# Patient Record
Sex: Female | Born: 1935 | Race: Black or African American | Hispanic: No | Smoking: Never smoker
Health system: Southern US, Community
[De-identification: ages and names within clinical notes are randomized; demographics above are authoritative.]

## PROBLEM LIST (undated history)

## (undated) DIAGNOSIS — IMO0002 Reserved for concepts with insufficient information to code with codable children: Secondary | ICD-10-CM

## (undated) DIAGNOSIS — I272 Pulmonary hypertension, unspecified: Secondary | ICD-10-CM

## (undated) DIAGNOSIS — D649 Anemia, unspecified: Secondary | ICD-10-CM

## (undated) DIAGNOSIS — Q211 Atrial septal defect, unspecified: Secondary | ICD-10-CM

## (undated) DIAGNOSIS — I10 Essential (primary) hypertension: Secondary | ICD-10-CM

## (undated) DIAGNOSIS — M549 Dorsalgia, unspecified: Secondary | ICD-10-CM

## (undated) DIAGNOSIS — R609 Edema, unspecified: Secondary | ICD-10-CM

## (undated) DIAGNOSIS — M109 Gout, unspecified: Secondary | ICD-10-CM

## (undated) DIAGNOSIS — N189 Chronic kidney disease, unspecified: Secondary | ICD-10-CM

## (undated) DIAGNOSIS — R9430 Abnormal result of cardiovascular function study, unspecified: Secondary | ICD-10-CM

## (undated) DIAGNOSIS — M539 Dorsopathy, unspecified: Secondary | ICD-10-CM

## (undated) DIAGNOSIS — K5792 Diverticulitis of intestine, part unspecified, without perforation or abscess without bleeding: Secondary | ICD-10-CM

## (undated) DIAGNOSIS — E039 Hypothyroidism, unspecified: Secondary | ICD-10-CM

## (undated) DIAGNOSIS — G8929 Other chronic pain: Secondary | ICD-10-CM

## (undated) DIAGNOSIS — I639 Cerebral infarction, unspecified: Secondary | ICD-10-CM

## (undated) HISTORY — DX: Hypothyroidism, unspecified: E03.9

## (undated) HISTORY — DX: Abnormal result of cardiovascular function study, unspecified: R94.30

## (undated) HISTORY — PX: ABDOMINAL HYSTERECTOMY: SHX81

## (undated) HISTORY — DX: Essential (primary) hypertension: I10

## (undated) HISTORY — PX: CATARACT EXTRACTION: SUR2

## (undated) HISTORY — DX: Pulmonary hypertension, unspecified: I27.20

## (undated) HISTORY — PX: BREAST SURGERY: SHX581

## (undated) HISTORY — DX: Atrial septal defect, unspecified: Q21.10

## (undated) HISTORY — DX: Reserved for concepts with insufficient information to code with codable children: IMO0002

## (undated) HISTORY — DX: Atrial septal defect: Q21.1

## (undated) HISTORY — DX: Edema, unspecified: R60.9

---

## 2002-08-04 ENCOUNTER — Encounter: Payer: Self-pay | Admitting: Internal Medicine

## 2002-08-04 ENCOUNTER — Ambulatory Visit (HOSPITAL_COMMUNITY): Admission: RE | Admit: 2002-08-04 | Discharge: 2002-08-04 | Payer: Self-pay | Admitting: Internal Medicine

## 2006-04-12 ENCOUNTER — Ambulatory Visit: Payer: Self-pay | Admitting: Cardiology

## 2006-04-19 ENCOUNTER — Encounter: Payer: Self-pay | Admitting: Cardiology

## 2006-04-19 ENCOUNTER — Ambulatory Visit: Payer: Self-pay | Admitting: Cardiology

## 2006-05-18 ENCOUNTER — Ambulatory Visit: Payer: Self-pay | Admitting: Cardiology

## 2006-05-23 ENCOUNTER — Ambulatory Visit: Payer: Self-pay | Admitting: Cardiology

## 2006-06-06 ENCOUNTER — Ambulatory Visit: Payer: Self-pay | Admitting: Cardiology

## 2006-06-25 ENCOUNTER — Ambulatory Visit: Payer: Self-pay | Admitting: Cardiology

## 2006-07-30 ENCOUNTER — Ambulatory Visit: Payer: Self-pay | Admitting: Cardiology

## 2006-08-20 ENCOUNTER — Ambulatory Visit: Payer: Self-pay | Admitting: Cardiology

## 2006-10-02 ENCOUNTER — Ambulatory Visit: Payer: Self-pay | Admitting: Cardiology

## 2006-12-06 ENCOUNTER — Ambulatory Visit: Payer: Self-pay | Admitting: Cardiology

## 2006-12-31 ENCOUNTER — Ambulatory Visit: Payer: Self-pay | Admitting: Cardiology

## 2007-04-01 ENCOUNTER — Ambulatory Visit: Payer: Self-pay | Admitting: Cardiology

## 2007-04-15 ENCOUNTER — Ambulatory Visit: Payer: Self-pay | Admitting: Cardiology

## 2007-04-15 ENCOUNTER — Encounter: Payer: Self-pay | Admitting: Cardiology

## 2007-05-16 ENCOUNTER — Ambulatory Visit: Payer: Self-pay | Admitting: Cardiology

## 2007-07-26 ENCOUNTER — Encounter: Payer: Self-pay | Admitting: Cardiology

## 2007-12-06 ENCOUNTER — Ambulatory Visit: Payer: Self-pay | Admitting: Cardiology

## 2008-08-03 ENCOUNTER — Ambulatory Visit: Payer: Self-pay | Admitting: Cardiology

## 2008-08-07 ENCOUNTER — Encounter: Payer: Self-pay | Admitting: Cardiology

## 2009-02-08 ENCOUNTER — Encounter: Payer: Self-pay | Admitting: Cardiology

## 2009-03-10 ENCOUNTER — Ambulatory Visit: Payer: Self-pay | Admitting: Cardiology

## 2009-05-21 ENCOUNTER — Encounter: Payer: Self-pay | Admitting: Cardiology

## 2009-05-24 ENCOUNTER — Encounter: Payer: Self-pay | Admitting: Cardiology

## 2009-09-29 ENCOUNTER — Ambulatory Visit: Payer: Self-pay | Admitting: Cardiology

## 2010-02-14 ENCOUNTER — Encounter: Payer: Self-pay | Admitting: Cardiology

## 2010-03-10 ENCOUNTER — Ambulatory Visit: Payer: Self-pay | Admitting: Cardiology

## 2010-08-17 ENCOUNTER — Ambulatory Visit: Payer: Self-pay | Admitting: Cardiology

## 2010-10-04 NOTE — Assessment & Plan Note (Signed)
Summary: 6 mo fu reminder-srs   Visit Type:  Follow-up Primary Provider:  Dr. Lucianne Lei   History of Present Illness: The patient is seen for followup of history of a cardiomyopathy in the past.  In 2007 her ejection fraction was 25-30%.  This normalized.  Ejection fraction was 55-60% in August, 2008.  No repeat echo has been done since then.  The patient is doing well.  She follows carefully with Dr.McKay.  He is followed her labs.  She's had some mild edema and she relates to me that he tried to push her diuretics.  He felt it was best to not increase the diuretics.  She has peripheral edema that is probably due to venous insufficiency.  She is 77 and looks 77.  She is active.  She is not having any significant shortness of breath.  She mentions that she feels cool.  Her thyroid is being followed carefully.  Preventive Screening-Counseling & Management  Alcohol-Tobacco     Smoking Status: quit     Year Started: 1946 smoked     Year Quit: 1946  Current Medications (verified): 1)  Alprazolam 0.5 Mg Tabs (Alprazolam) .... Take 1 Tablet By Mouth Two Times A Day 2)  Caltrate 600+d 600-400 Mg-Unit Tabs (Calcium Carbonate-Vitamin D) .... Take 1 Tablet By Mouth Once A Day 3)  Coreg 25 Mg Tabs (Carvedilol) .... Take 1 Tablet By Mouth Twice A Day 4)  Furosemide 40 Mg Tabs (Furosemide) .... Take 1 Tablet By Mouth Twice A Day 5)  Hydralazine Hcl 25 Mg Tabs (Hydralazine Hcl) .... Take 1 Tablet By Mouth Twice A Day 6)  Levothyroxine Sodium 75 Mcg Tabs (Levothyroxine Sodium) .... Take 1 Tablet By Mouth Once A Day 7)  Lisinopril 20 Mg Tabs (Lisinopril) .... Take 1 Tablet By Mouth Once A Day 8)  Multivitamins  Tabs (Multiple Vitamin) .... Take 1 Tablet By Mouth Once A Day  Allergies (verified): No Known Drug Allergies  Comments:  Nurse/Medical Assistant: The patient's medication bottles and allergies were reviewed with the patient and were updated in the Medication and Allergy  Lists.  Past History:  Past Medical History: nonischemic cardiomyopathy ... EF 25-30% in 2007 ... normalized LVF (EF 55-60%) by 2-D echo, 8/08 ... Adenosine Cardiolite, 8/07: fixed antero/antero-apical defect; no definite ischemia, consistent with NICM Pulmonary hypertension Small ASD HTN Chronic LE edema...venous insufficiency Hypothyroidism  Social History: Smoking Status:  quit  Review of Systems       Patient denies fever, chills, headache, sweats, rash, change in vision, change in hearing, chest pain, cough, nausea vomiting, urinary symptoms.  All other systems are reviewed and are negative  Vital Signs:  Patient profile:   77 year old female Height:      65 inches Weight:      166 pounds Pulse rate:   71 / minute BP sitting:   135 / 71  (left arm) Cuff size:   regular  Vitals Entered By: Carlye Grippe (March 10, 2010 2:14 PM)  Physical Exam  General:  patient looks quite stable. Eyes:  no xanthelasma. Neck:  no jugular venous distention. Lungs:  lungs are clear.  Respiratory effort is nonlabored. Heart:  cardiac exam reveals S1-S2.  No clicks or significant murmurs. Abdomen:  abdomen is soft. Extremities:  the patient does have 1+ peripheral edema. Psych:  patient is oriented to person time and place.  Affect is normal.   Impression & Recommendations:  Problem # 1:  SHORTNESS OF BREATH (ICD-786.05)  Her updated medication list for this problem includes:    Coreg 25 Mg Tabs (Carvedilol) .Marland Kitchen... Take 1 tablet by mouth twice a day    Furosemide 40 Mg Tabs (Furosemide) .Marland Kitchen... Take 1 tablet by mouth twice a day    Lisinopril 20 Mg Tabs (Lisinopril) .Marland Kitchen... Take 1 tablet by mouth once a day The patient is not having any significant shortness of breath.  No change in her meds.  Problem # 2:  HYPERTENSION (ICD-401.9)  Her updated medication list for this problem includes:    Coreg 25 Mg Tabs (Carvedilol) .Marland Kitchen... Take 1 tablet by mouth twice a day    Furosemide 40 Mg  Tabs (Furosemide) .Marland Kitchen... Take 1 tablet by mouth twice a day    Hydralazine Hcl 25 Mg Tabs (Hydralazine hcl) .Marland Kitchen... Take 1 tablet by mouth twice a day    Lisinopril 20 Mg Tabs (Lisinopril) .Marland Kitchen... Take 1 tablet by mouth once a day  Orders: EKG w/ Interpretation (93000) Blood pressure is slightly elevated.  I've chosen not to change her medicines today.  Problem # 3:  * CARDIOMYOPATHY IMPROVED  Orders: EKG w/ Interpretation (93000) Fortunately her cardiomyopathy improved.  Last echo was done in 2008.  I do not think that P. echo will be helpful at this time.  EKG is done today reviewed by me.  She has normal sinus rhythm.  There scattered PVCs.  No further workup indicated.  Problem # 4:  HYPOTHYROIDISM (ICD-244.9)  Her updated medication list for this problem includes:    Levothyroxine Sodium 75 Mcg Tabs (Levothyroxine sodium) .Marland Kitchen... Take 1 tablet by mouth once a day Patient's thyroid status is being treated.  Problem # 5:  * CHRONIC LEG EDEMA There is chronic mild edema.  This is from venous insufficiency.  No change in therapy.  Patient Instructions: 1)  Your physician wants you to follow-up in: 6 months. You will receive a reminder letter in the mail one-two months in advance. If you don't receive a letter, please call our office to schedule the follow-up appointment. 2)  Your physician recommends that you continue on your current medications as directed. Please refer to the Current Medication list given to you today.

## 2010-10-04 NOTE — Assessment & Plan Note (Signed)
Summary: 6 MONTH FU RECV LETTER VS   Visit Type:  Follow-up Primary Provider:  Dr. Lucianne Lei  CC:  shortness of breath.  History of Present Illness: [name] is seen for cardiology followup.  She has decreased left ventricular function by history but this appeared to have normalized in August 2008.  She's doing well.  He is fully active.  She is not having chest pain.  She does have some exertional shortness of breath but this has not changed significantly.  Preventive Screening-Counseling & Management  Alcohol-Tobacco     Smoking Status: quit > 6 months  Comments: [name] states she tried smoking for about 6 months when she was 22 but never smoked regularly.  Current Medications (verified): 1)  Alprazolam 0.5 Mg Tabs (Alprazolam) .... Take 1 Tablet By Mouth As Needed 2)  Calcium 500 Mg Tabs (Calcium Carbonate) .... Take 1 Tablet By Mouth Once A Day 3)  Coreg 25 Mg Tabs (Carvedilol) .... Take 1 Tablet By Mouth Twice A Day 4)  Furosemide 40 Mg Tabs (Furosemide) .... Take 1 Tablet By Mouth Twice A Day 5)  Hydralazine Hcl 25 Mg Tabs (Hydralazine Hcl) .... Take 1 Tablet By Mouth Twice A Day 6)  Levothyroxine Sodium 75 Mcg Tabs (Levothyroxine Sodium) .... Take 1 Tablet By Mouth Once A Day 7)  Lisinopril 20 Mg Tabs (Lisinopril) .... Take 1 Tablet By Mouth Once A Day 8)  Multivitamins  Tabs (Multiple Vitamin) .... Take 1 Tablet By Mouth Once A Day  Comments:  Nurse/Medical Assistant: The patient's medications were reviewed with the patient and were updated in the Medication List. [name] brought medication bottles to office visit.  Cyril Loosen, RN, BSN (September 29, 2009 1:02 PM)  Past History:  Past Medical History: Last updated: 09/29/2009 nonischemic cardiomyopathy ... EF 25-30% in 2007 ... normalized LVF (EF 55-60%) by 2-D echo, 8/08 ... Adenosine Cardiolite, 8/07: fixed antero/antero-apical defect; no definite ischemia, consistent with NICM Pulmonary hypertension Small ASD HTN Chronic  LE edema Hypothyroidism  Social History: Smoking Status:  quit > 6 months  Review of Systems       Patient denies fever, chills, headache, sweats, rash, change in vision, change in hearing, chest pain, cough, nausea vomiting, urinary symptoms.  All other systems are reviewed and are negative.  Vital Signs:  Patient profile:   77 year old female Height:      65 inches Weight:      165 pounds BMI:     27.56 Pulse rate:   67 / minute BP sitting:   159 / 79  (left arm) Cuff size:   regular  Vitals Entered By: Cyril Loosen, RN, BSN (September 29, 2009 12:59 PM)  Nutrition Counseling: Patient's BMI is greater than 25 and therefore counseled on weight management options. CC: shortness of breath Comments [name] states she had one episode of chest pain but she had a cold and primary MD told her it may be from coughing.   Physical Exam  General:  patient is stable in general. Eyes:  no xanthelasma. Neck:  no jugular venous distention. Lungs:  lungs are clear.  Respiratory effort is nonlabored. Heart:  cardiac exam reveals S1-S2.  No clicks or significant murmurs. Abdomen:  abdomen is soft. Extremities:  no peripheral edema. Psych:  patient is oriented to person time and place.  Affect is normal.   Impression & Recommendations:  Problem # 1:  HYPERTENSION (ICD-401.9)  Her updated medication list for this problem includes:    Coreg 25  Mg Tabs (Carvedilol) .Marland Kitchen... Take 1 tablet by mouth twice a day    Furosemide 40 Mg Tabs (Furosemide) .Marland Kitchen... Take 1 tablet by mouth twice a day    Hydralazine Hcl 25 Mg Tabs (Hydralazine hcl) .Marland Kitchen... Take 1 tablet by mouth twice a day    Lisinopril 20 Mg Tabs (Lisinopril) .Marland Kitchen... Take 1 tablet by mouth once a day Hypertension is under control.  No further workup.  Problem # 2:  * CARDIOMYOPATHY IMPROVED historically the patient's LV function improved.  I'm not inclined to recommend an echo at this time.  Problem # 3:  SHORTNESS OF BREATH  (ICD-786.05)  Her updated medication list for this problem includes:    Coreg 25 Mg Tabs (Carvedilol) .Marland Kitchen... Take 1 tablet by mouth twice a day    Furosemide 40 Mg Tabs (Furosemide) .Marland Kitchen... Take 1 tablet by mouth twice a day    Lisinopril 20 Mg Tabs (Lisinopril) .Marland Kitchen... Take 1 tablet by mouth once a day Patient is having some mild shortness of breath.  She seems stable and I feel no further workup is needed.  Other Orders: EKG w/ Interpretation (93000)  Patient Instructions: 1)  Your physician recommends that you continue on your current medications as directed. Please refer to the Current Medication list given to you today. 2)  Your physician wants you to follow-up in:  6months. You will receive a reminder letter in the mail about two months in advance. If you don't receive a letter, please call our office to schedule the follow-up appointment.

## 2010-10-06 NOTE — Assessment & Plan Note (Signed)
Summary: 6 MOF U PER JAN REMINDER   Visit Type:  Follow-up Primary Provider:  Dr. Lucianne Lei  CC:  cardiomyopathy.  History of Present Illness: The patient looks great.  She had left ventricular dysfunction in the past improved.  Her last echo was in 2008 with an EF of 50%.  She is on appropriate medications.  Her systolic blood pressure is elevated today.  He was not elevated when she saw her primary physician very recently.  She will see him again for early followup.  Preventive Screening-Counseling & Management  Alcohol-Tobacco     Smoking Status: never  Current Medications (verified): 1)  Alprazolam 0.5 Mg Tabs (Alprazolam) .... Take 1 Tablet By Mouth Two Times A Day 2)  Caltrate 600+d 600-400 Mg-Unit Tabs (Calcium Carbonate-Vitamin D) .... Take 1 Tablet By Mouth Once A Day 3)  Coreg 25 Mg Tabs (Carvedilol) .... Take 1 Tablet By Mouth Twice A Day 4)  Furosemide 40 Mg Tabs (Furosemide) .... Take 1 Tablet By Mouth Twice A Day 5)  Hydralazine Hcl 25 Mg Tabs (Hydralazine Hcl) .... Take 1 Tablet By Mouth Twice A Day 6)  Levothyroxine Sodium 75 Mcg Tabs (Levothyroxine Sodium) .... Take 1 Tablet By Mouth Once A Day 7)  Lisinopril 20 Mg Tabs (Lisinopril) .... Take 1 Tablet By Mouth Once A Day 8)  Multivitamins  Tabs (Multiple Vitamin) .... Take 1 Tablet By Mouth Once A Day  Allergies (verified): No Known Drug Allergies  Comments:  Nurse/Medical Assistant: The patient is currently on medications but does not know the name or dosage at this time. Instructed to contact our office with details. Will update medication list at that time.  Past History:  Past Medical History: nonischemic cardiomyopathy ... EF 25-30% in 2007 ... normalized LVF (EF 55-60%) by 2-D echo, 8/08 ... Adenosine Cardiolite, 8/07: fixed antero/antero-apical defect; no definite ischemia, consistent with NICM Pulmonary hypertension Small ASD HTN Chronic LE edema...venous insufficiency.. Hypothyroidism  Social  History: Smoking Status:  never  Review of Systems       Patient denies fever, chills, headache, sweats, rash, change in vision, change in hearing, chest pain, cough, nausea vomiting, urinary symptoms.  All other systems are reviewed and are negative.  Vital Signs:  Patient profile:   77 year old female Height:      65 inches Weight:      165 pounds BMI:     27.56 Pulse rate:   65 / minute BP sitting:   167 / 77  (left arm) Cuff size:   regular  Vitals Entered By: Carlye Grippe (August 17, 2010 1:15 PM)  Physical Exam  General:  she looks good today. Eyes:  no xanthelasma. Neck:  no jugular venous distention. Lungs:  lungs are clear.  Respiratory effort is nonlabored. Heart:  cardiac exam reveals S1 and S2.  No clicks or significant murmurs. Abdomen:  abdomen is soft. Extremities:  no peripheral edema. Psych:  patient is oriented to person time and place.  Affect is normal.   Impression & Recommendations:  Problem # 1:  * CHRONIC LEG EDEMA Is no significant edema.  She is doing well.  Problem # 2:  SHORTNESS OF BREATH (ICD-786.05)  Her updated medication list for this problem includes:    Coreg 25 Mg Tabs (Carvedilol) .Marland Kitchen... Take 1 tablet by mouth twice a day    Furosemide 40 Mg Tabs (Furosemide) .Marland Kitchen... Take 1 tablet by mouth twice a day    Lisinopril 20 Mg Tabs (Lisinopril) .Marland Kitchen... Take  1 tablet by mouth once a day She has no shortness of breath.  No change in therapy.  Problem # 3:  * CARDIOMYOPATHY IMPROVED In the past she had LV dysfunction.  This normalized.  I decided it is not needed to repeat an echo at this point.  Problem # 4:  HYPERTENSION (ICD-401.9)  Her updated medication list for this problem includes:    Coreg 25 Mg Tabs (Carvedilol) .Marland Kitchen... Take 1 tablet by mouth twice a day    Furosemide 40 Mg Tabs (Furosemide) .Marland Kitchen... Take 1 tablet by mouth twice a day    Hydralazine Hcl 25 Mg Tabs (Hydralazine hcl) .Marland Kitchen... Take 1 tablet by mouth twice a day     Lisinopril 20 Mg Tabs (Lisinopril) .Marland Kitchen... Take 1 tablet by mouth once a day Systolic blood pressure is elevated today.  She will follow up with her primary physician.we'll see back in 6 months.  Patient Instructions: 1)  Your physician recommends that you continue on your current medications as directed. Please refer to the Current Medication list given to you today. 2)  Follow up in  6 months

## 2011-01-17 NOTE — Assessment & Plan Note (Signed)
[facility]                          [facility] NOTE   NAME:Embree, LACHINA SALSBERRY                     MRN:          [id number]  DATE:08/03/2008                            DOB:          06-19-1925    Ms. Occhipinti looks great.  She had a weekend shopping spree and had her  feet down while riding in a bus.  She may have gained a little bit of  fluid weight, although she is stable.  She is not having chest pain or  shortness of breath.  She has a cardiomyopathy and this is stable.  She  has significant valvular disease.  Overall though, she is doing very  well.  She is on the appropriate medications.   PAST MEDICAL HISTORY:   ALLERGIES:  No known drug allergies.   MEDICATIONS:  See the flow sheet.   REVIEW OF SYSTEMS:  She is not having any GI or GU symptoms.  She has no  fevers or chills or musculoskeletal pain.  Review of systems otherwise  is negative.   PHYSICAL EXAMINATION:  VITAL SIGNS:  Blood pressure 126/69 with a pulse  of 65.  Weight is 168 pounds.  GENERAL:  The patient is oriented to person, time, and place.  Affect is  normal.  HEENT:  No xanthelasma.  She has normal extraocular motion.  NECK:  There are no carotid bruits.  There is no jugular venous  distention.  LUNGS:  Clear.  Respiratory effort is not labored.  CARDIAC:  An S1 with an S2.  There is a 2/6 murmur.  ABDOMEN:  Soft.  EXTREMITIES:  She has trace peripheral edema.   EKG shows sinus rhythm.   PROBLEMS:  1. Nonischemic cardiomyopathy in the past.  However, her ejection      fraction improved to 55-60%.  2. Mild mitral regurgitation, and then in followup, she had only trace      mitral regurgitation.  3. Moderate tricuspid regurgitation which then improved to mild.  4. History of pulmonary hypertension which also appeared to improve.  5. History of a small atrial septal defect.  6. Mild edema, stable.  7. Hypertension, treated.   Her cardiac status is  stable.  She needs no change in her meds and no  further workup.  I will see her in 6 months.     Luis Abed, MD, [facility]  Electronically Signed    JDK/MedQ  DD: 08/03/2008  DT: 08/04/2008  Job #: [id number]   cc:   Dorise Hiss, M.D.

## 2011-01-17 NOTE — Assessment & Plan Note (Signed)
[facility]                          [facility] NOTE   NAME:Quizon, LEZLY RUMPF                     MRN:          [id number]  DATE:04/01/2007                            DOB:          1924/10/11    HISTORY OF PRESENT ILLNESS:  Ms. Shain is doing well.  She was seen  last in the office by Dr. Andee Lineman on December 31, 2006.  He adjusted her  medications further, and she is now back for follow up.  She did have a  B-met after that visit.  Her creatinine was 1 and her BUN was 17.  She  has a cardiomyopathy.  She is now on Coreg 25 b.i.d., Lisinopril 20 and  hydralazine.  Her Lasix dose is 40 on some days and 20 on other days  depending on her volume status.  Her weight has decreased somewhat since  her last visit.  She still has some peripheral edema, but I believe that  this will be her baseline.   PAST MEDICAL HISTORY:   ALLERGIES:  No known drug allergies.   MEDICATIONS:  1. Hydralazine 25 b.i.d.  2. Thyroid 75.  3. Calcium.  4. Lasix 40 on  most days and 20 on other days.  5. Coreg 25 mg b.i.d.  6. Lisinopril 20.   OTHER MEDICAL PROBLEMS:  See the list below.   REVIEW OF SYSTEMS:  Overall, she feels well.  She is not having  significant symptoms at this time.  When I mentioned to her that her  weight was somewhat decreased she was actually concerned.  Her diet  appears to be good.  Otherwise, her review of systems is negative.   PHYSICAL EXAMINATION:  VITAL SIGNS:  Blood pressure today is 123/65,  pulse 59, weight 166.8 pounds.  GENERAL:  The patient is oriented to person, place and time.  Affect is  normal.  She is comfortable in the room at this time.  HEENT:  No xanthelasma.  She has normal extraocular muscle.  There are  no carotid bruits.  There is no jugular venous distention.  LUNGS:  Clear.  RESPIRATORY:  Effort is not labored.  CARDIAC:  S1, S2.  There are no clicks or significant murmurs.  ABDOMEN:  Soft.  Normal bowel  sounds.  She has 1+ edema in her left leg.  She does have chronic venous insufficiency in this leg.  There is trace  edema in her right leg.   PROBLEMS:  1. History of nonischemic cardiomyopathy.  EF has been as low as 25 in      the past.  Her medications have now been adjusted, and it is time      for her to have a follow up 2D echo.  2. Moderate mitral regurgitation.  3. Mild to moderate tricuspid regurgitation.  4. Pulmonary hypertension.  5. Small atrial septal defect.  6. Mild continued edema.  I will not be changing her medications.  7. Hypertension.  This is controlled at this time.  8. Weight.  This is now stable.  9. She did have a nonischemic Cardiolite in August 2007.  The patient will now have a follow up 2D echo, and then I will see her  back in follow up.     Luis Abed, MD, [facility]  Electronically Signed    JDK/MedQ  DD: 04/01/2007  DT: 04/02/2007  Job #: [id number]   cc:   Dorise Hiss, M.D.

## 2011-01-17 NOTE — Assessment & Plan Note (Signed)
[facility]                          [facility] NOTE   NAME:Selkirk, [name]                     MRN:          [id number]  DATE:03/10/2009                            DOB:          01-Jun-1925    PRIMARY CARDIOLOGIST:  Luis Abed, MD, [facility]   REASON FOR VISIT:  A 18-month followup.   [name] denies any interim development of signs or symptoms  suggestive of decompensated heart failure.  In fact, she states that she  is feeling great.  Specifically, she denies any worsening of her mild,  chronic exertional dyspnea.  She denies any PND, orthopnea, or  exacerbation of her mild lower extremity edema.   [name] is on supplemental thyroid, followed by Dr. Anne Hahn.  She  refers to feeling cold.  Her last TSH in December 2009 was within  normal limits.   REVIEW OF SYSTEMS:  Denies chest pain or tachypalpitations.  Remaining  systems reviewed, and are negative.   CURRENT MEDICATIONS:  1. Lisinopril 20 daily.  2. Hydralazine 25 b.i.d.  3. Levothyroxine 0.075 daily.  4. Coreg 25 b.i.d.  5. Furosemide 40 b.i.d.   PHYSICAL EXAMINATION:  VITAL SIGNS:  Blood pressure 161/75, pulse 62 and  regular, and weight 164 (down 4).  GENERAL:  An 77 year old female sitting upright in no distress.  HEENT:  Normocephalic and atraumatic.  NECK:  Palpable carotid pulses without bruits; no JVD at 90 degrees.  LUNGS:  Clear to auscultation in all fields.  HEART:  Regular rate and rhythm.  A grade 2/6 holosystolic murmur heard  loudest at the base.  No diastolic blow.  ABDOMEN:  Soft and nontender.  EXTREMITIES:  Trace edema.  NEUROLOGIC:  No focal deficit.   IMPRESSION:  1. Nonischemic cardiomyopathy.      a.     History of EF 25-30% in 2007.      b.     Normalized LVF (EF 55-60%), by 2-D echo, August 2008.  2. History of pulmonary hypertension.  3. History of small atrial septal defect.  4. Hypertension, uncontrolled.  5. Mild, chronic lower  extremity edema.  6. Hypothyroidism.   PLAN:  1. The patient is advised to follow up with Dr. Lucianne Lei in the      near future, as scheduled.  We will defer to Dr. Anne Hahn with respect      to her current complaint of feeling cold, and whether or not this      is related to her treated hypothyroidism.  We also defer to him      with respect to ongoing monitoring and management of her      hypertension.  2. No cardiac workup recommended at this point in time.  We will      arrange a return clinic follow up with Dr. Myrtis Ser in 6 months.      Rozell Searing, PA-C  Electronically Signed      Luis Abed, MD, [facility]  Electronically Signed   GS/MedQ  DD: 03/10/2009  DT: 03/11/2009  Job #: [id number]   cc:   Lucianne Lei, MD

## 2011-01-17 NOTE — Assessment & Plan Note (Signed)
[facility]                          [facility]   NAME:[name]                     MRN:          [id number]  DATE:05/16/2007                            DOB:          1924-10-15    [name] is here for cardiology followup.   She is doing very well.  I had seen her last on April 01, 2007.  She had  a nonischemic cardiomyopathy and we had adjusted her medicines very  carefully over time.  She had a nonischemic Cardiolite in the past.  Two-  dimensional echo was ordered to reassess her LV function and it was done  on April 15, 2007.  I am pleased to see that she has had return of  excellent systolic function.  Her ejection fraction was estimated at 55-  60%.  She has a grade 1 diastolic dysfunction.  There is trace mitral  regurgitation.  She has an atrial septal aneurysm of no clinical  significance.  Today, she returns for followup, and she is feeling well.  I have shared this good information with her and of course, she is quite  pleased.   She is not having any chest pain or significant shortness of breath.  She has had no syncope or presyncope.   PAST MEDICAL HISTORY:   ALLERGIES:  The patient has no known drug allergies.  She was told in the past that she did not need aspirin and she does not  at this point.   MEDICATIONS:  1. Lisinopril 20.  2. Lasix 40.  3. Hydralazine 25 b.i.d.  4. Levothyroxine 75 mcg.  5. Coreg 25 b.i.d.  6. Calcium.   OTHER MEDICAL PROBLEMS:  See the list below.   REVIEW OF SYSTEMS:  Today, she feels well.  She has no significant  complaints.   PHYSICAL EXAMINATION:  VITAL SIGNS:  Blood pressure is 128/50.  Pulse is  65.  NEUROLOGIC:  The patient is oriented to person, time, and place.  Affect  is normal.  HEENT:  Reveals no xanthelasma.  She has normal extraocular motion.  NECK:  There are no carotid bruits.  There is no jugular venous  distention.  LUNGS:  Clear.  Respiratory  effort is not labored.  CARDIAC:  Reveals an S1 with an S2.  There are no clicks or significant  murmurs.  ABDOMEN:  Soft.  She has normal bowel sounds.  EXTREMITIES:  There is trace peripheral edema that is chronic.   EKG today reveals some scattered PVCs with no significant changes.   PROBLEMS:  1. History of a nonischemic cardiomyopathy.  Her ejection fraction has      been as low as 25% in the past.  Currently her ejection fraction      has normalized to the 55-60% range and she is doing well.  I will      not be changing her medicines.  2. Moderate mitral regurgitation by history which has now improved to      only trace mitral regurgitation.  3. Moderate tricuspid regurgitation which is now improved to mild  tricuspid regurgitation.  4. History of some pulmonary hypertension.  The pulmonary artery      pressures appear to have normalized at the time of her last      echocardiogram.  5. History of a small atrial septal defect.  6. Mild persistent trivial edema which is chronic and no further      therapy is needed.  7. Hypertension, treated.  8. Nonischemic Cardiolite in August 2007.   We are all pleased about the improvement in her left ventricular  function.  There will be no change in her medicines.  I will see her  back in 6 months for followup.     Luis Abed, MD, [facility]  Electronically Signed    JDK/MedQ  DD: 05/16/2007  DT: 05/16/2007  Job #: [id number]   cc:   Dorise Hiss, M.D.

## 2011-01-17 NOTE — Assessment & Plan Note (Signed)
[facility]                          [facility]   NAME:[name], [name]                     MRN:          [id number]  DATE:12/06/2007                            DOB:          Jul 13, 1925    [name] is delightful 77 year old lady who is doing very well.  She  had a nonischemic cardiomyopathy and was treated with medications, and  over time, she had return of excellent LV function.  She goes about full  activities.  She is quite alert and very pleasant.   She is not having any chest pain or shortness of breath.   PAST MEDICAL HISTORY:   ALLERGIES:  No known drug allergies, although there is question of an  ASPIRIN allergy.   MEDICATIONS:  Lisinopril 20, Lasix 40 hydralazine 25 b.i.d., thyroxine  75 mcg, Coreg 25 b.i.d., calcium, and alprazolam as needed.   OTHER MEDICAL PROBLEMS:  See the list on my note of May 16, 2007.   REVIEW OF SYSTEMS:  She is doing well and her review of systems is  negative.   PHYSICAL EXAM:  Blood pressure 130/73.  Pulse is 59.  The patient is oriented to person, time and place.  Her affect is  normal.  HEENT:  Reveals no xanthelasma.  She has normal extraocular motion.  There are no carotid bruits.  There is no jugular venous distention.  Lungs are clear.  Respiratory effort is not labored.  Cardiac exam reveals S1-S2.  There are no clicks or significant murmurs.  The abdomen is soft.  There are no masses or bruits.  She has trivial peripheral edema and this is stable for her.   No labs were done today.   PROBLEMS:  Listed on my note of May 16, 2007.  She is doing  extremely well.  There is no need to change her medicines.  I will see  her in follow-up in 6 months.     Luis Abed, MD, [facility]  Electronically Signed    JDK/MedQ  DD: 12/06/2007  DT: 12/06/2007  Job #: [id number]   cc:   Dorise Hiss, M.D.

## 2011-01-20 NOTE — Assessment & Plan Note (Signed)
[facility]                            [facility]   NAME:Tirone, GIABELLA                       MRN:          [id number]  DATE:07/30/2006                            DOB:          1925/06/21    Ms. Prien is doing well.  I saw her last on June 25, 2006.  She is not  having any chest pain.  If anything, her shortness of breath is slightly  improved.  We had put her on her Lasix every day.  Also, Coreg was increased  to 12.5.  She is tolerating this well.  She thinks she may be slightly  better.   PAST MEDICAL HISTORY:  Allergies:  No known drug allergies.   Medications:  1. Hydralazine 25 b.i.d.  2. Thyroxine 75 mcg.  3. Lasix 20.  4. Coreg 12.5 b.i.d.   Other medical problems:  See the complete list on my note of June 25, 2006.   REVIEW OF SYSTEMS:  She has some very slight chest discomfort at times.  It  is not a major issue.  Otherwise, her review of systems is negative.   PHYSICAL EXAMINATION:  VITAL SIGNS:  Are recorded in the chart.  GENERAL:  The patient is oriented to person, time and place.  Her affect is  normal.  She is here with her sister.  LUNGS:  Reveal no rales.  She has no respiratory distress.  NECK:  There are no carotid bruits.  There is no jugular venous distention.  HEENT:  Reveals no xanthelasma.  She has normal extraocular motion.  CARDIAC:  Reveals an S1 with an S2.  There are no significant murmurs.  ABDOMEN:  Soft.  The patient has trivial peripheral edema.   PROBLEMS:  Are listed on my note of June 25, 2006.  Her fatigue and  shortness of breath is slightly improved.  It is from her LV dysfunction.  Her Coreg dose was recently increased and she is stable.  I will not be  changing her dosing at this time.   No change in her medications today.  I will see her back in an additional 3  weeks.     Luis Abed, MD, [facility]  Electronically Signed    JDK/MedQ  DD: 07/30/2006  DT:  07/30/2006  Job #: [id number]   cc:   Dorise Hiss, M.D.

## 2011-01-20 NOTE — Assessment & Plan Note (Signed)
[facility]                          [facility]   NAME:[name], [name] NORBY                     MRN:          [id number]  DATE:10/02/2006                            DOB:          July 28, 1925    PRIMARY CARDIOLOGIST:  Dr. Willa Rough.   REASON FOR VISIT:  Scheduled 6-week followup.  Please refer to my clinic  note of December 17 for full details.   The patient reports some mild, palpable, improvement in her overall  exercise tolerance capacity.  However, she still has significant  exertional dyspnea absent of any associated PND, orthopnea, or lower  extremity edema.  When last seen here in the clinic, we adjusted  medications with the addition of lisinopril 5 daily.  A followup BMET in  1 week revealed stable renal function with a creatinine of 1.0, BUN of  20, and potassium of 3.8.   The patient reports no exertional chest discomfort, but does have  occasional atypical pains, which are described as twinges.  An  adenosine stress Cardiolite in August 2007 was consistent with  nonischemic cardiomyopathy.   Of note, the patient does have significant dyspnea with walking up a  flight of stairs.   CURRENT MEDICATIONS:  1. Coreg 12.5 b.i.d.  2. Lisinopril 5 daily.  3. Lasix 20 daily.  4. Levothyroxine 0.075 daily.  5. Hydralazine 25 b.i.d.   REVIEW OF SYSTEMS:  As noted per HPI.  Remaining systems negative.   PHYSICAL EXAM:  Blood pressure 148/78, pulse 64 and regular, weight  166.4 (unchanged).  GENERAL:  A 77 year old female sitting upright in no distress.  NECK:  Palpable bilateral carotid pulses with jugular venous distension  at approximately 7 cm at 90 degrees.  LUNGS:  Clear to auscultation in all fields.  HEART:  Regular rate and rhythm (S1, S2), positive S4.  A soft grade 2/6  systolic ejection murmur at the base.  EXTREMITIES:  1+ bilateral pedal edema.  NEURO:  No focal deficits.   IMPRESSION:  1. Nonischemic  cardiomyopathy.      a.     Ejection fraction 25-30% by 2D echo August 2007.      b.     Nonischemic adenosine stress Cardiolite August 2007.  2. Exertional dyspnea.      a.     New York Heart Association class II-III heart failure.  3. Valvular heart disease.      a.     Moderate mitral regurgitation and tricuspid regurgitation.  4. Moderate pulmonary hypertension.  5. History of small atrial septal defect/perforation.      a.     Associated with left-right shunt flow.  6. Mild chronic lower extremity edema.   PLAN:  1. Continue medication regimen titration with increase in lisinopril      dose to 5 b.i.d.  2. Check a followup BMET in 1 week.  3. Consider up-titration of Coreg when next seen in the clinic.  Goal      is to maximize medical therapy and then a repeat 2D echocardiogram      for reassessment of left ventricular function.  If the patient      demonstrates persistent LV dysfunction, then consideration may be      given to discussing more aggressive treatment options (i.e.,      defibrillator implantation).  4. Schedule return clinic followup with myself and Dr. Myrtis Ser in 2      months.      Rozell Searing, PA-C  Electronically Signed      Luis Abed, MD, [facility]  Electronically Signed   GS/MedQ  DD: 10/02/2006  DT: 10/02/2006  Job #: [id number]   cc:   Dorise Hiss, M.D.

## 2011-01-20 NOTE — Assessment & Plan Note (Signed)
[facility]                            [facility]   NAME:[name], [name]                     MRN:          [id number]  DATE:05/23/2006                            DOB:          [birth year]    PRIMARY CARDIOLOGIST:  Luis Abed, MD, [facility]   REASON FOR OFFICE VISIT:  Scheduled office followup.  Please refer to Dr.  Henrietta Hoover office note of September 14th for full details.   At that time, the patient presented in followup of recent noninvasive  testing consisting of a 2D echocardiogram and a pharmacologic stress test.  This was following an initial evaluation by Dr. Myrtis Ser on August 9th when  patient presented as a new consultation for evaluation of dyspnea and chest  pain.  She had no prior cardiac history but had multiple cardiac risk  factors.   The 2D echocardiogram was suggestive of nonischemic cardiomyopathy with an  EF of 25-30% with global hypokinesis and evidence of moderate pulmonary  hypertension.   The stress test showed no definite evidence of ischemia and was also  suggestive of a nonischemic cardiomyopathy.   The patient was thus started on Coreg 3.125 b.i.d. and was also instructed  to start taking low dose Lasix on a daily basis.  Since this adjustment of  her medication regimen only five days ago, patient already notes palpable  improvement with respect to dyspnea at rest (i.e., sitting); however, she  still has some significant exertional dyspnea with walking on level ground  but continues to report no PND or orthopnea.  She has mild chronic lower  extremity edema which has remained stable.   CURRENT MEDICATIONS:  1. Coreg 3.125 b.i.d.  2. Lasix 20 daily.  3. Levothyroxine 0.075 daily.  4. Hydralazine 25 b.i.d.   PHYSICAL EXAMINATION:  VITAL SIGNS:  Blood pressure 110/70, pulse 80,  regular.  Weight 169 (unchanged).  NECK:  Palpable carotid pulses without bruits.  No JVD at 90 degrees.  LUNGS:  Clear to  auscultation in all fields.  HEART:  Regular rate and rhythm (S1 and S2).  Soft S4 with no definite  murmurs.  EXTREMITIES:  Bilateral nonpitting extremity edema.  NEURO:  No focal deficits.   IMPRESSION:  1. Severe nonischemic cardiomyopathy.      a.     Ejection fraction 25-30% with global hypokinesis by recent       echocardiogram.  2. Valvular heart disease.      a.     Moderate mitral regurgitation/tricuspid regurgitation.  3. Moderate pulmonary hypertension.  4. Hypothyroidism.  5. Chronic lower extremity edema.   PLAN:  1. Up-titrate Coreg to 6.25 b.i.d.  2. Check follow-up BMET, given that patient is currently on daily      maintenance Lasix.  3. A two week clinic followup with the RN for monitoring of blood      pressure/pulse.  4. Return clinic followup with Dr. Willa Rough in one month.  Gene Serpe, PA-C                                Learta Codding, MD,FACC   GS/MedQ  DD:  05/23/2006  DT:  05/24/2006  Job #:  [id number]

## 2011-01-20 NOTE — Assessment & Plan Note (Signed)
[facility]                          [facility]   NAME:Younes, RYLA CAUTHON                     MRN:          [id number]  DATE:08/20/2006                            DOB:          [birth year]    PRIMARY CARDIOLOGIST:  Luis Abed, MD, [facility]   REASON FOR OFFICE VISIT:  Scheduled 3-week followup.   Ms. Wiegand is a very pleasant 77 year old female, with history of severe  non-ischemic cardiomyopathy (EF 25% to 30%), who now returns for  continued close monitoring and adjustment of her heart failure  medication regimen. At time of her last clinic visit, however, she was  discharged on the same medication regimen and was thus kept on the up-  titrated Coreg dose of 12.5 b.i.d.   The patient reports to me today, however, that she continues to have  significant exertional dyspnea with no real palpable improvement since  we began up-titrating her Coreg dose these past several months. However,  she reports no PND or orthopnea, nor any exacerbation of her chronic  lower extremity edema. She denies any exertional chest pain and has  occasional palpitations. She can climb a flight of stairs, albeit  slowly, and with significant associated dyspnea.   CURRENT MEDICATIONS:  Unchanged from previous visit of November 26th.   PHYSICAL EXAMINATION:  Blood pressure 150/86; pulse is 62, regular.  Weight 166 (unchanged).  GENERAL: A 77 year old female, sitting upright, in no distress.  NECK: Palpable carotid pulse without bruit; no JVD at 90-degrees.  LUNGS:  Clear to auscultation in all fields.  HEART: Regular rate and rhythm (S1, S2). A harsh grade 2/6 holosystolic  murmur along the base.  EXTREMITIES: Palpable dorsalis pedis pulses with 1 to 2+ pedal edema.  NEURO: No focal deficit.   IMPRESSION:  1. Severe cardiomyopathy.      a.     Presumed non-ischemic.      b.     Ejection fraction 25% to 30% by echocardiogram August of       2007.  c.     Non-ischemic adenosine stress Cardiolite August 2007.  2. Exertional dyspnea.  3. Valvular heart disease.      a.     Moderate mitral regurgitation/tricuspid regurgitation.  4. Moderate pulmonary hypertension.  5. History of small atrial septal defect/perforation.      a.     Associated with left-right shunt flow.  6. Chronic lower extremity edema, stable.   PLAN:  At this juncture, we will challenge Ms. Weiland with the addition  of an ACE inhibitor for continued management of her cardiomyopathy. She  has never been on this class of medications and has no contraindications  to such.  Will start her on lisinopril 5 daily and check a followup BMET  in 1 week. We will then plan on having her return to the clinic for  continued followup in approximately 6 weeks with Dr. Willa Rough and  myself.      Rozell Searing, PA-C  Electronically Signed      Luis Abed, MD, [facility]  Electronically Signed   GS/MedQ  DD: 08/20/2006  DT: 08/20/2006  Job #: [id number]   cc:   Dorise Hiss, M.D.

## 2011-01-20 NOTE — Assessment & Plan Note (Signed)
[facility]                          [facility]   NAME:[name], [name]                     MRN:          [id number]  DATE:12/31/2006                            DOB:          14-Apr-1925    REFERRING PHYSICIAN:  Dorise Hiss, M.D.   HISTORY OF PRESENT ILLNESS:  The patient is a 77 year old female with a  nonischemic cardiomyopathy and significant mitral regurgitation.  The  patient was seen by Dr. Myrtis Ser on December 06, 2006.  She is currently an NYHA  class 2B.  Her Coreg was increased during that office visit and has now  noticed some slight increase in edema and fatigue.  She denies any chest  pain, orthopnea, PND.  She has no palpitations or syncope.   MEDICATIONS:  1. Hydralazine 25 mg p.o. b.i.d.  2. Levothyroxine 75 mcg p.o. daily.  3. Calcium supplement.  4. Lasix 20 mg p.o. daily.  5. Lisinopril 10 mg p.o. daily.  6. Coreg 25 mg p.o. b.i.d.   PHYSICAL EXAMINATION:  VITAL SIGNS:  Blood pressure 160/70, heart rate  63 beats per minute, weight 172 pounds which is up 4 pounds since last  office visit.  NECK:  JVD is elevated to 9 cm.  No carotid bruits.  LUNGS:  Clear breath sounds bilaterally.  HEART:  Regular rate and rhythm with occasional extra systoles.  ABDOMEN:  Soft.  EXTREMITIES:  Peripheral pitting edema 2+.   PROBLEMS:  1. Nonischemic cardiomyopathy with severe LV dysfunction, ejection      fraction 25%.  2. Moderate mitral regurgitation.  3. Mild to moderate tricuspid regurgitation.  4. Pulmonary hypertension.  5. Mild atrial septal defects.  6. NYHA class 2B.  7. Increased lower extremity edema.  8. Hypertension, poorly controlled.  9. Weight gain.   PLAN:  1. The patient appears to have increased symptoms of volume retention,      likely secondary to increase in Coreg.  2. I have increased the patient's Lisinopril and have increased her      Lasix to 40 mg p.o. daily.      It is clear she needs  additional diuresis.  3. The patient will also have a B-met in one week and will have a      follow up with Dr. Myrtis Ser in the next couple of weeks.     Learta Codding, MD,FACC     GED/MedQ  DD: 12/31/2006  DT: 12/31/2006  Job #: [id number]                            [facility]                          [facility]   NAME:[name], [name]                     MRN:          [id number]  DATE:12/31/2006  DOB:          25-May-1925    REFERRING PHYSICIAN:  Dorise Hiss, M.D.   HISTORY OF PRESENT ILLNESS:  The patient is a 77 year old female with a  nonischemic cardiomyopathy and significant mitral regurgitation.  The  patient was seen by Dr. Myrtis Ser on December 06, 2006.  She is currently an NYHA  class 2B.  Her Coreg was increased during that office visit and has now  noticed some slight increase in edema and fatigue.  She denies any chest  pain, orthopnea, PND.  She has no palpitations or syncope.   MEDICATIONS:  1. Hydralazine 25 mg p.o. b.i.d.  2. Levothyroxine 75 mcg p.o. daily.  3. Calcium supplement.  4. Lasix 20 mg p.o. daily.  5. Lisinopril 10 mg p.o. daily.  6. Coreg 25 mg p.o. b.i.d.   PHYSICAL EXAMINATION:  VITAL SIGNS:  Blood pressure 160/70, heart rate  63 beats per minute, weight 172 pounds which is up 4 pounds since last  office visit.  NECK:  JVD is elevated to 9 cm.  No carotid bruits.  LUNGS:  Clear breath sounds bilaterally.  HEART:  Regular rate and rhythm with occasional extra systoles.  ABDOMEN:  Soft.  EXTREMITIES:  Peripheral pitting edema 2+.   PROBLEMS:  1. Nonischemic cardiomyopathy with severe LV dysfunction, ejection      fraction 25%.  2. Moderate mitral regurgitation.  3. Mild to moderate tricuspid regurgitation.  4. Pulmonary hypertension.  5. Mild atrial septal defects.  6. NYHA class 2B.  7. Increased lower extremity edema.  8. Hypertension, poorly controlled.  9. Weight gain.   PLAN:  1. The  patient appears to have increased symptoms of volume retention,      likely secondary to increase in Coreg.  2. I have increased the patient's Lisinopril and have increased her      Lasix to 40 mg p.o. daily.      It is clear she needs additional diuresis.  3. The patient will also have a B-met in one week and will have a      follow up with Dr. Myrtis Ser in the next couple of weeks.     Learta Codding, MD,FACC  Electronically Signed

## 2011-01-20 NOTE — Assessment & Plan Note (Signed)
[facility]                            [facility]   NAME:Fildes, ZNYA ALBINO                     MRN:          [id number]  DATE:06/25/2006                            DOB:          02-27-1925    Mrs. Hackley is here for followup.  Mrs. Kingbird is doing relatively well.  She has a severe cardiomyopathy.  She has a significant ongoing shortness of  breath.  She may be slightly better as we are adjusting her medicines.  She  has not had pre-syncope or syncope.  She has no chest pain.  Her meds are  being titrated.  When she was seen last on May 23, 2006 she was  stable.  She continues to tolerate the medicines well.   PAST MEDICAL HISTORY:   ALLERGIES:  NO KNOWN DRUG ALLERGIES.   MEDICATIONS:  1. Hydralazine 25 b.i.d.  2. Levothyroxine 75 mcg daily.  3. Calcium.  4. Lasix 20 every other day.  I have encouraged her to increase it to      every day.  5. Coreg (carvedilol) 6.25 b.i.d.   OTHER MEDICAL PROBLEMS:  See the list below.   REVIEW OF SYSTEMS:  She is doing perfectly well.  Her only symptom is her  shortness of breath.  Otherwise her review of systems is negative.   PHYSICAL EXAMINATION:  VITAL SIGNS:  Blood pressure 138/72 with pulse of 74.  Her weight is 166 pounds.  GENERAL:  The patient is well-developed and well-developed.  The patient is  oriented to person, time and place and her affect is normal.  HEENT:  Reveals no xanthelasma.  She has normal extraocular motion.  NECK:  There are no carotid bruits.  There is no jugular venous distention.  CARDIAC EXAM:  Reveals an S1 with an S2.  I do not hear any significant  murmurs today.  LUNGS:  Clear.  Respiratory effort is not labored.  ABDOMEN:  Soft.  There are no masses or bruits.  The patient has trace  peripheral edema at this time.  There  are no musculoskeletal deformities.   No labs are done.   PROBLEMS INCLUDE:  1. Fatigue and shortness of breath.  This  continues to be from her left      ventricular dysfunction.  It is not worse and slightly better I hope.  2. Severe left ventricular dysfunction with no significant ischemia by      nuclear scan.  3. Moderate mitral regurgitation.  4. Mild to moderate tricuspid regurgitation.  5. Moderate pulmonary hypertension.  6. Small atrioseptal defect with left to right shunt.  7. Normal TSH.  8. Normal renal function.   We will have her take her Lasix daily.  We will increase her Coreg from 6.25  to 12.5 and I will see her back in 3-4 weeks.            ______________________________  Luis Abed, MD, [facility]     JDK/MedQ  DD:  06/25/2006  DT:  06/26/2006  Job #:  [id number]   cc:   Dorise Hiss, M.D.

## 2011-01-20 NOTE — Assessment & Plan Note (Signed)
[facility]                            [facility]   NAME:[name], [name]                     MRN:          [id number]  DATE:05/18/2006                            DOB:          [birth year]    See my complete note of April 12, 2006.   At that time I was concerned about the patient's shortness of breath and  fatigue, and exercise intolerance.  We decided to do several studies, which  have been very helpful.  Her chest x-ray shows a moderate cardiomyopathy,  but no CHF.  I do know that from April of 2007 her BUN is 14 and creatinine  1.0.  Her TSH was normal at 2.02.  We did a 2D echo.  The patient has  significant left ventricular dysfunction.  Ejection fraction is in the 25%  to 30% range with global hypokinesis.  She also has moderate pulmonary  hypertension.  There is also question of a small atrial septal defect.  Nuclear scan was also done.  I believe that there is a typographical error  in the report concerning the ejection fraction.  The rest of the data fits  with severe hypokinesis that she has by echo, and there is no definite  ischemia.   Ms. Bruinsma is seen today, and we have discussed these results with her and  her family member.  I explained to her that she does have a weakening of her  heart muscle, and that we would adjust her medicines accordingly.   ALLERGIES:  No known drug allergies.   MEDICATIONS:  1. Hydralazine 25 mg b.i.d.  2. Thyroid 75 mcg.  3. Lasix 20 mg most days, and I have encouraged her now to take her Lasix      every day.   OTHER MEDICAL PROBLEMS:  See the list on my note of April 12, 2006.   REVIEW OF SYSTEMS:  She continues to have the same type of symptoms in the  HPI, but no other significant problems, and her review of systems otherwise  is negative.   PHYSICAL EXAMINATION:  The blood pressure today is 140/80, with a pulse of  84.  LUNGS:  Clear.  Respiratory effort is not labored.  HEENT:  Reveals no xanthelasma.  She has normal extraocular motion.  CARDIAC:  Exam reveals a soft systolic murmur.  ABDOMEN:  Soft with no masses or bruits.  She does have a trace peripheral edema.   The data from the studies is listed above.   PROBLEM LIST:  1. Fatigue and shortness of breath.  2. Severe left ventricular dysfunction by echo and nuclear scan, with no      significant ischemia.  3. Moderate mitral regurgitation.  4. Mild to moderate tricuspid regurgitation.  5. Moderate pulmonary hypertension.  6. Small atrial septal defect with left-to-right shunt flow.  7. Normal TSH.  8. Normal renal function.   We will start medications to be titrated for her left ventricular  dysfunction.  I have chosen first to start with low-dose Coreg, and I will  see her back in 1 week.  Luis Abed, MD, [facility]   JDK/MedQ  DD:  05/18/2006  DT:  05/20/2006  Job #:  [id number]   cc:   Dorise Hiss, M.D.

## 2011-01-20 NOTE — Assessment & Plan Note (Signed)
[facility]                          [facility]   NAME:Dyke, BRIANNIE GUTIERREZ                     MRN:          [id number]  DATE:12/06/2006                            DOB:          [birth year]    Ms. Olvera is seen for followup.  She was seen last in the office on  October 02, 2006.  She has a nonischemic cardiomyopathy.  Her meds are  being titrated and it is time for another change.  She is back, thinks  that she is feeling somewhat better over time.  She is going about  reasonable activities.  She has not had chest pain.  She has not had  syncope or pre-syncope.   PAST MEDICAL HISTORY:   ALLERGIES:  No known drug allergies.   MEDICATIONS:  1. Hydralazine 25 b.i.d.  2. Levothyroxine 75 mcg.  3. Calcium.  4. Lasix 20.  5. Coreg 12.5 b.i.d. (dose to be increased).  6. Lisinopril 10 mg daily.   OTHER MEDICAL PROBLEMS:  See the list below.   REVIEW OF SYSTEMS:  She is doing well and her review of systems is  negative today.   PHYSICAL EXAM:  Blood pressure is 172/73 with a pulse of 65.  Weight is  168.  The patient is oriented to person, time, and place.  Affect is normal.  She has no xanthelasma.  There is normal extraocular motion.  There are no carotid bruits.  There is no jugular venous distension.  CARDIAC:  Reveals an S1 with an S2.  There are no clicks or significant  murmurs.  ABDOMEN:  Soft.  There are no masses or bruits.  She has no peripheral  edema.   Today, an EKG is done and she has sinus rhythm.   PROBLEMS:  1. Severe left ventricular dysfunction.  We will increase her Coreg to      25 at night and 12.5 in the morning for a week, and then 25 b.i.d.,      and then I will see her back.  2. Moderate mitral regurgitation.  3. Mild to moderate tricuspid regurgitation.  4. Moderate pulmonary hypertension.  5. Small atrial septal defect with a left to right shunt.  6. New York Heart Association class II heart  failure, trace peripheral      edema historically.   Coreg dose is to be increased.  I will see her back for followup.  We  will then look into a follow up echo and then look into further  approaches to her cardiomyopathy.     Luis Abed, MD, [facility]  Electronically Signed    JDK/MedQ  DD: 12/06/2006  DT: 12/06/2006  Job #: [id number]   cc:   Dorise Hiss, M.D.

## 2011-01-20 NOTE — Assessment & Plan Note (Signed)
[facility]                            [name]   [name], [name]                     MRN:          [id number]  DATE:04/12/2006                            DOB:          [birth year]    CARDIOLOGY CONSULTATION   [name] is a very nice 77 year old lady who has chest and arm pain, and  shortness of breath.  She feels weak in general and has had some light-  headedness.  She has not had any syncope or presyncope.  Her husband of 60  years passed away over the past period of time and this does affect how she  feels about things in general.  Her shortness of breath can be at rest or  with exercise, or at night.  Her chest and arm discomfort can also be at  rest or with exercise.  It appears that her exercise tolerance is limited.   PAST MEDICAL HISTORY:  ALLERGIES:  NO KNOWN DRUG ALLERGIES.  MEDICATIONS:  1. Hydralazine 25 b.i.d.  2. Levothyroxine 75 mcg daily.  OTHER MEDICAL PROBLEMS:  See the complete list below.   SOCIAL HISTORY:  She is widowed and retired.  She does not smoke.   FAMILY HISTORY:  The family history is not contributory for this 77 year old  although there is a family history of coronary artery disease.   REVIEW OF SYSTEMS:  The patient does have some arthritic pain.  She is  fatigued.  Otherwise, her review of systems is negative other than the HPI.   PHYSICAL EXAMINATION:  VITAL SIGNS:  Blood pressure is 128/60 with a pulse  of 87.  GENERAL:  The patient is oriented to person, time and place.  PSYCHIATRIC:  Affect is normal.  LUNGS:  Clear.  Respiratory effort is not labored.  HEENT:  No xanthelasma.  She has normal extraocular motion.  NECK:  There are no carotid bruits.  There is no jugular venous distention.  CARDIAC EXAM:  An S1 with an S2.  There are no clicks or significant  murmurs.  ABDOMEN:  Soft.  There are no masses or bruits.  EXTREMITIES:  The patient has trivial peripheral edema.   EKG revealed sinus rhythm.  There is poor anterior R wave progression and  there are diffuse nonspecific ST-T wave changes.   PROBLEMS:  1. Status post hysterectomy in the past.  2. History of diverticulosis.  3. Abnormal EKG.  4. Exertional and rest shortness of breath.  5. Arm discomfort and chest discomfort.   The patient has multiple risk factors for coronary artery disease.  Her EKG  is abnormal.  We will proceed with a chest x-ray for her shortness of  breath.  Also, a 2-D echo will be done to assess LV function and her  valvular function.  Also because of the arm, shoulder and chest discomfort,  she needs a Cardiolite with adenosine.  I very carefully considered the  number of tests discussed here.  I feel that they are all appropriate and  needed to assess her fully at this time.  Luis Abed, MD, [facility]   JDK/MedQ  DD:  04/12/2006  DT:  04/12/2006  Job #:  [id number]   cc:   Dorise Hiss, MD

## 2011-02-03 ENCOUNTER — Encounter: Payer: Self-pay | Admitting: Cardiology

## 2011-02-03 DIAGNOSIS — I428 Other cardiomyopathies: Secondary | ICD-10-CM | POA: Insufficient documentation

## 2011-02-03 DIAGNOSIS — R609 Edema, unspecified: Secondary | ICD-10-CM | POA: Insufficient documentation

## 2011-02-03 DIAGNOSIS — R9430 Abnormal result of cardiovascular function study, unspecified: Secondary | ICD-10-CM | POA: Insufficient documentation

## 2011-02-03 DIAGNOSIS — I10 Essential (primary) hypertension: Secondary | ICD-10-CM | POA: Insufficient documentation

## 2011-02-03 DIAGNOSIS — E039 Hypothyroidism, unspecified: Secondary | ICD-10-CM | POA: Insufficient documentation

## 2011-02-03 DIAGNOSIS — Q211 Atrial septal defect, unspecified: Secondary | ICD-10-CM | POA: Insufficient documentation

## 2011-02-03 DIAGNOSIS — I272 Pulmonary hypertension, unspecified: Secondary | ICD-10-CM | POA: Insufficient documentation

## 2011-02-06 ENCOUNTER — Encounter: Payer: Self-pay | Admitting: *Deleted

## 2011-02-07 ENCOUNTER — Ambulatory Visit (INDEPENDENT_AMBULATORY_CARE_PROVIDER_SITE_OTHER): Payer: PRIVATE HEALTH INSURANCE | Admitting: Cardiology

## 2011-02-07 ENCOUNTER — Encounter: Payer: Self-pay | Admitting: Cardiology

## 2011-02-07 ENCOUNTER — Encounter: Payer: Self-pay | Admitting: *Deleted

## 2011-02-07 DIAGNOSIS — I10 Essential (primary) hypertension: Secondary | ICD-10-CM

## 2011-02-07 DIAGNOSIS — I428 Other cardiomyopathies: Secondary | ICD-10-CM

## 2011-02-07 NOTE — Assessment & Plan Note (Signed)
Historically ejection fraction improved to 50%.  There is no reason to repeat an echo at this time.

## 2011-02-07 NOTE — Assessment & Plan Note (Signed)
Blood pressure is controlled.  No change in therapy.  No further workup at this time.  I'll see her back in 6 months.

## 2011-02-07 NOTE — Progress Notes (Signed)
HPI Patient is seen for cardiology followup.  I saw him last December, 2011.  She looks great.  In the past she had decreased left ventricular function.  However her ejection fraction improved to 50%.  She's feeling well.  She has no signs of heart failure.  Her chronic edema is under control. Allergies  Allergen Reactions  . Aspirin     Current Outpatient Prescriptions  Medication Sig Dispense Refill  . ALPRAZolam (XANAX) 0.5 MG tablet Take 0.5 mg by mouth 2 (two) times daily.        . Calcium Carbonate-Vitamin D (CALTRATE 600+D) 600-400 MG-UNIT per tablet Take 1 tablet by mouth daily.        . carvedilol (COREG) 25 MG tablet Take 25 mg by mouth 2 (two) times daily with a meal.        . Folic Acid-Vit B6-Vit B12 0.4-10-0.2 MG TABS Place 1 tablet under the tongue daily.        . furosemide (LASIX) 40 MG tablet Take 40 mg by mouth 2 (two) times daily.        . hydrALAZINE (APRESOLINE) 25 MG tablet Take 25 mg by mouth 2 (two) times daily.        Marland Kitchen levothyroxine (SYNTHROID, LEVOTHROID) 75 MCG tablet Take 75 mcg by mouth daily.        Marland Kitchen lisinopril (PRINIVIL,ZESTRIL) 20 MG tablet Take 20 mg by mouth daily.        . Multiple Vitamin (MULTIVITAMIN) tablet Take 1 tablet by mouth daily.        . Omega-3 Fatty Acids 300 MG CAPS Take 2 capsules by mouth 2 (two) times daily.          History   Social History  . Marital Status: Married    Spouse Name: N/A    Number of Children: N/A  . Years of Education: N/A   Occupational History  . RETIRED    Social History Main Topics  . Smoking status: Never Smoker   . Smokeless tobacco: Never Used  . Alcohol Use: No  . Drug Use: No  . Sexually Active: Not on file   Other Topics Concern  . Not on file   Social History Narrative  . No narrative on file    Family History  Problem Relation Age of Onset  . Coronary artery disease      POSITIVE HX IN FAMILY    Past Medical History  Diagnosis Date  . Cardiomyopathy     Nonischemic, EF 25-30%  2007 / EF improved 50% 2008, ( catheterization not done) nuclear 2007, fixed anteroapical defect, no ischemia  . Ejection fraction     EF 50%, echo, 2008  . Hypertension   . Pulmonary hypertension   . ASD (atrial septal defect)     Small  . Edema     Chronic lower extremity edema... venous insufficiency  . Hypothyroidism     No past surgical history on file.  ROS  Patient denies fever, chills, headache, sweats, rash, change in vision, change in hearing, chest pain, cough, nausea vomiting, urinary symptoms.  All other systems are reviewed and are negative.  PHYSICAL EXAM Patient is quite stable today.  She is oriented to person time and place.  Affect is normal.  She is very active.  Head is atraumatic.  There is no jugular venous distention.  Lungs are clear.  Respiratory effort is unlabored.  Cardiac exam reveals S1-S2.  There are no clicks or significant murmurs.  The  abdomen is soft.  The patient has no significant edema today. Filed Vitals:   02/07/11 1020  BP: 134/73  Pulse: 62  Height: 5\' 5"  (1.651 m)  Weight: 164 lb (74.39 kg)    EKG Is not done today  ASSESSMENT & PLAN

## 2011-02-07 NOTE — Patient Instructions (Signed)
Your physician wants you to follow-up in: 6 months. You will receive a reminder letter in the mail one-two months in advance. If you don't receive a letter, please call our office to schedule the follow-up appointment. Your physician recommends that you continue on your current medications as directed. Please refer to the Current Medication list given to you today. 

## 2011-02-09 ENCOUNTER — Encounter: Payer: Self-pay | Admitting: Cardiology

## 2011-08-14 ENCOUNTER — Ambulatory Visit: Payer: PRIVATE HEALTH INSURANCE | Admitting: Cardiology

## 2011-09-13 ENCOUNTER — Ambulatory Visit: Payer: PRIVATE HEALTH INSURANCE | Admitting: Cardiology

## 2011-10-04 ENCOUNTER — Ambulatory Visit (INDEPENDENT_AMBULATORY_CARE_PROVIDER_SITE_OTHER): Payer: Medicare Other | Admitting: Cardiology

## 2011-10-04 ENCOUNTER — Encounter: Payer: Self-pay | Admitting: Cardiology

## 2011-10-04 DIAGNOSIS — R609 Edema, unspecified: Secondary | ICD-10-CM

## 2011-10-04 DIAGNOSIS — I428 Other cardiomyopathies: Secondary | ICD-10-CM

## 2011-10-04 DIAGNOSIS — I10 Essential (primary) hypertension: Secondary | ICD-10-CM

## 2011-10-04 NOTE — Patient Instructions (Signed)
Continue all current medications. Your physician wants you to follow up in: 9 months.  You will receive a reminder letter in the mail one-two months in advance.  If you don't receive a letter, please call our office to schedule the follow up appointment   

## 2011-10-04 NOTE — Assessment & Plan Note (Signed)
Blood pressure is mildly elevated today. She says it was normal at her last visit with her primary physician. She will need to followup with her primary care doctor. He

## 2011-10-04 NOTE — Assessment & Plan Note (Signed)
Patient is not having any significant edema. We have felt it was due to venous insufficiency in the past. No further workup.

## 2011-10-04 NOTE — Progress Notes (Signed)
HPI  The patient is doing very well. I saw her last in June, 2012. In the past she had a cardiomyopathy with significant decrease in LV function. Her EF improved in 2008. Nuclear in 2007 revealed a fixed defect but no ischemia. I have not done further testing since then as she has done so well. She is taking her medications. She saw her general doctor recently and she says her blood pressure was good. Today there is mild elevation. She has no significant symptoms.  Allergies  Allergen Reactions  . Aspirin     Current Outpatient Prescriptions  Medication Sig Dispense Refill  . ALPRAZolam (XANAX) 0.5 MG tablet Take 0.5 mg by mouth 2 (two) times daily.        . Calcium Carbonate-Vitamin D (CALTRATE 600+D) 600-400 MG-UNIT per tablet Take 1 tablet by mouth daily.        . carvedilol (COREG) 25 MG tablet Take 25 mg by mouth 2 (two) times daily with a meal.        . furosemide (LASIX) 40 MG tablet Take 40 mg by mouth 2 (two) times daily.        . hydrALAZINE (APRESOLINE) 25 MG tablet Take 25 mg by mouth 2 (two) times daily.        Marland Kitchen levothyroxine (SYNTHROID, LEVOTHROID) 75 MCG tablet Take 75 mcg by mouth daily.        Marland Kitchen lisinopril (PRINIVIL,ZESTRIL) 20 MG tablet Take 20 mg by mouth daily.        . Multiple Vitamin (MULTIVITAMIN) tablet Take 1 tablet by mouth daily.        . Omega-3 Fatty Acids 300 MG CAPS Take 2 capsules by mouth 2 (two) times daily.        . vitamin B-12 (CYANOCOBALAMIN) 1000 MCG tablet Take 1,000 mcg by mouth daily.        History   Social History  . Marital Status: Married    Spouse Name: N/A    Number of Children: N/A  . Years of Education: N/A   Occupational History  . RETIRED    Social History Main Topics  . Smoking status: Never Smoker   . Smokeless tobacco: Never Used  . Alcohol Use: No  . Drug Use: No  . Sexually Active: Not on file   Other Topics Concern  . Not on file   Social History Narrative  . No narrative on file    Family History  Problem  Relation Age of Onset  . Coronary artery disease      POSITIVE HX IN FAMILY    Past Medical History  Diagnosis Date  . Cardiomyopathy     Nonischemic, EF 25-30% 2007 / EF improved 50% 2008, ( catheterization not done) nuclear 2007, fixed anteroapical defect, no ischemia  . Ejection fraction     EF 50%, echo, 2008  . Hypertension   . Pulmonary hypertension   . ASD (atrial septal defect)     Small  . Edema     Chronic lower extremity edema... venous insufficiency  . Hypothyroidism     No past surgical history on file.  ROS    Patient denies fever, chills, headache, sweats, rash, change in vision, change in hearing, chest pain, cough, nausea vomiting, urinary symptoms. All other systems are reviewed and are negative.  PHYSICAL EXAM Patient is oriented to person time and place. Affect is normal. Head is atraumatic. There is no xanthelasma. There is no jugular venous distention. Lungs are clear. Respiratory effort  is nonlabored. Cardiac exam reveals S1 and S2. There no clicks or significant murmurs. The abdomen is soft. There is no peripheral edema. Filed Vitals:   10/04/11 0911  BP: 170/91  Pulse: 64  Height: 5\' 5"  (1.651 m)  Weight: 167 lb (75.751 kg)    EKG EKG is done today and reviewed by me. There is normal sinus rhythm. There is nonspecific ST-T wave changes.  ASSESSMENT & PLAN

## 2011-10-04 NOTE — Assessment & Plan Note (Signed)
Histologically the patient's LV function improved. I have not done followup echoes because she is doing so well clinically. The key to her treatment will be her blood pressure control. I've asked her followup with her primary physician for this.

## 2013-07-01 ENCOUNTER — Encounter: Payer: Self-pay | Admitting: Cardiology

## 2013-07-11 ENCOUNTER — Ambulatory Visit (INDEPENDENT_AMBULATORY_CARE_PROVIDER_SITE_OTHER): Payer: Medicare Other | Admitting: Cardiology

## 2013-07-11 ENCOUNTER — Encounter: Payer: Self-pay | Admitting: Cardiology

## 2013-07-11 VITALS — BP 138/69 | HR 58 | Ht 65.0 in | Wt 157.0 lb

## 2013-07-11 DIAGNOSIS — I10 Essential (primary) hypertension: Secondary | ICD-10-CM

## 2013-07-11 NOTE — Progress Notes (Signed)
HPI  This delightful 79 year old female is seen for followup. I saw her last January, 2013. In the past she had decreased left ventricular function. She was treated with appropriate medications. Fortunately her ejection fraction improved. She had a nuclear scan in 2007 which showed no ischemia. She has done extremely well. I have chosen not to do followup echoes or exercise tests unless she has problems. She's doing extremely well.  Allergies  Allergen Reactions  . Aspirin     Current Outpatient Prescriptions  Medication Sig Dispense Refill  . carvedilol (COREG) 25 MG tablet Take 25 mg by mouth daily.       . furosemide (LASIX) 40 MG tablet Take 40 mg by mouth 2 (two) times daily.        . hydrALAZINE (APRESOLINE) 50 MG tablet Take 50 mg by mouth 2 (two) times daily.      Marland Kitchen levothyroxine (SYNTHROID, LEVOTHROID) 75 MCG tablet Take 75 mcg by mouth daily.        Marland Kitchen lisinopril (PRINIVIL,ZESTRIL) 20 MG tablet Take 20 mg by mouth daily.        . meclizine (ANTIVERT) 25 MG tablet Take 25 mg by mouth 3 (three) times daily as needed.       . Multiple Vitamin (MULTIVITAMIN) tablet Take 1 tablet by mouth daily.        . Omega-3 Fatty Acids 300 MG CAPS Take 2 capsules by mouth 2 (two) times daily.        . vitamin B-12 (CYANOCOBALAMIN) 1000 MCG tablet Take 1,000 mcg by mouth daily.       No current facility-administered medications for this visit.    History   Social History  . Marital Status: Widowed    Spouse Name: N/A    Number of Children: N/A  . Years of Education: N/A   Occupational History  . RETIRED    Social History Main Topics  . Smoking status: Never Smoker   . Smokeless tobacco: Never Used  . Alcohol Use: No  . Drug Use: No  . Sexual Activity: Not on file   Other Topics Concern  . Not on file   Social History Narrative  . No narrative on file    Family History  Problem Relation Age of Onset  . Coronary artery disease      POSITIVE HX IN FAMILY    Past  Medical History  Diagnosis Date  . Cardiomyopathy     Nonischemic, EF 25-30% 2007 / EF improved 50% 2008, ( catheterization not done) nuclear 2007, fixed anteroapical defect, no ischemia  . Ejection fraction     EF 50%, echo, 2008  . Hypertension   . Pulmonary hypertension   . ASD (atrial septal defect)     Small  . Edema     Chronic lower extremity edema... venous insufficiency  . Hypothyroidism     History reviewed. No pertinent past surgical history.  Patient Active Problem List   Diagnosis Date Noted  . Cardiomyopathy   . Ejection fraction   . Hypertension   . Hypothyroidism   . ASD (atrial septal defect)   . Edema   . Pulmonary hypertension     ROS  Patient denies fever, chills, headache, sweats, rash, change in vision, change in hearing, chest pain, cough, nausea vomiting, urinary symptoms. All other systems are reviewed and are negative.  PHYSICAL EXAM  She looks great for her 79 years of age. She is oriented to person time and place. Affect is  normal. There is no jugulovenous distention. Lungs are clear. Respiratory effort is nonlabored. Cardiac exam reveals S1 without S2. There is a 2/6 systolic murmur. The abdomen is soft. There is no peripheral edema. There are no musculoskeletal deformities. There are no skin rashes.  Filed Vitals:   07/11/13 0856  BP: 138/69  Pulse: 58  Height: 5\' 5"  (1.651 m)  Weight: 157 lb (71.215 kg)   EKG is done today and reviewed by me. There is normal sinus rhythm. There is no change from the prior EKG.  ASSESSMENT & PLAN

## 2013-07-11 NOTE — Assessment & Plan Note (Signed)
Historically she had reduced ejection fraction in 2007. This improved in 2008. Catheterization was not done. She is on good medicines. I have mentioned that we may want to change her carvedilol to twice a day

## 2013-07-11 NOTE — Assessment & Plan Note (Signed)
Blood pressure is controlled on her current regimen. I have noticed that her carvedilol dose is 25 mg once a day. Most of the time we use this medicine on a twice a day basis. I have chosen not to make any changes today. I asked the patient to talk with Dr.Fanta about this when she sees him next. Her 25 mg dose could be cut in half and changed to 12.5 mg twice a day. She is stable with the current dosing. I have not made a change today because I do not know all of the decision making involved in the original dosing.

## 2013-07-11 NOTE — Patient Instructions (Signed)

## 2013-12-15 ENCOUNTER — Emergency Department (HOSPITAL_COMMUNITY): Payer: Medicare Other

## 2013-12-15 ENCOUNTER — Emergency Department (HOSPITAL_COMMUNITY)
Admission: EM | Admit: 2013-12-15 | Discharge: 2013-12-15 | Disposition: A | Discharge status: Home / Self Care | Payer: Medicare Other | Attending: Emergency Medicine | Admitting: Emergency Medicine

## 2013-12-15 ENCOUNTER — Encounter (HOSPITAL_COMMUNITY): Payer: Self-pay | Admitting: Emergency Medicine

## 2013-12-15 DIAGNOSIS — R531 Weakness: Secondary | ICD-10-CM

## 2013-12-15 DIAGNOSIS — H5789 Other specified disorders of eye and adnexa: Secondary | ICD-10-CM | POA: Insufficient documentation

## 2013-12-15 DIAGNOSIS — R059 Cough, unspecified: Secondary | ICD-10-CM | POA: Insufficient documentation

## 2013-12-15 DIAGNOSIS — R609 Edema, unspecified: Secondary | ICD-10-CM | POA: Insufficient documentation

## 2013-12-15 DIAGNOSIS — Z79899 Other long term (current) drug therapy: Secondary | ICD-10-CM | POA: Insufficient documentation

## 2013-12-15 DIAGNOSIS — M25579 Pain in unspecified ankle and joints of unspecified foot: Secondary | ICD-10-CM | POA: Insufficient documentation

## 2013-12-15 DIAGNOSIS — R05 Cough: Secondary | ICD-10-CM | POA: Insufficient documentation

## 2013-12-15 DIAGNOSIS — I10 Essential (primary) hypertension: Secondary | ICD-10-CM | POA: Insufficient documentation

## 2013-12-15 DIAGNOSIS — E039 Hypothyroidism, unspecified: Secondary | ICD-10-CM | POA: Insufficient documentation

## 2013-12-15 DIAGNOSIS — R5381 Other malaise: Secondary | ICD-10-CM | POA: Insufficient documentation

## 2013-12-15 DIAGNOSIS — R5383 Other fatigue: Principal | ICD-10-CM

## 2013-12-15 DIAGNOSIS — M7989 Other specified soft tissue disorders: Secondary | ICD-10-CM | POA: Insufficient documentation

## 2013-12-15 LAB — URINALYSIS, ROUTINE W REFLEX MICROSCOPIC
Bilirubin Urine: NEGATIVE
GLUCOSE, UA: NEGATIVE mg/dL
KETONES UR: NEGATIVE mg/dL
LEUKOCYTES UA: NEGATIVE
Nitrite: NEGATIVE
PROTEIN: NEGATIVE mg/dL
Specific Gravity, Urine: >1.03 — ABNORMAL HIGH (ref 1.005–1.030)
UROBILINOGEN UA: 0.2 mg/dL (ref 0.0–1.0)
pH: 5.5 (ref 5.0–8.0)

## 2013-12-15 LAB — CBC WITH DIFFERENTIAL/PLATELET
Basophils Absolute: 0 10*3/uL (ref 0.0–0.1)
Basophils Relative: 0 % (ref 0–1)
EOS ABS: 0.2 10*3/uL (ref 0.0–0.7)
Eosinophils Relative: 2 % (ref 0–5)
HCT: 34.9 % — ABNORMAL LOW (ref 36.0–46.0)
Hemoglobin: 11.7 g/dL — ABNORMAL LOW (ref 12.0–15.0)
Lymphocytes Relative: 19 % (ref 12–46)
Lymphs Abs: 1.4 10*3/uL (ref 0.7–4.0)
MCH: 30 pg (ref 26.0–34.0)
MCHC: 33.5 g/dL (ref 30.0–36.0)
MCV: 89.5 fL (ref 78.0–100.0)
MONOS PCT: 5 % (ref 3–12)
Monocytes Absolute: 0.4 10*3/uL (ref 0.1–1.0)
NEUTROS PCT: 74 % (ref 43–77)
Neutro Abs: 5.4 10*3/uL (ref 1.7–7.7)
Platelets: 249 10*3/uL (ref 150–400)
RBC: 3.9 MIL/uL (ref 3.87–5.11)
RDW: 13.4 % (ref 11.5–15.5)
WBC: 7.3 10*3/uL (ref 4.0–10.5)

## 2013-12-15 LAB — BASIC METABOLIC PANEL
BUN: 18 mg/dL (ref 6–23)
CHLORIDE: 101 meq/L (ref 96–112)
CO2: 29 mEq/L (ref 19–32)
Calcium: 9.3 mg/dL (ref 8.4–10.5)
Creatinine, Ser: 1.09 mg/dL (ref 0.50–1.10)
GFR, EST AFRICAN AMERICAN: 52 mL/min — AB (ref >90)
GFR, EST NON AFRICAN AMERICAN: 45 mL/min — AB (ref >90)
Glucose, Bld: 138 mg/dL — ABNORMAL HIGH (ref 70–99)
POTASSIUM: 3.6 meq/L — AB (ref 3.7–5.3)
SODIUM: 142 meq/L (ref 137–147)

## 2013-12-15 LAB — URINE MICROSCOPIC-ADD ON

## 2013-12-15 LAB — PRO B NATRIURETIC PEPTIDE: Pro B Natriuretic peptide (BNP): 131.8 pg/mL (ref 0–450)

## 2013-12-15 LAB — TROPONIN I: Troponin I: <0.3 ng/mL (ref <0.30)

## 2013-12-15 MED ORDER — POTASSIUM CHLORIDE CRYS ER 20 MEQ PO TBCR
40.0000 meq | EXTENDED_RELEASE_TABLET | Freq: Once | ORAL | Status: AC
  Administered 2013-12-15: 40 meq via ORAL
  Filled 2013-12-15: qty 2

## 2013-12-15 MED ORDER — TOBRAMYCIN 0.3 % OP SOLN: 2.0000 [drp] | OPHTHALMIC | Status: DC

## 2013-12-15 MED ORDER — TOBRAMYCIN 0.3 % OP SOLN
OPHTHALMIC | Status: AC
  Filled 2013-12-15: qty 5

## 2013-12-15 MED ORDER — TOBRAMYCIN 0.3 % OP SOLN
2.0000 [drp] | OPHTHALMIC | Status: DC
  Administered 2013-12-15: 2 [drp] via OPHTHALMIC

## 2013-12-15 NOTE — Discharge Instructions (Signed)
Fatigue [name] should elevate her legs above her heart on 2 pillows to help with swelling. Have her see her foot doctor regarding left foot pain. Take Tylenol as directed for foot pain. Use the eyedrops as directed. See Dr. Felecia Shelling in the office if your eyes not improved in 2 or 3 days Fatigue is a feeling of tiredness, lack of energy, lack of motivation, or feeling tired all the time. Having enough rest, good nutrition, and reducing stress will normally reduce fatigue. Consult your caregiver if it persists. The nature of your fatigue will help your caregiver to find out its cause. The treatment is based on the cause.  CAUSES  There are many causes for fatigue. Most of the time, fatigue can be traced to one or more of your habits or routines. Most causes fit into one or more of three general areas. They are: Lifestyle problems  Sleep disturbances.  Overwork.  Physical exertion.  Unhealthy habits.  Poor eating habits or eating disorders.  Alcohol and/or drug use .  Lack of proper nutrition (malnutrition). Psychological problems  Stress and/or anxiety problems.  Depression.  Grief.  Boredom. Medical Problems or Conditions  Anemia.  Pregnancy.  Thyroid gland problems.  Recovery from major surgery.  Continuous pain.  Emphysema or asthma that is not well controlled  Allergic conditions.  Diabetes.  Infections (such as mononucleosis).  Obesity.  Sleep disorders, such as sleep apnea.  Heart failure or other heart-related problems.  Cancer.  Kidney disease.  Liver disease.  Effects of certain medicines such as antihistamines, cough and cold remedies, prescription pain medicines, heart and blood pressure medicines, drugs used for treatment of cancer, and some antidepressants. SYMPTOMS  The symptoms of fatigue include:   Lack of energy.  Lack of drive (motivation).  Drowsiness.  Feeling of indifference to the surroundings. DIAGNOSIS  The details of how  you feel help guide your caregiver in finding out what is causing the fatigue. You will be asked about your present and past health condition. It is important to review all medicines that you take, including prescription and non-prescription items. A thorough exam will be done. You will be questioned about your feelings, habits, and normal lifestyle. Your caregiver may suggest blood tests, urine tests, or other tests to look for common medical causes of fatigue.  TREATMENT  Fatigue is treated by correcting the underlying cause. For example, if you have continuous pain or depression, treating these causes will improve how you feel. Similarly, adjusting the dose of certain medicines will help in reducing fatigue.  HOME CARE INSTRUCTIONS   Try to get the required amount of good sleep every night.  Eat a healthy and nutritious diet, and drink enough water throughout the day.  Practice ways of relaxing (including yoga or meditation).  Exercise regularly.  Make plans to change situations that cause stress. Act on those plans so that stresses decrease over time. Keep your work and personal routine reasonable.  Avoid street drugs and minimize use of alcohol.  Start taking a daily multivitamin after consulting your caregiver. SEEK MEDICAL CARE IF:   You have persistent tiredness, which cannot be accounted for.  You have fever.  You have unintentional weight loss.  You have headaches.  You have disturbed sleep throughout the night.  You are feeling sad.  You have constipation.  You have dry skin.  You have gained weight.  You are taking any new or different medicines that you suspect are causing fatigue.  You are unable  to sleep at night.  You develop any unusual swelling of your legs or other parts of your body. SEEK IMMEDIATE MEDICAL CARE IF:   You are feeling confused.  Your vision is blurred.  You feel faint or pass out.  You develop severe headache.  You develop severe  abdominal, pelvic, or back pain.  You develop chest pain, shortness of breath, or an irregular or fast heartbeat.  You are unable to pass a normal amount of urine.  You develop abnormal bleeding such as bleeding from the rectum or you vomit blood.  You have thoughts about harming yourself or committing suicide.  You are worried that you might harm someone else. MAKE SURE YOU:   Understand these instructions.  Will watch your condition.  Will get help right away if you are not doing well or get worse. Document Released: 06/18/2007 Document Revised: 11/13/2011 Document Reviewed: 06/18/2007 [facility]ExitCare Patient Information 2014 [place], [place]. Conjunctivitis Conjunctivitis is commonly called "pink eye." Conjunctivitis can be caused by bacterial or viral infection, allergies, or injuries. There is usually redness of the lining of the eye, itching, discomfort, and sometimes discharge. There may be deposits of matter along the eyelids. A viral infection usually causes a watery discharge, while a bacterial infection causes a yellowish, thick discharge. Pink eye is very contagious and spreads by direct contact. You may be given antibiotic eyedrops as part of your treatment. Before using your eye medicine, remove all drainage from the eye by washing gently with warm water and cotton balls. Continue to use the medication until you have awakened 2 mornings in a row without discharge from the eye. Do not rub your eye. This increases the irritation and helps spread infection. Use separate towels from other household members. Wash your hands with soap and water before and after touching your eyes. Use cold compresses to reduce pain and sunglasses to relieve irritation from light. Do not wear contact lenses or wear eye makeup until the infection is gone. SEEK MEDICAL CARE IF:   Your symptoms are not better after 3 days of treatment.  You have increased pain or trouble seeing.  The outer eyelids become  very red or swollen. Document Released: 09/28/2004 Document Revised: 11/13/2011 Document Reviewed: 08/21/2005 [facility] Patient Information 2014 [place], [place].

## 2013-12-15 NOTE — ED Notes (Signed)
Progressively getting weaker since easter.  C/o dizziness.  Left eye red and swollen.  Lower extremity swelling.  Seen dr. Felecia Shelling and he sent her to er.

## 2013-12-15 NOTE — ED Notes (Signed)
Pt seen today by PCP and and was told to come to ER because "something could be wrong with her heart". Pt has been progressively getting weaker since Easter.  Pt normally independent but currently needs assistance with all ADL's per granddaughter. In addition, pt with increased lower extremity edema over last several days per granddaughter. Pt normally oriented X3 but per granddaughter, is now having some dementia. Pt's sclera L. Eye red. No drainage noted.

## 2013-12-15 NOTE — ED Provider Notes (Signed)
CSN: [id number]     Arrival date & time 12/15/13  1613 History   First MD Initiated Contact with Patient 12/15/13 1815     Chief Complaint  Patient presents with  . Weakness     (Consider location/radiation/quality/duration/timing/severity/associated sxs/prior Treatment) Patient is a 80 y.o. female presenting with weakness.  Weakness   She is obtained from patient and from patient's great-granddaughter. Patient complains of generalized weakness for approximately 2 weeks. She's had swelling in both legs for several weeks. Denies any chest pain. Denies fever  Great-granddaughter r reports that she's been coughing for several weeks. Patient also complains of pain in her left distal foot for several weeks, for which she has seen a podiatrist. She is also had redness of her left left eye for a few days. Denies visual changes denies eye pain. Seen by Dr. Felecia Shelling earlier today and advised to come to the emergency department for further evaluation. Patient denies fever states she's presently hungry denies abdominal pain last bowel movement yesterday, normal no urinary symptoms no headache no other associated symptoms. Nothing makes symptoms better or worse. She states she feels well at present. Past Medical History  Diagnosis Date  . Cardiomyopathy     Nonischemic, EF 25-30% 2007 / EF improved 50% 2008, ( catheterization not done) nuclear 2007, fixed anteroapical defect, no ischemia  . Ejection fraction     EF 50%, echo, 2008  . Hypertension   . Pulmonary hypertension   . ASD (atrial septal defect)     Small  . Edema     Chronic lower extremity edema... venous insufficiency  . Hypothyroidism    History reviewed. No pertinent past surgical history. Family History  Problem Relation Age of Onset  . Coronary artery disease      POSITIVE HX IN FAMILY   History  Substance Use Topics  . Smoking status: Never Smoker   . Smokeless tobacco: Never Used  . Alcohol Use: No   OB History   Grav  Para Term Preterm Abortions TAB SAB Ect Mult Living                 Review of Systems  HENT: Negative.   Eyes: Positive for discharge and redness.       Redness and discharge from left eye  Respiratory: Positive for cough.   Cardiovascular: Positive for leg swelling.  Gastrointestinal: Negative.   Musculoskeletal: Negative.        Left foot pain  Skin: Negative.   Neurological: Positive for weakness.  Psychiatric/Behavioral: Negative.   All other systems reviewed and are negative.     Allergies  Aspirin  Home Medications   Current Outpatient Rx  Name  Route  Sig  Dispense  Refill  . carvedilol (COREG) 25 MG tablet   Oral   Take 25 mg by mouth daily.          . furosemide (LASIX) 40 MG tablet   Oral   Take 40 mg by mouth 2 (two) times daily.           . hydrALAZINE (APRESOLINE) 50 MG tablet   Oral   Take 50 mg by mouth 2 (two) times daily.         Marland Kitchen levothyroxine (SYNTHROID, LEVOTHROID) 75 MCG tablet   Oral   Take 75 mcg by mouth daily.           Marland Kitchen lisinopril (PRINIVIL,ZESTRIL) 20 MG tablet   Oral   Take 20 mg by mouth daily.           Marland Kitchen  meclizine (ANTIVERT) 25 MG tablet   Oral   Take 25 mg by mouth 3 (three) times daily as needed.          . Multiple Vitamin (MULTIVITAMIN) tablet   Oral   Take 1 tablet by mouth daily.           . Omega-3 Fatty Acids 300 MG CAPS   Oral   Take 2 capsules by mouth 2 (two) times daily.           . vitamin B-12 (CYANOCOBALAMIN) 1000 MCG tablet   Oral   Take 1,000 mcg by mouth daily.          BP 125/56  Pulse 79  Temp(Src) 98.6 F (37 C) (Oral)  Resp 18  Ht 5\' 4"  (1.626 m)  Wt 156 lb (70.761 kg)  BMI 26.76 kg/m2  SpO2 96% Physical Exam  Nursing note and vitals reviewed. Constitutional: She appears well-developed and well-nourished.  HENT:  Head: Normocephalic and atraumatic.  Eyes: Conjunctivae are normal. Pupils are equal, round, and reactive to light. Left eye exhibits discharge.  Left eye  with some conjunctival erythema with yellow drainage on the lashes  Neck: Neck supple. No JVD present. No tracheal deviation present. No thyromegaly present.  Cardiovascular: Normal rate and regular rhythm.   No murmur heard. Pulmonary/Chest: Effort normal and breath sounds normal.  Abdominal: Soft. Bowel sounds are normal. She exhibits no distension. There is no tenderness.  Musculoskeletal: Normal range of motion. She exhibits edema. She exhibits no tenderness.  1+ pretibial pitting edema bilaterally. DP pulses 2+ bilaterally. Left foot minimally tender at the first MTP joint not red hot or swollen  Neurological: She is alert. Coordination normal.  Gait normal  Skin: Skin is warm and dry. No rash noted.  Psychiatric: She has a normal mood and affect.    ED Course  Procedures (including critical care time) Labs Review Labs Reviewed  CBC WITH DIFFERENTIAL - Abnormal; Notable for the following:    Hemoglobin 11.7 (*)    HCT 34.9 (*)    All other components within normal limits  BASIC METABOLIC PANEL - Abnormal; Notable for the following:    Potassium 3.6 (*)    Glucose, Bld 138 (*)    GFR calc non Af Amer 45 (*)    GFR calc Af Amer 52 (*)    All other components within normal limits  TROPONIN I   Imaging Review No results found.   EKG Interpretation   Date/Time:  Monday December 15 2013 16:23:33 EDT Ventricular Rate:  74 PR Interval:  164 QRS Duration: 98 QT Interval:  372 QTC Calculation: 412 R Axis:   55 Text Interpretation:  Normal sinus rhythm Possible Left atrial enlargement  Nonspecific T wave abnormality Abnormal ECG No previous ECGs available     Chest x-ray viewed by me Results for orders placed during the hospital encounter of 12/15/13  CBC WITH DIFFERENTIAL      Result Value Ref Range   WBC 7.3  4.0 - 10.5 K/uL   RBC 3.90  3.87 - 5.11 MIL/uL   Hemoglobin 11.7 (*) 12.0 - 15.0 g/dL   HCT 16.1 (*) 09.6 - 04.5 %   MCV 89.5  78.0 - 100.0 fL   MCH 30.0  26.0 -  34.0 pg   MCHC 33.5  30.0 - 36.0 g/dL   RDW 40.9  81.1 - 91.4 %   Platelets 249  150 - 400 K/uL   Neutrophils Relative % 74  43 - 77 %   Neutro Abs 5.4  1.7 - 7.7 K/uL   Lymphocytes Relative 19  12 - 46 %   Lymphs Abs 1.4  0.7 - 4.0 K/uL   Monocytes Relative 5  3 - 12 %   Monocytes Absolute 0.4  0.1 - 1.0 K/uL   Eosinophils Relative 2  0 - 5 %   Eosinophils Absolute 0.2  0.0 - 0.7 K/uL   Basophils Relative 0  0 - 1 %   Basophils Absolute 0.0  0.0 - 0.1 K/uL  BASIC METABOLIC PANEL      Result Value Ref Range   Sodium 142  137 - 147 mEq/L   Potassium 3.6 (*) 3.7 - 5.3 mEq/L   Chloride 101  96 - 112 mEq/L   CO2 29  19 - 32 mEq/L   Glucose, Bld 138 (*) 70 - 99 mg/dL   BUN 18  6 - 23 mg/dL   Creatinine, Ser 4.091.09  0.50 - 1.10 mg/dL   Calcium 9.3  8.4 - 81.110.5 mg/dL   GFR calc non Af Amer 45 (*) >90 mL/min   GFR calc Af Amer 52 (*) >90 mL/min  TROPONIN I      Result Value Ref Range   Troponin I <0.30  <0.30 ng/mL  PRO B NATRIURETIC PEPTIDE      Result Value Ref Range   Pro B Natriuretic peptide (BNP) 131.8  0 - 450 pg/mL  URINALYSIS, ROUTINE W REFLEX MICROSCOPIC      Result Value Ref Range   Color, Urine YELLOW  YELLOW   APPearance CLEAR  CLEAR   Specific Gravity, Urine >1.030 (*) 1.005 - 1.030   pH 5.5  5.0 - 8.0   Glucose, UA NEGATIVE  NEGATIVE mg/dL   Hgb urine dipstick TRACE (*) NEGATIVE   Bilirubin Urine NEGATIVE  NEGATIVE   Ketones, ur NEGATIVE  NEGATIVE mg/dL   Protein, ur NEGATIVE  NEGATIVE mg/dL   Urobilinogen, UA 0.2  0.0 - 1.0 mg/dL   Nitrite NEGATIVE  NEGATIVE   Leukocytes, UA NEGATIVE  NEGATIVE  URINE MICROSCOPIC-ADD ON      Result Value Ref Range   Squamous Epithelial / LPF FEW (*) RARE   WBC, UA 0-2  <3 WBC/hpf   RBC / HPF 0-2  <3 RBC/hpf   Bacteria, UA FEW (*) RARE   Dg Chest 2 View  12/15/2013   CLINICAL DATA:  Progressive weakness and lower extremity swelling. History of cardiomyopathy.  EXAM: CHEST  2 VIEW  COMPARISON:  12/13/2011  FINDINGS: There is  chronic cardiomegaly. Pulmonary vascularity is at the upper limits of normal. No acute infiltrates or effusions. Slight scarring in the left midzone. No acute osseous abnormality.  IMPRESSION: No significant change. Chronic cardiomegaly with slight chronic pulmonary vascular prominence.   Electronically Signed   By: Geanie CooleyJim  Maxwell M.D.   On: 12/15/2013 20:09    840 p.m. patient resting comfortably. No distress.  MDM  Plan tobramycin ophthalmic solution 2 drops every 4 hours. Bottle to go. Patient is to followup with Dr. Felecia ShellingFanta this week. I don't feel that she would benefit from further diuresis as she has generalized weakness. Notice of acute coronary syndrome or congestive heart failure. She should follow up with podiatrist for left foot pain. No evidence of gout. No evidence of infection or vascular insufficiency Diagnosis #1 weakness #2 conjunctivitis left eye #3 peripheral edema #4 hypokalemia Final diagnoses:  None        Doug SouSam Marlee Trentman, MD  12/15/13 2050 

## 2014-09-01 ENCOUNTER — Emergency Department (HOSPITAL_COMMUNITY): Payer: Medicare HMO

## 2014-09-01 ENCOUNTER — Emergency Department (HOSPITAL_COMMUNITY)
Admission: EM | Admit: 2014-09-01 | Discharge: 2014-09-01 | Disposition: A | Discharge status: Home / Self Care | Payer: Medicare HMO | Attending: Emergency Medicine | Admitting: Emergency Medicine

## 2014-09-01 ENCOUNTER — Encounter (HOSPITAL_COMMUNITY): Payer: Self-pay | Admitting: *Deleted

## 2014-09-01 DIAGNOSIS — Z79899 Other long term (current) drug therapy: Secondary | ICD-10-CM | POA: Diagnosis not present

## 2014-09-01 DIAGNOSIS — R55 Syncope and collapse: Secondary | ICD-10-CM

## 2014-09-01 DIAGNOSIS — R42 Dizziness and giddiness: Secondary | ICD-10-CM | POA: Insufficient documentation

## 2014-09-01 DIAGNOSIS — E039 Hypothyroidism, unspecified: Secondary | ICD-10-CM | POA: Diagnosis not present

## 2014-09-01 DIAGNOSIS — Z8673 Personal history of transient ischemic attack (TIA), and cerebral infarction without residual deficits: Secondary | ICD-10-CM | POA: Insufficient documentation

## 2014-09-01 DIAGNOSIS — I10 Essential (primary) hypertension: Secondary | ICD-10-CM | POA: Diagnosis not present

## 2014-09-01 DIAGNOSIS — R609 Edema, unspecified: Secondary | ICD-10-CM | POA: Diagnosis not present

## 2014-09-01 DIAGNOSIS — Z791 Long term (current) use of non-steroidal anti-inflammatories (NSAID): Secondary | ICD-10-CM | POA: Insufficient documentation

## 2014-09-01 DIAGNOSIS — Q211 Atrial septal defect: Secondary | ICD-10-CM | POA: Diagnosis not present

## 2014-09-01 DIAGNOSIS — R531 Weakness: Secondary | ICD-10-CM | POA: Diagnosis not present

## 2014-09-01 DIAGNOSIS — Z8739 Personal history of other diseases of the musculoskeletal system and connective tissue: Secondary | ICD-10-CM | POA: Diagnosis not present

## 2014-09-01 DIAGNOSIS — Z792 Long term (current) use of antibiotics: Secondary | ICD-10-CM | POA: Diagnosis not present

## 2014-09-01 DIAGNOSIS — Z8719 Personal history of other diseases of the digestive system: Secondary | ICD-10-CM | POA: Diagnosis not present

## 2014-09-01 HISTORY — DX: Diverticulitis of intestine, part unspecified, without perforation or abscess without bleeding: K57.92

## 2014-09-01 HISTORY — DX: Gout, unspecified: M10.9

## 2014-09-01 HISTORY — DX: Cerebral infarction, unspecified: I63.9

## 2014-09-01 LAB — CBC WITH DIFFERENTIAL/PLATELET
Basophils Absolute: 0 10*3/uL (ref 0.0–0.1)
Basophils Relative: 0 % (ref 0–1)
Eosinophils Absolute: 0 10*3/uL (ref 0.0–0.7)
Eosinophils Relative: 1 % (ref 0–5)
HEMATOCRIT: 32.4 % — AB (ref 36.0–46.0)
HEMOGLOBIN: 10.7 g/dL — AB (ref 12.0–15.0)
LYMPHS PCT: 22 % (ref 12–46)
Lymphs Abs: 1.2 10*3/uL (ref 0.7–4.0)
MCH: 30.1 pg (ref 26.0–34.0)
MCHC: 33 g/dL (ref 30.0–36.0)
MCV: 91 fL (ref 78.0–100.0)
MONO ABS: 0.3 10*3/uL (ref 0.1–1.0)
MONOS PCT: 5 % (ref 3–12)
Neutro Abs: 3.7 10*3/uL (ref 1.7–7.7)
Neutrophils Relative %: 72 % (ref 43–77)
Platelets: 148 10*3/uL — ABNORMAL LOW (ref 150–400)
RBC: 3.56 MIL/uL — ABNORMAL LOW (ref 3.87–5.11)
RDW: 13.5 % (ref 11.5–15.5)
WBC: 5.2 10*3/uL (ref 4.0–10.5)

## 2014-09-01 LAB — COMPREHENSIVE METABOLIC PANEL
ALT: 10 U/L (ref 0–35)
ANION GAP: 7 (ref 5–15)
AST: 15 U/L (ref 0–37)
Albumin: 3.9 g/dL (ref 3.5–5.2)
Alkaline Phosphatase: 60 U/L (ref 39–117)
BILIRUBIN TOTAL: 0.6 mg/dL (ref 0.3–1.2)
BUN: 41 mg/dL — AB (ref 6–23)
CHLORIDE: 108 meq/L (ref 96–112)
CO2: 30 mmol/L (ref 19–32)
CREATININE: 1.49 mg/dL — AB (ref 0.50–1.10)
Calcium: 9 mg/dL (ref 8.4–10.5)
GFR calc non Af Amer: 30 mL/min — ABNORMAL LOW (ref >90)
GFR, EST AFRICAN AMERICAN: 35 mL/min — AB (ref >90)
GLUCOSE: 121 mg/dL — AB (ref 70–99)
Potassium: 3.7 mmol/L (ref 3.5–5.1)
Sodium: 145 mmol/L (ref 135–145)
Total Protein: 6.8 g/dL (ref 6.0–8.3)

## 2014-09-01 LAB — TROPONIN I: Troponin I: <0.03 ng/mL (ref <0.031)

## 2014-09-01 NOTE — ED Notes (Signed)
Had syncopal episode, when sitting talking with family, EMS came to check pt and family took pt to Dr Letitia Neri office No office, sl nausea,  Dizzy.

## 2014-09-01 NOTE — ED Provider Notes (Addendum)
CSN: [id number]     Arrival date & time 09/01/14  1556 History  This chart was scribed for [name] Lennert, MD by [place] Peace, ED Scribe. The patient was seen in [facility]. The patient's care was started at 4:27 PM.    Chief Complaint  Patient presents with  . Loss of Consciousness      Patient is a 80 y.o. female presenting with syncope. The history is provided by the patient. No language interpreter was used.  Loss of Consciousness Associated symptoms: dizziness   Associated symptoms: no chest pain, no headaches and no seizures     HPI Comments: [name] is a 80 y.o. female who presents to the Emergency Department complaining of syncopal episode earlier today that occurred while pt was in the middle of a conversation with her family member. Pt states that she felt dizziness coming on during incident. Family members add that when pt woke up from LOC episode that she exhibited slurred speech. EMS was contacted to come check out pt and then family took pt to see her PCP, Dr. Felecia Shelling. Pt reports that she knows what was going on through out incident and remembers everything but just had trouble speaking afterwards. Pt is non-smoker.     Past Medical History  Diagnosis Date  . Cardiomyopathy     Nonischemic, EF 25-30% 2007 / EF improved 50% 2008, ( catheterization not done) nuclear 2007, fixed anteroapical defect, no ischemia  . Ejection fraction     EF 50%, echo, 2008  . Hypertension   . Pulmonary hypertension   . ASD (atrial septal defect)     Small  . Edema     Chronic lower extremity edema... venous insufficiency  . Hypothyroidism   . Stroke   . Diverticulitis   . Gout    Past Surgical History  Procedure Laterality Date  . Abdominal hysterectomy     Family History  Problem Relation Age of Onset  . Coronary artery disease      POSITIVE HX IN FAMILY   History  Substance Use Topics  . Smoking status: Never Smoker   . Smokeless tobacco: Never Used  . Alcohol  Use: No   OB History    No data available     Review of Systems  Constitutional: Negative for appetite change and fatigue.  HENT: Negative for congestion, ear discharge and sinus pressure.   Eyes: Negative for discharge.  Respiratory: Negative for cough.   Cardiovascular: Positive for syncope. Negative for chest pain.  Gastrointestinal: Negative for abdominal pain and diarrhea.  Genitourinary: Negative for frequency and hematuria.  Musculoskeletal: Negative for back pain.  Skin: Negative for rash.  Neurological: Positive for dizziness, syncope and speech difficulty. Negative for seizures and headaches.  Psychiatric/Behavioral: Negative for hallucinations.      Allergies  Aspirin  Home Medications   Prior to Admission medications   Medication Sig Start Date End Date Taking? Authorizing Provider  acetaminophen (TYLENOL) 500 MG tablet Take 500 mg by mouth every 6 (six) hours as needed for mild pain or moderate pain (for leg pain).    Historical Provider, MD  acetaminophen-codeine (TYLENOL #3) 300-30 MG per tablet Take 1 tablet by mouth 2 (two) times daily as needed for moderate pain.    Historical Provider, MD  ALPRAZolam Prudy Feeler) 0.5 MG tablet Take 0.5 mg by mouth 2 (two) times daily.    Historical Provider, MD  carvedilol (COREG) 25 MG tablet Take 25 mg by mouth daily.  Historical Provider, MD  diphenhydrAMINE (BENADRYL) 25 mg capsule Take 25 mg by mouth every 6 (six) hours as needed for itching or allergies.    Historical Provider, MD  furosemide (LASIX) 40 MG tablet Take 40 mg by mouth 2 (two) times daily.      Historical Provider, MD  hydrALAZINE (APRESOLINE) 50 MG tablet Take 50 mg by mouth 2 (two) times daily.    Historical Provider, MD  levothyroxine (SYNTHROID, LEVOTHROID) 75 MCG tablet Take 75 mcg by mouth daily.      Historical Provider, MD  lisinopril (PRINIVIL,ZESTRIL) 20 MG tablet Take 20 mg by mouth daily.      Historical Provider, MD  meclizine (ANTIVERT) 25 MG  tablet Take 25 mg by mouth 3 (three) times daily as needed.  06/13/13   Historical Provider, MD  meloxicam (MOBIC) 7.5 MG tablet Take 7.5 mg by mouth every morning.    Historical Provider, MD  OVER THE COUNTER MEDICATION Take 1 capsule by mouth daily. OMEGA XL (Contains Green Lipped Mussel Oil Extract and 30 free fatty acids)    Historical Provider, MD  tobramycin (TOBREX) 0.3 % ophthalmic solution Place 2 drops into the left eye every 4 (four) hours. 12/15/13   Doug Sou, MD  vitamin B-12 (CYANOCOBALAMIN) 1000 MCG tablet Take 1,000 mcg by mouth daily.    Historical Provider, MD   BP 134/72 mmHg  Pulse 65  Temp(Src) 97.8 F (36.6 C) (Oral)  Resp 20  Ht 5\' 5"  (1.651 m)  Wt 160 lb (72.576 kg)  BMI 26.63 kg/m2  SpO2 100% Physical Exam  Constitutional: She is oriented to person, place, and time. She appears well-developed.  HENT:  Head: Normocephalic.  Eyes: Conjunctivae and EOM are normal. No scleral icterus.  Neck: Neck supple. No thyromegaly present.  Cardiovascular: Normal rate and regular rhythm.  Exam reveals no gallop and no friction rub.   No murmur heard. Pulmonary/Chest: No stridor. She has no wheezes. She has no rales. She exhibits no tenderness.  Abdominal: She exhibits no distension. There is no tenderness. There is no rebound.  Musculoskeletal: Normal range of motion. She exhibits edema.  1+ edema in ankles.   Lymphadenopathy:    She has no cervical adenopathy.  Neurological: She is oriented to person, place, and time. She exhibits normal muscle tone. Coordination normal.  Skin: No rash noted. No erythema.  Psychiatric: She has a normal mood and affect. Her behavior is normal.    ED Course  Procedures (including critical care time) Labs Review Labs Reviewed - No data to display  Imaging Review No results found.   EKG Interpretation   Date/Time:  Tuesday September 01 2014 16:23:41 EST Ventricular Rate:  64 PR Interval:  181 QRS Duration: 111 QT Interval:   441 QTC Calculation: 455 R Axis:   61 Text Interpretation:  Sinus rhythm Incomplete left bundle branch block  Confirmed by Aundrea Horace  MD, Keymani Mclean [phone]) on 09/01/2014 6:00:00 PM     Medications - No data to display  4:30 PM- Treatment plan was discussed with patient who verbalizes understanding and agrees.   MDM   Final diagnoses:  None    Near syncope with vertigo symptoms improved. Mild dehydration,    Pt to follow up with pcp this week   I personally performed the services described in this documentation, which was scribed in my presence. The recorded information has been reviewed and is accurate.      [name] Lennert, MD 09/01/14 1807  [name] Lennert,  MD 09/01/14 [id number]

## 2014-09-01 NOTE — Discharge Instructions (Signed)
Follow up with your md this week.  Return as needed

## 2014-09-07 ENCOUNTER — Encounter (HOSPITAL_COMMUNITY): Payer: Self-pay | Admitting: Emergency Medicine

## 2014-09-07 ENCOUNTER — Inpatient Hospital Stay (HOSPITAL_COMMUNITY)
Admission: EM | Admit: 2014-09-07 | Discharge: 2014-09-12 | DRG: 392 | Disposition: A | Discharge status: Home Health | Payer: Medicare HMO | Attending: Internal Medicine | Admitting: Internal Medicine

## 2014-09-07 ENCOUNTER — Emergency Department (HOSPITAL_COMMUNITY): Payer: Medicare HMO

## 2014-09-07 DIAGNOSIS — Q211 Atrial septal defect, unspecified: Secondary | ICD-10-CM

## 2014-09-07 DIAGNOSIS — R197 Diarrhea, unspecified: Secondary | ICD-10-CM | POA: Diagnosis present

## 2014-09-07 DIAGNOSIS — N179 Acute kidney failure, unspecified: Secondary | ICD-10-CM | POA: Diagnosis present

## 2014-09-07 DIAGNOSIS — I272 Other secondary pulmonary hypertension: Secondary | ICD-10-CM | POA: Diagnosis present

## 2014-09-07 DIAGNOSIS — Z8673 Personal history of transient ischemic attack (TIA), and cerebral infarction without residual deficits: Secondary | ICD-10-CM

## 2014-09-07 DIAGNOSIS — I129 Hypertensive chronic kidney disease with stage 1 through stage 4 chronic kidney disease, or unspecified chronic kidney disease: Secondary | ICD-10-CM | POA: Diagnosis present

## 2014-09-07 DIAGNOSIS — R112 Nausea with vomiting, unspecified: Secondary | ICD-10-CM | POA: Diagnosis present

## 2014-09-07 DIAGNOSIS — E039 Hypothyroidism, unspecified: Secondary | ICD-10-CM | POA: Diagnosis present

## 2014-09-07 DIAGNOSIS — E876 Hypokalemia: Secondary | ICD-10-CM | POA: Diagnosis present

## 2014-09-07 DIAGNOSIS — I428 Other cardiomyopathies: Secondary | ICD-10-CM

## 2014-09-07 DIAGNOSIS — R1030 Lower abdominal pain, unspecified: Secondary | ICD-10-CM

## 2014-09-07 DIAGNOSIS — N189 Chronic kidney disease, unspecified: Secondary | ICD-10-CM | POA: Diagnosis present

## 2014-09-07 DIAGNOSIS — Z8249 Family history of ischemic heart disease and other diseases of the circulatory system: Secondary | ICD-10-CM

## 2014-09-07 DIAGNOSIS — I10 Essential (primary) hypertension: Secondary | ICD-10-CM | POA: Diagnosis present

## 2014-09-07 DIAGNOSIS — K529 Noninfective gastroenteritis and colitis, unspecified: Principal | ICD-10-CM | POA: Diagnosis present

## 2014-09-07 DIAGNOSIS — R109 Unspecified abdominal pain: Secondary | ICD-10-CM | POA: Diagnosis present

## 2014-09-07 DIAGNOSIS — I429 Cardiomyopathy, unspecified: Secondary | ICD-10-CM

## 2014-09-07 LAB — CBC WITH DIFFERENTIAL/PLATELET
BASOS ABS: 0 10*3/uL (ref 0.0–0.1)
Basophils Relative: 0 % (ref 0–1)
Eosinophils Absolute: 0.1 10*3/uL (ref 0.0–0.7)
Eosinophils Relative: 1 % (ref 0–5)
HEMATOCRIT: 34.8 % — AB (ref 36.0–46.0)
HEMOGLOBIN: 11.5 g/dL — AB (ref 12.0–15.0)
LYMPHS PCT: 20 % (ref 12–46)
Lymphs Abs: 1 10*3/uL (ref 0.7–4.0)
MCH: 29.9 pg (ref 26.0–34.0)
MCHC: 33 g/dL (ref 30.0–36.0)
MCV: 90.6 fL (ref 78.0–100.0)
MONO ABS: 0.5 10*3/uL (ref 0.1–1.0)
MONOS PCT: 11 % (ref 3–12)
NEUTROS ABS: 3.3 10*3/uL (ref 1.7–7.7)
Neutrophils Relative %: 68 % (ref 43–77)
Platelets: 151 10*3/uL (ref 150–400)
RBC: 3.84 MIL/uL — ABNORMAL LOW (ref 3.87–5.11)
RDW: 13.3 % (ref 11.5–15.5)
WBC: 4.9 10*3/uL (ref 4.0–10.5)

## 2014-09-07 LAB — COMPREHENSIVE METABOLIC PANEL
ALK PHOS: 50 U/L (ref 39–117)
ALT: 11 U/L (ref 0–35)
ANION GAP: 6 (ref 5–15)
AST: 16 U/L (ref 0–37)
Albumin: 3.9 g/dL (ref 3.5–5.2)
BILIRUBIN TOTAL: 0.7 mg/dL (ref 0.3–1.2)
BUN: 34 mg/dL — ABNORMAL HIGH (ref 6–23)
CO2: 24 mmol/L (ref 19–32)
Calcium: 8.8 mg/dL (ref 8.4–10.5)
Chloride: 109 mEq/L (ref 96–112)
Creatinine, Ser: 1.37 mg/dL — ABNORMAL HIGH (ref 0.50–1.10)
GFR calc Af Amer: 39 mL/min — ABNORMAL LOW (ref >90)
GFR calc non Af Amer: 34 mL/min — ABNORMAL LOW (ref >90)
Glucose, Bld: 96 mg/dL (ref 70–99)
POTASSIUM: 3.5 mmol/L (ref 3.5–5.1)
Sodium: 139 mmol/L (ref 135–145)
Total Protein: 7 g/dL (ref 6.0–8.3)

## 2014-09-07 LAB — URINALYSIS, ROUTINE W REFLEX MICROSCOPIC
BILIRUBIN URINE: NEGATIVE
GLUCOSE, UA: NEGATIVE mg/dL
Hgb urine dipstick: NEGATIVE
Ketones, ur: NEGATIVE mg/dL
LEUKOCYTES UA: NEGATIVE
NITRITE: NEGATIVE
PH: 5.5 (ref 5.0–8.0)
Protein, ur: NEGATIVE mg/dL
Specific Gravity, Urine: 1.015 (ref 1.005–1.030)
Urobilinogen, UA: 0.2 mg/dL (ref 0.0–1.0)

## 2014-09-07 LAB — TSH: TSH: 0.073 u[IU]/mL — AB (ref 0.350–4.500)

## 2014-09-07 LAB — CLOSTRIDIUM DIFFICILE BY PCR: Toxigenic C. Difficile by PCR: NEGATIVE

## 2014-09-07 LAB — LIPASE, BLOOD: Lipase: 20 U/L (ref 11–59)

## 2014-09-07 MED ORDER — MECLIZINE HCL 12.5 MG PO TABS: 25.0000 mg | ORAL_TABLET | Freq: Three times a day (TID) | ORAL | Status: DC | PRN

## 2014-09-07 MED ORDER — SODIUM CHLORIDE 0.9 % IV BOLUS (SEPSIS)
500.0000 mL | Freq: Once | INTRAVENOUS | Status: AC
  Administered 2014-09-07: 500 mL via INTRAVENOUS

## 2014-09-07 MED ORDER — IOHEXOL 300 MG/ML  SOLN
50.0000 mL | Freq: Once | INTRAMUSCULAR | Status: AC | PRN
  Administered 2014-09-07: 50 mL via ORAL

## 2014-09-07 MED ORDER — VITAMIN B-12 1000 MCG PO TABS
1000.0000 ug | ORAL_TABLET | Freq: Every day | ORAL | Status: DC
  Administered 2014-09-07 – 2014-09-12 (×6): 1000 ug via ORAL
  Filled 2014-09-07 (×8): qty 1

## 2014-09-07 MED ORDER — CIPROFLOXACIN IN D5W 400 MG/200ML IV SOLN
400.0000 mg | INTRAVENOUS | Status: DC
  Administered 2014-09-07 – 2014-09-10 (×4): 400 mg via INTRAVENOUS
  Filled 2014-09-07 (×4): qty 200

## 2014-09-07 MED ORDER — TRAZODONE HCL 50 MG PO TABS
25.0000 mg | ORAL_TABLET | Freq: Every evening | ORAL | Status: DC | PRN
  Administered 2014-09-09: 25 mg via ORAL
  Filled 2014-09-07: qty 1

## 2014-09-07 MED ORDER — METRONIDAZOLE IN NACL 5-0.79 MG/ML-% IV SOLN
500.0000 mg | Freq: Three times a day (TID) | INTRAVENOUS | Status: DC
  Administered 2014-09-07 – 2014-09-12 (×15): 500 mg via INTRAVENOUS
  Filled 2014-09-07 (×15): qty 100

## 2014-09-07 MED ORDER — ALUM & MAG HYDROXIDE-SIMETH 200-200-20 MG/5ML PO SUSP: 30.0000 mL | Freq: Four times a day (QID) | ORAL | Status: DC | PRN

## 2014-09-07 MED ORDER — ALPRAZOLAM 0.5 MG PO TABS
0.5000 mg | ORAL_TABLET | Freq: Two times a day (BID) | ORAL | Status: DC
  Administered 2014-09-07 – 2014-09-12 (×10): 0.5 mg via ORAL
  Filled 2014-09-07 (×10): qty 1

## 2014-09-07 MED ORDER — ONDANSETRON HCL 4 MG/2ML IJ SOLN: 4.0000 mg | Freq: Once | INTRAMUSCULAR | Status: AC

## 2014-09-07 MED ORDER — TOBRAMYCIN 0.3 % OP SOLN
2.0000 [drp] | OPHTHALMIC | Status: DC
  Administered 2014-09-07 – 2014-09-12 (×30): 2 [drp] via OPHTHALMIC
  Filled 2014-09-07 (×3): qty 5

## 2014-09-07 MED ORDER — DIPHENHYDRAMINE HCL 25 MG PO CAPS: 25.0000 mg | ORAL_CAPSULE | Freq: Four times a day (QID) | ORAL | Status: DC | PRN

## 2014-09-07 MED ORDER — LEVOTHYROXINE SODIUM 75 MCG PO TABS
75.0000 ug | ORAL_TABLET | Freq: Every day | ORAL | Status: DC
  Administered 2014-09-07 – 2014-09-12 (×6): 75 ug via ORAL
  Filled 2014-09-07 (×6): qty 1

## 2014-09-07 MED ORDER — ENOXAPARIN SODIUM 30 MG/0.3ML ~~LOC~~ SOLN
30.0000 mg | SUBCUTANEOUS | Status: DC
  Administered 2014-09-07 – 2014-09-11 (×5): 30 mg via SUBCUTANEOUS
  Filled 2014-09-07 (×5): qty 0.3

## 2014-09-07 MED ORDER — CARVEDILOL 12.5 MG PO TABS
25.0000 mg | ORAL_TABLET | Freq: Two times a day (BID) | ORAL | Status: DC
  Administered 2014-09-07 – 2014-09-12 (×10): 25 mg via ORAL
  Filled 2014-09-07 (×10): qty 2

## 2014-09-07 MED ORDER — ONDANSETRON HCL 4 MG/2ML IJ SOLN
4.0000 mg | Freq: Once | INTRAMUSCULAR | Status: DC
  Administered 2014-09-07: 4 mg via INTRAVENOUS

## 2014-09-07 MED ORDER — MORPHINE SULFATE 2 MG/ML IJ SOLN
1.0000 mg | INTRAMUSCULAR | Status: DC | PRN
  Administered 2014-09-07 – 2014-09-08 (×2): 1 mg via INTRAVENOUS
  Filled 2014-09-07 (×2): qty 1

## 2014-09-07 MED ORDER — HYDROCODONE-ACETAMINOPHEN 5-325 MG PO TABS
1.0000 | ORAL_TABLET | ORAL | Status: DC | PRN
  Administered 2014-09-08: 1 via ORAL
  Filled 2014-09-07: qty 1

## 2014-09-07 MED ORDER — ONDANSETRON HCL 4 MG/2ML IJ SOLN
4.0000 mg | Freq: Once | INTRAMUSCULAR | Status: DC
  Filled 2014-09-07: qty 2

## 2014-09-07 MED ORDER — ONDANSETRON HCL 4 MG/2ML IJ SOLN
4.0000 mg | Freq: Four times a day (QID) | INTRAMUSCULAR | Status: DC | PRN
  Administered 2014-09-08: 4 mg via INTRAVENOUS
  Filled 2014-09-07: qty 2

## 2014-09-07 MED ORDER — IOHEXOL 300 MG/ML  SOLN
80.0000 mL | Freq: Once | INTRAMUSCULAR | Status: AC | PRN
  Administered 2014-09-07: 80 mL via INTRAVENOUS

## 2014-09-07 MED ORDER — ACETAMINOPHEN 650 MG RE SUPP: 650.0000 mg | Freq: Four times a day (QID) | RECTAL | Status: DC | PRN

## 2014-09-07 MED ORDER — ACETAMINOPHEN 325 MG PO TABS: 650.0000 mg | ORAL_TABLET | Freq: Four times a day (QID) | ORAL | Status: DC | PRN

## 2014-09-07 MED ORDER — ONDANSETRON HCL 4 MG PO TABS
4.0000 mg | ORAL_TABLET | Freq: Four times a day (QID) | ORAL | Status: DC | PRN
  Administered 2014-09-10: 4 mg via ORAL
  Filled 2014-09-07: qty 1

## 2014-09-07 MED ORDER — SODIUM CHLORIDE 0.9 % IV SOLN
INTRAVENOUS | Status: DC
  Administered 2014-09-07: 17:00:00 via INTRAVENOUS

## 2014-09-07 NOTE — ED Notes (Signed)
PT c/o lower abdominal pain with diarrhea x2 days. PT denies any urinary symptoms. PT denies any n/v.

## 2014-09-07 NOTE — Progress Notes (Signed)
ANTIBIOTIC CONSULT NOTE - INITIAL  Pharmacy Consult for Cipro Indication: Intra abdominal infection  Allergies  Allergen Reactions  . Aspirin Other (See Comments)    unknown    Patient Measurements: Height: 5\' 4"  (162.6 cm) Weight: 151 lb (68.493 kg) IBW/kg (Calculated) : 54.7 Adjusted Body Weight:   Vital Signs: Temp: 98.7 F (37.1 C) (01/04 1426) Temp Source: Oral (01/04 1426) BP: 114/67 mmHg (01/04 1426) Pulse Rate: 77 (01/04 1426) Intake/Output from previous day:   Intake/Output from this shift:    Labs:  Recent Labs  09/07/14 1123  WBC 4.9  HGB 11.5*  PLT 151  CREATININE 1.37*   Estimated Creatinine Clearance: 27.5 mL/min (by C-G formula based on Cr of 1.37). No results for input(s): VANCOTROUGH, VANCOPEAK, VANCORANDOM, GENTTROUGH, GENTPEAK, GENTRANDOM, TOBRATROUGH, TOBRAPEAK, TOBRARND, AMIKACINPEAK, AMIKACINTROU, AMIKACIN in the last 72 hours.   Microbiology: No results found for this or any previous visit (from the past 720 hour(s)).  Medical History: Past Medical History  Diagnosis Date  . Cardiomyopathy     Nonischemic, EF 25-30% 2007 / EF improved 50% 2008, ( catheterization not done) nuclear 2007, fixed anteroapical defect, no ischemia  . Ejection fraction     EF 50%, echo, 2008  . Hypertension   . Pulmonary hypertension   . ASD (atrial septal defect)     Small  . Edema     Chronic lower extremity edema... venous insufficiency  . Hypothyroidism   . Stroke   . Diverticulitis   . Gout     Medications:  Prescriptions prior to admission  Medication Sig Dispense Refill Last Dose  . ALPRAZolam (XANAX) 0.5 MG tablet Take 0.5 mg by mouth 2 (two) times daily.   09/06/2014 at Unknown time  . carvedilol (COREG) 25 MG tablet Take 25 mg by mouth 2 (two) times daily with a meal.    09/06/2014 at 800  . furosemide (LASIX) 40 MG tablet Take 40 mg by mouth 2 (two) times daily.     09/06/2014 at Unknown time  . hydrALAZINE (APRESOLINE) 50 MG tablet Take 50 mg  by mouth 2 (two) times daily.   09/06/2014 at Unknown time  . levothyroxine (SYNTHROID, LEVOTHROID) 75 MCG tablet Take 75 mcg by mouth daily.     09/06/2014 at Unknown time  . lisinopril (PRINIVIL,ZESTRIL) 20 MG tablet Take 20 mg by mouth daily.     09/06/2014 at Unknown time  . vitamin B-12 (CYANOCOBALAMIN) 1000 MCG tablet Take 1,000 mcg by mouth daily.   09/06/2014 at Unknown time  . acetaminophen (TYLENOL) 500 MG tablet Take 500 mg by mouth every 6 (six) hours as needed for mild pain or moderate pain (for leg pain).   unknown  . acetaminophen-codeine (TYLENOL #3) 300-30 MG per tablet Take 1 tablet by mouth 2 (two) times daily as needed for moderate pain.   09/04/2014  . diphenhydrAMINE (BENADRYL) 25 mg capsule Take 25 mg by mouth every 6 (six) hours as needed for itching or allergies.   unknown  . meclizine (ANTIVERT) 25 MG tablet Take 25 mg by mouth 3 (three) times daily as needed for dizziness.    unknown  . tobramycin (TOBREX) 0.3 % ophthalmic solution Place 2 drops into the left eye every 4 (four) hours. (Patient not taking: Reported on 09/01/2014) 5 mL 0    Scheduled:  . ALPRAZolam  0.5 mg Oral BID  . carvedilol  25 mg Oral BID WC  . ciprofloxacin  400 mg Intravenous Q24H  . enoxaparin (LOVENOX) injection  30 mg Subcutaneous Q24H  . levothyroxine  75 mcg Oral QAC breakfast  . metronidazole  500 mg Intravenous Q8H  . tobramycin  2 drop Left Eye 6 times per day  . vitamin B-12  1,000 mcg Oral Daily   Assessment:  Patient reports diagnosis of diverticulosis in the past. Initial evaluation in the emergency department includes CT of the abdomen concerning for colitis. Requested pharmacy protocol for intra abdominal infection Reduced renal function. CrCl less than 30 ml/min  Goal of Therapy:  Eradicate infection  Plan:  Cipro 400 mg IV every 24 hours Monitor renal function Labs per protocol  Raquel James, Gemini Bunte Bennett 09/07/2014,4:25 PM

## 2014-09-07 NOTE — H&P (Signed)
[facility] History and Physical  [name]:[id number] DOB: 26-Sep-1924 DOA: 09/07/2014  Referring physician:  PCP: Avon Gully, MD   Chief Complaint: diarrhea/vomiting  HPI: [name] is a delightful 81 y.o. female with a past medical history that includes nonischemic cardiomyopathy EF 50% 2008, hypertension, atrial septal defect, hypothyroidism, diverticulosis, presents emergency department with the chief complaint of persistent diarrhea and vomiting. Initial evaluation in the emergency department includes CT of the abdomen concerning for colitis. Patient reports diagnosis of diverticulosis in the past and has intermittent issues with diarrhea but was in her usual state of health until last night when she developed sudden diarrhea. She had 8-10 episodes. She denies bloody diarrhea. She describes it as watery and pale yellow. Associated symptoms include nausea without vomiting abdominal cramping with bowel movements as well as decreased by mouth intake. She denies a chest pain palpitations shortness of breath. She denies fever chills dysuria hematuria frequency or urgency. In the emergency department she did have one episode of emesis. Non-coffee ground emesis noted. Workup reveals a creatinine of 1.3 BUN 34 hemoglobin 11.5. She is hemodynamically stable afebrile and not hypoxic.  Review of Systems:  10 point review of systems complete and all systems are negative except as indicated in the history of present illness  Past Medical History  Diagnosis Date  . Cardiomyopathy     Nonischemic, EF 25-30% 2007 / EF improved 50% 2008, ( catheterization not done) nuclear 2007, fixed anteroapical defect, no ischemia  . Ejection fraction     EF 50%, echo, 2008  . Hypertension   . Pulmonary hypertension   . ASD (atrial septal defect)     Small  . Edema     Chronic lower extremity edema... venous insufficiency  . Hypothyroidism   . Stroke   . Diverticulitis   . Gout     Past Surgical History  Procedure Laterality Date  . Abdominal hysterectomy    . Breast surgery     Social History:  reports that she has never smoked. She has never used smokeless tobacco. She reports that she does not drink alcohol or use illicit drugs. She lives at home alone she does have in-house help family nearby  Allergies  Allergen Reactions  . Aspirin Other (See Comments)    unknown    Family History  Problem Relation Age of Onset  . Coronary artery disease      POSITIVE HX IN FAMILY     Prior to Admission medications   Medication Sig Start Date End Date Taking? Authorizing Provider  ALPRAZolam Prudy Feeler) 0.5 MG tablet Take 0.5 mg by mouth 2 (two) times daily.   Yes Historical Provider, MD  carvedilol (COREG) 25 MG tablet Take 25 mg by mouth 2 (two) times daily with a meal.    Yes Historical Provider, MD  furosemide (LASIX) 40 MG tablet Take 40 mg by mouth 2 (two) times daily.     Yes Historical Provider, MD  hydrALAZINE (APRESOLINE) 50 MG tablet Take 50 mg by mouth 2 (two) times daily.   Yes Historical Provider, MD  levothyroxine (SYNTHROID, LEVOTHROID) 75 MCG tablet Take 75 mcg by mouth daily.     Yes Historical Provider, MD  lisinopril (PRINIVIL,ZESTRIL) 20 MG tablet Take 20 mg by mouth daily.     Yes Historical Provider, MD  vitamin B-12 (CYANOCOBALAMIN) 1000 MCG tablet Take 1,000 mcg by mouth daily.   Yes Historical Provider, MD  acetaminophen (TYLENOL) 500 MG tablet Take 500 mg by mouth every  6 (six) hours as needed for mild pain or moderate pain (for leg pain).    Historical Provider, MD  acetaminophen-codeine (TYLENOL #3) 300-30 MG per tablet Take 1 tablet by mouth 2 (two) times daily as needed for moderate pain.    Historical Provider, MD  diphenhydrAMINE (BENADRYL) 25 mg capsule Take 25 mg by mouth every 6 (six) hours as needed for itching or allergies.    Historical Provider, MD  meclizine (ANTIVERT) 25 MG tablet Take 25 mg by mouth 3 (three) times daily as needed  for dizziness.  06/13/13   Historical Provider, MD  tobramycin (TOBREX) 0.3 % ophthalmic solution Place 2 drops into the left eye every 4 (four) hours. Patient not taking: Reported on 09/01/2014 12/15/13   Doug Sou, MD   Physical Exam: Filed Vitals:   09/07/14 1300 09/07/14 1330 09/07/14 1400 09/07/14 1426  BP: 117/53 127/65 114/67 114/67  Pulse: 70 74 76 77  Temp:    98.7 F (37.1 C)  TempSrc:    Oral  Resp:   16 16  Height:      Weight:      SpO2: 97% 99% 90% 100%    Wt Readings from Last 3 Encounters:  09/07/14 68.493 kg (151 lb)  09/01/14 72.576 kg (160 lb)  12/15/13 70.761 kg (156 lb)    General:  Appears calm and comfortable Eyes: PERRL, normal lids, irises & conjunctiva ENT: grossly normal hearing, lips & tongue Neck: no LAD, masses or thyromegaly Cardiovascular: RRR, no m/r/g. No LE edema.  Respiratory: CTA bilaterally, no w/r/r. Normal respiratory effort. Abdomen: Mildly distended very sluggish bowel sounds moderate tenderness in left lower quadrant.  Skin: no rash or induration seen on limited exam Musculoskeletal: grossly normal tone BUE/BLE Psychiatric: grossly normal mood and affect, speech fluent and appropriate Neurologic: grossly non-focal. speech clear facial symmetry some obvious short-term memory deficits           Labs on Admission:  Basic Metabolic Panel:  Recent Labs Lab 09/01/14 1640 09/07/14 1123  NA 145 139  K 3.7 3.5  CL 108 109  CO2 30 24  GLUCOSE 121* 96  BUN 41* 34*  CREATININE 1.49* 1.37*  CALCIUM 9.0 8.8   Liver Function Tests:  Recent Labs Lab 09/01/14 1640 09/07/14 1123  AST 15 16  ALT 10 11  ALKPHOS 60 50  BILITOT 0.6 0.7  PROT 6.8 7.0  ALBUMIN 3.9 3.9   No results for input(s): LIPASE, AMYLASE in the last 168 hours. No results for input(s): AMMONIA in the last 168 hours. CBC:  Recent Labs Lab 09/01/14 1640 09/07/14 1123  WBC 5.2 4.9  NEUTROABS 3.7 3.3  HGB 10.7* 11.5*  HCT 32.4* 34.8*  MCV 91.0  90.6  PLT 148* 151   Cardiac Enzymes:  Recent Labs Lab 09/01/14 1640  TROPONINI <0.03    BNP (last 3 results)  Recent Labs  12/15/13 1706  PROBNP 131.8   CBG: No results for input(s): GLUCAP in the last 168 hours.  Radiological Exams on Admission: Ct Abdomen Pelvis W Contrast  09/07/2014   CLINICAL DATA:  Abdominal pain and diarrhea.  Initial encounter.  EXAM: CT ABDOMEN AND PELVIS WITH CONTRAST  TECHNIQUE: Multidetector CT imaging of the abdomen and pelvis was performed using the standard protocol following bolus administration of intravenous contrast.  CONTRAST:  50mL OMNIPAQUE IOHEXOL 300 MG/ML SOLN, 80mL OMNIPAQUE IOHEXOL 300 MG/ML SOLN reduced dose contrast was used secondary to renal insufficiency.  COMPARISON:  None.  FINDINGS: Musculoskeletal: No  aggressive osseous lesions. L4-L5 and L5-S1 predominant lumbar spondylosis and facet arthrosis.  Lung Bases: Atelectasis.  Liver: Mild intrahepatic biliary ductal dilation is present. No mass lesion.  Spleen:  Normal.  Gallbladder: Markedly distended which may be secondary to fasting state.  Common bile duct: Within normal limits for age. No calcified stone identified.  Pancreas:  Normal.  Adrenal glands: Mild thickening of the LEFT adrenal gland without a discrete nodule compatible with hyperplasia.  Kidneys: Bilateral simple renal cysts. Normal enhancement and delayed excretion of contrast. LEFT ureter normal. RIGHT ureter normal.  Stomach:  Distended with oral contrast.  No inflammatory changes.  Small bowel: Duodenum appears normal. Jejunum is also normal. There is thickening of the terminal ileum with a small amount of fluid surrounding the terminal ileum in the anatomic pelvis. Mural thickening measures up to 8 mm. Stranding is present in the mesenteric fat.  Colon: Mural thickening of the ascending colon is present. The hepatic flexure the colon and transverse cul are mildly dilated with contrast and stool mixed producing fluid levels.  Colonic diverticulosis is present. No focal inflammatory changes to suggest acute diverticulitis. There may be some mural thickening of the sigmoid colon present as well however evaluation is difficult and the thickening may be secondary to diverticular hypertrophy.  Pelvic Genitourinary: Hysterectomy. Urinary bladder is mostly collapsed but appears within normal limits.  Peritoneum: No intra-abdominal free air. Small amount of free fluid in the anatomic pelvis, probably reactive.  Vasculature: Atherosclerosis. Visceral atherosclerosis is also present. The visible mesenteric vessels opacified normally.  Body Wall: Fat containing periumbilical hernia.  IMPRESSION: 1. Short segment thickening of the terminal ileum and another segment of proximal colonic mural thickening. The differential considerations are ischemia, enteric infection with inflammatory bowel disease less likely. 2. Distended gallbladder and mild intrahepatic biliary ductal dilation. Correlation with bilirubin recommended. If normal, no further evaluation warranted. This recommendation follows ACR consensus guidelines: White Paper of the ACR Incidental Findings Committee II on Gallbladder and Biliary Findings. J Am Coll Radiol 2013:;10:953-956. 3. Hysterectomy. 4. Bilateral renal cysts.   Electronically Signed   By: Andreas Newport M.D.   On: 09/07/2014 13:45    EKG:   Assessment/Plan Principal Problem:   Diarrhea; no episodes since presentation. Likely related to colitis in patient with history of diverticulosis. Gently hydrate provide IV antibiotics and clear liquids as tolerated. Obtain stool sample for GI pathogen and C. Difficile. Active Problems: Colitis: CT consistent with colitis. History of diverticulosis. See #1.  Nausea & vomiting; related to #1. On exam abdomen is slightly distended with decreased bowel sounds. She also had one episode of vomiting in the emergency department. Will provide Zofran. If further vomiting will consider  NG tube.    Acute renal failure: I cleaned setting of chronic kidney disease. Related to decreased by mouth intake. Will hold any nephrotoxins and gently hydrate with IV fluids. Monitor intake and output. If no improvement will consider renal ultrasound   Abdominal pain; related to colitis in patient with history of diverticulosis. Antibiotics as noted above. Improved at the time of my exam. Pain medication as needed    Nonischemic cardiomyopathy: EF 50% 2008 according to chart. Appears stable at baseline. Monitor intake and output    Hypertension: Controlled at the time of my exam. I medications include Coreg Lasix hydralazine lisinopril. I will continue the Coreg and hold Lasix hydralazine and lisinopril. Monitor closely and resume medications as indicated    Hypothyroidism; obtain TSH continue Synthroid    ASD (  atrial septal defect): Stable        Code Status: DNR DVT Prophylaxis: Family Communication: daughter at bedside Disposition Plan: home when ready  Time spent: 46 minutes  [facility] Pager [phone]

## 2014-09-07 NOTE — ED Provider Notes (Signed)
CSN: [id number]     Arrival date & time 09/07/14  1033 History  This chart was scribed for [employer]. Rubin Payor, MD by Ronney Lion, ED Scribe. This patient was seen in room APA14/APA14 and the patient's care was started at 11:30 AM.    Chief Complaint  Patient presents with  . Abdominal Pain   The history is provided by the patient and a relative. No language interpreter was used.     HPI Comments: [name] is a 81 y.o. female with a history of diverticulitis who presents to the Emergency Department complaining of sudden onset lower abdominal pain and diarrhea last night. Patient states her last normal was yesterday. Per family member, patient has had intermittent bowel problems for months. Patient denies fever, nausea, and vomiting. She also denies hospital admisision, recent antibiotic use, and sick contact. Patient ate a little breakfast this morning.    Past Medical History  Diagnosis Date  . Cardiomyopathy     Nonischemic, EF 25-30% 2007 / EF improved 50% 2008, ( catheterization not done) nuclear 2007, fixed anteroapical defect, no ischemia  . Ejection fraction     EF 50%, echo, 2008  . Hypertension   . Pulmonary hypertension   . ASD (atrial septal defect)     Small  . Edema     Chronic lower extremity edema... venous insufficiency  . Hypothyroidism   . Stroke   . Diverticulitis   . Gout    Past Surgical History  Procedure Laterality Date  . Abdominal hysterectomy    . Breast surgery     Family History  Problem Relation Age of Onset  . Coronary artery disease      POSITIVE HX IN FAMILY   History  Substance Use Topics  . Smoking status: Never Smoker   . Smokeless tobacco: Never Used  . Alcohol Use: No   OB History    No data available     Review of Systems  Constitutional: Positive for appetite change. Negative for fever.  Gastrointestinal: Positive for abdominal pain and diarrhea. Negative for nausea and vomiting.  Genitourinary: Negative for dysuria and  difficulty urinating.      Allergies  Aspirin  Home Medications   Prior to Admission medications   Medication Sig Start Date End Date Taking? Authorizing Provider  ALPRAZolam Prudy Feeler) 0.5 MG tablet Take 0.5 mg by mouth 2 (two) times daily.   Yes Historical Provider, MD  carvedilol (COREG) 25 MG tablet Take 25 mg by mouth 2 (two) times daily with a meal.    Yes Historical Provider, MD  furosemide (LASIX) 40 MG tablet Take 40 mg by mouth 2 (two) times daily.     Yes Historical Provider, MD  hydrALAZINE (APRESOLINE) 50 MG tablet Take 50 mg by mouth 2 (two) times daily.   Yes Historical Provider, MD  levothyroxine (SYNTHROID, LEVOTHROID) 75 MCG tablet Take 75 mcg by mouth daily.     Yes Historical Provider, MD  lisinopril (PRINIVIL,ZESTRIL) 20 MG tablet Take 20 mg by mouth daily.     Yes Historical Provider, MD  vitamin B-12 (CYANOCOBALAMIN) 1000 MCG tablet Take 1,000 mcg by mouth daily.   Yes Historical Provider, MD  acetaminophen (TYLENOL) 500 MG tablet Take 500 mg by mouth every 6 (six) hours as needed for mild pain or moderate pain (for leg pain).    Historical Provider, MD  acetaminophen-codeine (TYLENOL #3) 300-30 MG per tablet Take 1 tablet by mouth 2 (two) times daily as needed for moderate pain.  Historical Provider, MD  diphenhydrAMINE (BENADRYL) 25 mg capsule Take 25 mg by mouth every 6 (six) hours as needed for itching or allergies.    Historical Provider, MD  meclizine (ANTIVERT) 25 MG tablet Take 25 mg by mouth 3 (three) times daily as needed for dizziness.  06/13/13   Historical Provider, MD  tobramycin (TOBREX) 0.3 % ophthalmic solution Place 2 drops into the left eye every 4 (four) hours. Patient not taking: Reported on 09/01/2014 12/15/13   Doug Sou, MD   BP 114/67 mmHg  Pulse 77  Temp(Src) 98.7 F (37.1 C) (Oral)  Resp 16  Ht  (1.626 m)  Wt 151 lb (68.493 kg)  BMI 25.91 kg/m2  SpO2 100% Physical Exam  Constitutional: She is oriented to person, place, and  time. She appears well-developed and well-nourished. No distress.  HENT:  Head: Normocephalic and atraumatic.  Mouth/Throat: Oropharynx is clear and moist.  Eyes: Conjunctivae and EOM are normal.  Neck: Neck supple. No tracheal deviation present.  Cardiovascular: Normal rate and regular rhythm.   Pulmonary/Chest: Effort normal. No respiratory distress.  Abdominal: She exhibits distension (mild). There is tenderness (lower abdominal). There is no rebound and no guarding.  Musculoskeletal: Normal range of motion. She exhibits edema (mild BLE pitting edema).  Neurological: She is alert and oriented to person, place, and time.  Skin: Skin is warm and dry.  Psychiatric: She has a normal mood and affect. Her behavior is normal.  Nursing note and vitals reviewed.   ED Course  Procedures (including critical care time)  DIAGNOSTIC STUDIES: Oxygen Saturation is 98% on room air, normal by my interpretation.    COORDINATION OF CARE: 11:32 AM - Discussed treatment plan with pt at bedside which includes fluids and possible tests and pt agreed to plan.   Labs Review Labs Reviewed  CBC WITH DIFFERENTIAL - Abnormal; Notable for the following:    RBC 3.84 (*)    Hemoglobin 11.5 (*)    HCT 34.8 (*)    All other components within normal limits  COMPREHENSIVE METABOLIC PANEL - Abnormal; Notable for the following:    BUN 34 (*)    Creatinine, Ser 1.37 (*)    GFR calc non Af Amer 34 (*)    GFR calc Af Amer 39 (*)    All other components within normal limits  CLOSTRIDIUM DIFFICILE BY PCR  URINALYSIS, ROUTINE W REFLEX MICROSCOPIC    Imaging Review Ct Abdomen Pelvis W Contrast  09/07/2014   CLINICAL DATA:  Abdominal pain and diarrhea.  Initial encounter.  EXAM: CT ABDOMEN AND PELVIS WITH CONTRAST  TECHNIQUE: Multidetector CT imaging of the abdomen and pelvis was performed using the standard protocol following bolus administration of intravenous contrast.  CONTRAST:  50mL OMNIPAQUE IOHEXOL 300 MG/ML  SOLN, 80mL OMNIPAQUE IOHEXOL 300 MG/ML SOLN reduced dose contrast was used secondary to renal insufficiency.  COMPARISON:  None.  FINDINGS: Musculoskeletal: No aggressive osseous lesions. L4-L5 and L5-S1 predominant lumbar spondylosis and facet arthrosis.  Lung Bases: Atelectasis.  Liver: Mild intrahepatic biliary ductal dilation is present. No mass lesion.  Spleen:  Normal.  Gallbladder: Markedly distended which may be secondary to fasting state.  Common bile duct: Within normal limits for age. No calcified stone identified.  Pancreas:  Normal.  Adrenal glands: Mild thickening of the LEFT adrenal gland without a discrete nodule compatible with hyperplasia.  Kidneys: Bilateral simple renal cysts. Normal enhancement and delayed excretion of contrast. LEFT ureter normal. RIGHT ureter normal.  Stomach:  Distended with  oral contrast.  No inflammatory changes.  Small bowel: Duodenum appears normal. Jejunum is also normal. There is thickening of the terminal ileum with a small amount of fluid surrounding the terminal ileum in the anatomic pelvis. Mural thickening measures up to 8 mm. Stranding is present in the mesenteric fat.  Colon: Mural thickening of the ascending colon is present. The hepatic flexure the colon and transverse cul are mildly dilated with contrast and stool mixed producing fluid levels. Colonic diverticulosis is present. No focal inflammatory changes to suggest acute diverticulitis. There may be some mural thickening of the sigmoid colon present as well however evaluation is difficult and the thickening may be secondary to diverticular hypertrophy.  Pelvic Genitourinary: Hysterectomy. Urinary bladder is mostly collapsed but appears within normal limits.  Peritoneum: No intra-abdominal free air. Small amount of free fluid in the anatomic pelvis, probably reactive.  Vasculature: Atherosclerosis. Visceral atherosclerosis is also present. The visible mesenteric vessels opacified normally.  Body Wall: Fat  containing periumbilical hernia.  IMPRESSION: 1. Short segment thickening of the terminal ileum and another segment of proximal colonic mural thickening. The differential considerations are ischemia, enteric infection with inflammatory bowel disease less likely. 2. Distended gallbladder and mild intrahepatic biliary ductal dilation. Correlation with bilirubin recommended. If normal, no further evaluation warranted. This recommendation follows ACR consensus guidelines: White Paper of the ACR Incidental Findings Committee II on Gallbladder and Biliary Findings. J Am Coll Radiol 2013:;10:953-956. 3. Hysterectomy. 4. Bilateral renal cysts.   Electronically Signed   By: Andreas Newport M.D.   On: 09/07/2014 13:45     EKG Interpretation None      MDM   Final diagnoses:  Lower abdominal pain  Colitis  Diarrhea   patient with diarrhea and weakness. Lower abdominal pain. CT scan done and shows colitis and ileitis. Patient is not thriving at home. Will admit to internal medicine.  I personally performed the services described in this documentation, which was scribed in my presence. The recorded information has been reviewed and is accurate.      Juliet Rude. Rubin Payor, MD 09/07/14 1556

## 2014-09-07 NOTE — ED Notes (Signed)
Patient vomiting in room. Advised Dr Rubin Payor. Verbal order for zofran 4 mg IV now.

## 2014-09-07 NOTE — ED Notes (Signed)
Cleaned patient's gown and bed. Patient tolerated well.

## 2014-09-07 NOTE — ED Notes (Signed)
Patient complaining of diarrhea and abdominal pain. Family member states "she has had this problem off and on for months."

## 2014-09-08 LAB — BASIC METABOLIC PANEL
Anion gap: 9 (ref 5–15)
BUN: 31 mg/dL — ABNORMAL HIGH (ref 6–23)
CO2: 19 mmol/L (ref 19–32)
CREATININE: 1.45 mg/dL — AB (ref 0.50–1.10)
Calcium: 8.2 mg/dL — ABNORMAL LOW (ref 8.4–10.5)
Chloride: 114 mEq/L — ABNORMAL HIGH (ref 96–112)
GFR calc Af Amer: 36 mL/min — ABNORMAL LOW (ref >90)
GFR calc non Af Amer: 31 mL/min — ABNORMAL LOW (ref >90)
Glucose, Bld: 114 mg/dL — ABNORMAL HIGH (ref 70–99)
Potassium: 2.6 mmol/L — CL (ref 3.5–5.1)
SODIUM: 142 mmol/L (ref 135–145)

## 2014-09-08 LAB — GI PATHOGEN PANEL BY PCR, STOOL
C difficile toxin A/B: NEGATIVE
Campylobacter by PCR: NEGATIVE
Cryptosporidium by PCR: NEGATIVE
E coli (ETEC) LT/ST: NEGATIVE
E coli (STEC): NEGATIVE
E coli 0157 by PCR: NEGATIVE
G lamblia by PCR: NEGATIVE
NOROVIRUS G1/G2: NEGATIVE
ROTAVIRUS A BY PCR: NEGATIVE
Salmonella by PCR: NEGATIVE
Shigella by PCR: NEGATIVE

## 2014-09-08 LAB — CBC
HCT: 31.8 % — ABNORMAL LOW (ref 36.0–46.0)
Hemoglobin: 10.6 g/dL — ABNORMAL LOW (ref 12.0–15.0)
MCH: 29.9 pg (ref 26.0–34.0)
MCHC: 33.3 g/dL (ref 30.0–36.0)
MCV: 89.8 fL (ref 78.0–100.0)
Platelets: 143 10*3/uL — ABNORMAL LOW (ref 150–400)
RBC: 3.54 MIL/uL — ABNORMAL LOW (ref 3.87–5.11)
RDW: 13.3 % (ref 11.5–15.5)
WBC: 4.8 10*3/uL (ref 4.0–10.5)

## 2014-09-08 MED ORDER — POTASSIUM CHLORIDE 10 MEQ/100ML IV SOLN
10.0000 meq | INTRAVENOUS | Status: AC
  Administered 2014-09-08 (×5): 10 meq via INTRAVENOUS
  Filled 2014-09-08 (×5): qty 100

## 2014-09-08 MED ORDER — ONDANSETRON HCL 4 MG/2ML IJ SOLN
4.0000 mg | Freq: Three times a day (TID) | INTRAMUSCULAR | Status: DC
  Administered 2014-09-08 – 2014-09-12 (×10): 4 mg via INTRAVENOUS
  Filled 2014-09-08 (×11): qty 2

## 2014-09-08 MED ORDER — PANTOPRAZOLE SODIUM 40 MG PO TBEC
40.0000 mg | DELAYED_RELEASE_TABLET | Freq: Two times a day (BID) | ORAL | Status: DC
  Administered 2014-09-08 – 2014-09-12 (×8): 40 mg via ORAL
  Filled 2014-09-08 (×8): qty 1

## 2014-09-08 MED ORDER — POTASSIUM CHLORIDE IN NACL 40-0.9 MEQ/L-% IV SOLN
INTRAVENOUS | Status: DC
  Administered 2014-09-08 – 2014-09-10 (×4): 75 mL/h via INTRAVENOUS

## 2014-09-08 MED ORDER — BOOST PLUS PO LIQD
237.0000 mL | Freq: Three times a day (TID) | ORAL | Status: DC
  Administered 2014-09-08: 237 mL via ORAL
  Filled 2014-09-08 (×3): qty 237

## 2014-09-08 NOTE — Consult Note (Signed)
@LOGO @   Referring Provider: No ref. provider found Primary Care Physician:  Avon Gully, MD Primary Gastroenterologist:  Dr.  Date of Admission: 09/07/13 Date of Consultation: 09/08/13  Reason for Consultation:  Colitis  HPI:   81 year old [name] seen for consult on colitis. PMH includes diverticulosis and NICM EF 50% on 2008. Per patient family the patient has been having "stomach issues" for about the past week with some crampy pain and diarrhea. States when she feels her diverticulosis getting gworse she usually takes a couple Tylenol #3 and it self resolves without need for further intervention however this did not work this time. 2 days ago developed sudden onset of 8-10 episodes of pale yellow diarrhea along with nausea, decreased appetite, and abdominal pain.  Vomited once at home. In the emergency room had an additional episode of non-coffee ground emesis. Denies fever or sick contacts up until that point. Denies hematemesis, hematochezia, and melena. Has been hemodynamically stable. Was admitted and has been on clear liquid diet.  CT showed CBD within normal limits, normal pancreas, thickening of the terminal ileum and ascending colon without suggestion of acute diverticulitis.  Today she is having continued nausea but without vomiting. Has an episode of abdominal pain this morning which resolved with prn pain medication. Otherwise seems to be tolerating clear liquid diet fairly. Had one episode of watery non-blood diarrhea last night and then again this morning which is improved in number from home at onset of symptoms. Appetite is fair at this point.  Past Medical History  Diagnosis Date  . Cardiomyopathy     Nonischemic, EF 25-30% 2007 / EF improved 50% 2008, ( catheterization not done) nuclear 2007, fixed anteroapical defect, no ischemia  . Ejection fraction     EF 50%, echo, 2008  . Hypertension   . Pulmonary hypertension   . ASD (atrial septal defect)     Small  . Edema    Chronic lower extremity edema... venous insufficiency  . Hypothyroidism   . Stroke   . Diverticulitis   . Gout     Past Surgical History  Procedure Laterality Date  . Abdominal hysterectomy    . Breast surgery      Prior to Admission medications   Medication Sig Start Date End Date Taking? Authorizing Provider  ALPRAZolam Prudy Feeler) 0.5 MG tablet Take 0.5 mg by mouth 2 (two) times daily.   Yes Historical Provider, MD  carvedilol (COREG) 25 MG tablet Take 25 mg by mouth 2 (two) times daily with a meal.    Yes Historical Provider, MD  furosemide (LASIX) 40 MG tablet Take 40 mg by mouth 2 (two) times daily.     Yes Historical Provider, MD  hydrALAZINE (APRESOLINE) 50 MG tablet Take 50 mg by mouth 2 (two) times daily.   Yes Historical Provider, MD  levothyroxine (SYNTHROID, LEVOTHROID) 75 MCG tablet Take 75 mcg by mouth daily.     Yes Historical Provider, MD  lisinopril (PRINIVIL,ZESTRIL) 20 MG tablet Take 20 mg by mouth daily.     Yes Historical Provider, MD  vitamin B-12 (CYANOCOBALAMIN) 1000 MCG tablet Take 1,000 mcg by mouth daily.   Yes Historical Provider, MD  acetaminophen (TYLENOL) 500 MG tablet Take 500 mg by mouth every 6 (six) hours as needed for mild pain or moderate pain (for leg pain).    Historical Provider, MD  acetaminophen-codeine (TYLENOL #3) 300-30 MG per tablet Take 1 tablet by mouth 2 (two) times daily as needed for moderate pain.  Historical Provider, MD  diphenhydrAMINE (BENADRYL) 25 mg capsule Take 25 mg by mouth every 6 (six) hours as needed for itching or allergies.    Historical Provider, MD  meclizine (ANTIVERT) 25 MG tablet Take 25 mg by mouth 3 (three) times daily as needed for dizziness.  06/13/13   Historical Provider, MD  tobramycin (TOBREX) 0.3 % ophthalmic solution Place 2 drops into the left eye every 4 (four) hours. Patient not taking: Reported on 09/01/2014 12/15/13   Doug Sou, MD    Current Facility-Administered Medications  Medication Dose Route  Frequency Provider Last Rate Last Dose  . 0.9 % NaCl with KCl 40 mEq / L  infusion   Intravenous Continuous Avon Gully, MD 75 mL/hr at 09/08/14 0843 75 mL/hr at 09/08/14 0843  . acetaminophen (TYLENOL) tablet 650 mg  650 mg Oral Q6H PRN Gwenyth Bender, NP       Or  . acetaminophen (TYLENOL) suppository 650 mg  650 mg Rectal Q6H PRN Gwenyth Bender, NP      . ALPRAZolam Prudy Feeler) tablet 0.5 mg  0.5 mg Oral BID Lesle Chris Black, NP   0.5 mg at 09/08/14 1019  . alum & mag hydroxide-simeth (MAALOX/MYLANTA) 200-200-20 MG/5ML suspension 30 mL  30 mL Oral Q6H PRN Gwenyth Bender, NP      . carvedilol (COREG) tablet 25 mg  25 mg Oral BID WC Gwenyth Bender, NP   25 mg at 09/08/14 0843  . ciprofloxacin (CIPRO) IVPB 400 mg  400 mg Intravenous Q24H Avon Gully, MD   400 mg at 09/07/14 1825  . diphenhydrAMINE (BENADRYL) capsule 25 mg  25 mg Oral Q6H PRN Lesle Chris Black, NP      . enoxaparin (LOVENOX) injection 30 mg  30 mg Subcutaneous Q24H Lesle Chris Black, NP   30 mg at 09/07/14 1700  . HYDROcodone-acetaminophen (NORCO/VICODIN) 5-325 MG per tablet 1-2 tablet  1-2 tablet Oral Q4H PRN Gwenyth Bender, NP      . levothyroxine (SYNTHROID, LEVOTHROID) tablet 75 mcg  75 mcg Oral QAC breakfast Gwenyth Bender, NP   75 mcg at 09/08/14 0844  . meclizine (ANTIVERT) tablet 25 mg  25 mg Oral TID PRN Gwenyth Bender, NP      . metroNIDAZOLE (FLAGYL) IVPB 500 mg  500 mg Intravenous Q8H Lesle Chris Black, NP   500 mg at 09/08/14 0843  . morphine 2 MG/ML injection 1 mg  1 mg Intravenous Q3H PRN Gwenyth Bender, NP   1 mg at 09/08/14 1211  . ondansetron (ZOFRAN) tablet 4 mg  4 mg Oral Q6H PRN Gwenyth Bender, NP       Or  . ondansetron [facility]) injection 4 mg  4 mg Intravenous Q6H PRN Gwenyth Bender, NP   4 mg at 09/08/14 1329  . tobramycin (TOBREX) 0.3 % ophthalmic solution 2 drop  2 drop Left Eye 6 times per day Gwenyth Bender, NP   2 drop at 09/08/14 1329  . traZODone (DESYREL) tablet 25 mg  25 mg Oral QHS PRN Gwenyth Bender, NP      . vitamin  B-12 (CYANOCOBALAMIN) tablet 1,000 mcg  1,000 mcg Oral Daily Lesle Chris Black, NP   1,000 mcg at 09/08/14 1019    Allergies as of 09/07/2014 - Review Complete 09/07/2014  Allergen Reaction Noted  . Aspirin Other (See Comments) 02/07/2011    Family History  Problem Relation Age of Onset  . Coronary artery disease  POSITIVE HX IN FAMILY    History   Social History  . Marital Status: Widowed    Spouse Name: N/A    Number of Children: N/A  . Years of Education: N/A   Occupational History  . RETIRED    Social History Main Topics  . Smoking status: Never Smoker   . Smokeless tobacco: Never Used  . Alcohol Use: No  . Drug Use: No  . Sexual Activity: Not on file   Other Topics Concern  . Not on file   Social History Narrative    Review of Systems: Gen: Denies fever, chills. Admits decreased appetite as of late. CV: Denies chest pain, heart palpitations, syncope. Admits some LE edema.  Resp: Denies shortness of breath with rest, cough, wheezing GI: Denies dysphagia or odynophagia. Denies vomiting blood, jaundice, and fecal incontinence.  MS: Denies joint pain,swelling. Derm: Denies rash, itching, dry skin Psych: Denies depression, anxiety,confusion, or memory loss Heme: Denies bruising, bleeding, and enlarged lymph nodes.  Physical Exam: Vital signs in last 24 hours: Temp:  [98.7 F (37.1 C)-99.8 F (37.7 C)] 99.5 F (37.5 C) (01/05 1344) Pulse Rate:  [67-84] 74 (01/05 1344) Resp:  [16-18] 18 (01/05 1344) BP: (114-155)/(46-67) 126/59 mmHg (01/05 1344) SpO2:  [95 %-100 %] 95 % (01/05 1344) Last BM Date: 09/07/14 General:   Alert,  Well-developed, well-nourished, pleasant and cooperative in NAD Head:  Normocephalic and atraumatic. Eyes:  Sclera clear, no icterus.   Conjunctiva pink. Ears:  Normal auditory acuity. Lungs:  Clear throughout to auscultation.   No wheezes, crackles, or rhonchi. No acute distress. Heart:  Regular rate and rhythm; no murmurs, clicks,  rubs,  or gallops. Abdomen:  Soft  but mildly distended. Some TTP primarily LLQ and LUQ. No masses, hepatosplenomegaly or hernias noted. Normal bowel sounds, without guarding, and without rebound.   Rectal:  Deferred.    Msk:  Symmetrical without gross deformities. Pulses:  Normal DP pulses noted. Extremities:  Without clubbing. Mild LE non-pitting edema. Neurologic:  Alert and  oriented x4. Some short term memory/recall delay. Skin:  Intact without significant lesions or rashes. Psych:  Alert and cooperative. Normal mood and affect.  Intake/Output from previous day: 01/04 0701 - 01/05 0700 In: 537.5 [P.O.:120; I.V.:117.5; IV Piggyback:300] Out: -  Intake/Output this shift: Total I/O In: 240 [P.O.:240] Out: 1 [Blood:1]  Lab Results:  Recent Labs  09/07/14 1123 09/08/14 0639  WBC 4.9 4.8  HGB 11.5* 10.6*  HCT 34.8* 31.8*  PLT 151 143*   BMET  Recent Labs  09/07/14 1123 09/08/14 0639  NA 139 142  K 3.5 2.6*  CL 109 114*  CO2 24 19  GLUCOSE 96 114*  BUN 34* 31*  CREATININE 1.37* 1.45*  CALCIUM 8.8 8.2*   LFT  Recent Labs  09/07/14 1123  PROT 7.0  ALBUMIN 3.9  AST 16  ALT 11  ALKPHOS 50  BILITOT 0.7   PT/INR No results for input(s): LABPROT, INR in the last 72 hours. Hepatitis Panel No results for input(s): HEPBSAG, HCVAB, HEPAIGM, HEPBIGM in the last 72 hours. C-Diff No results for input(s): CDIFFTOX in the last 72 hours.  Studies/Results: Ct Abdomen Pelvis W Contrast  09/07/2014   CLINICAL DATA:  Abdominal pain and diarrhea.  Initial encounter.  EXAM: CT ABDOMEN AND PELVIS WITH CONTRAST  TECHNIQUE: Multidetector CT imaging of the abdomen and pelvis was performed using the standard protocol following bolus administration of intravenous contrast.  CONTRAST:  50mL OMNIPAQUE IOHEXOL 300 MG/ML SOLN, 80mL  OMNIPAQUE IOHEXOL 300 MG/ML SOLN reduced dose contrast was used secondary to renal insufficiency.  COMPARISON:  None.  FINDINGS: Musculoskeletal: No  aggressive osseous lesions. L4-L5 and L5-S1 predominant lumbar spondylosis and facet arthrosis.  Lung Bases: Atelectasis.  Liver: Mild intrahepatic biliary ductal dilation is present. No mass lesion.  Spleen:  Normal.  Gallbladder: Markedly distended which may be secondary to fasting state.  Common bile duct: Within normal limits for age. No calcified stone identified.  Pancreas:  Normal.  Adrenal glands: Mild thickening of the LEFT adrenal gland without a discrete nodule compatible with hyperplasia.  Kidneys: Bilateral simple renal cysts. Normal enhancement and delayed excretion of contrast. LEFT ureter normal. RIGHT ureter normal.  Stomach:  Distended with oral contrast.  No inflammatory changes.  Small bowel: Duodenum appears normal. Jejunum is also normal. There is thickening of the terminal ileum with a small amount of fluid surrounding the terminal ileum in the anatomic pelvis. Mural thickening measures up to 8 mm. Stranding is present in the mesenteric fat.  Colon: Mural thickening of the ascending colon is present. The hepatic flexure the colon and transverse cul are mildly dilated with contrast and stool mixed producing fluid levels. Colonic diverticulosis is present. No focal inflammatory changes to suggest acute diverticulitis. There may be some mural thickening of the sigmoid colon present as well however evaluation is difficult and the thickening may be secondary to diverticular hypertrophy.  Pelvic Genitourinary: Hysterectomy. Urinary bladder is mostly collapsed but appears within normal limits.  Peritoneum: No intra-abdominal free air. Small amount of free fluid in the anatomic pelvis, probably reactive.  Vasculature: Atherosclerosis. Visceral atherosclerosis is also present. The visible mesenteric vessels opacified normally.  Body Wall: Fat containing periumbilical hernia.  IMPRESSION: 1. Short segment thickening of the terminal ileum and another segment of proximal colonic mural thickening. The  differential considerations are ischemia, enteric infection with inflammatory bowel disease less likely. 2. Distended gallbladder and mild intrahepatic biliary ductal dilation. Correlation with bilirubin recommended. If normal, no further evaluation warranted. This recommendation follows ACR consensus guidelines: White Paper of the ACR Incidental Findings Committee II on Gallbladder and Biliary Findings. J Am Coll Radiol 2013:;10:953-956. 3. Hysterectomy. 4. Bilateral renal cysts.   Electronically Signed   By: Andreas Newport M.D.   On: 09/07/2014 13:45    Impression:  81 year old [name] with history of diverticulosis admitted for colitis based on CT and clinical presentation. On clear liquids and tolerating this fairly well although with one episode of abdominal pain this morning and continued mild nausea. WBC normal. Slight decline in H/H although not grossly out of her baseline range could likely be from rehydration. Colitis etiology could include ischemic given cardiomyopathy and less likely infectious due to normal WBC and no s/s of acute infection.  Plan:  1. Add bid PPI 2. Add Zofran  before each meal 3. Continue clear liquids for now and monitor for tolerance with Zofran   Wynne Dust, AGNP-C Adult & Gerontological Nurse Practitioner [facility]    LOS: 1 day     09/08/2014, 2:01 PM

## 2014-09-08 NOTE — Progress Notes (Signed)
Subjective: Patient was admitted yesterday due to diarrhea, abdominal pain and nausea. C.diffi toxin is negative. CT of the abdomen is showing thickening of the terminal ileum and proximal colon. She is started of flagyl and cipro and IV fluid. No fever or chills.  Objective: Vital signs in last 24 hours: Temp:  [98.2 F (36.8 C)-99.8 F (37.7 C)] 98.7 F (37.1 C) (01/05 0547) Pulse Rate:  [67-84] 67 (01/05 0547) Resp:  [16-18] 18 (01/05 0547) BP: (109-155)/(46-67) 115/46 mmHg (01/05 0547) SpO2:  [90 %-100 %] 97 % (01/05 0547) Weight:  [68.493 kg (151 lb)] 68.493 kg (151 lb) (01/04 1041) Weight change:  Last BM Date: 09/07/14  Intake/Output from previous day: 01/04 0701 - 01/05 0700 In: 537.5 [P.O.:120; I.V.:117.5; IV Piggyback:300] Out: -   PHYSICAL EXAM General appearance: alert, cooperative and no distress Resp: clear to auscultation bilaterally Cardio: S1, S2 normal GI: abdomen is soft and lax, bowel sound is ++, mild lower quadrants tenderness Extremities: extremities normal, atraumatic, no cyanosis or edema  Lab Results:  Results for orders placed or performed during the hospital encounter of 09/07/14 (from the past 48 hour(s))  CBC with Differential     Status: Abnormal   Collection Time: 09/07/14 11:23 AM  Result Value Ref Range   WBC 4.9 4.0 - 10.5 K/uL   RBC 3.84 (L) 3.87 - 5.11 MIL/uL   Hemoglobin 11.5 (L) 12.0 - 15.0 g/dL   HCT 34.8 (L) 36.0 - 46.0 %   MCV 90.6 78.0 - 100.0 fL   MCH 29.9 26.0 - 34.0 pg   MCHC 33.0 30.0 - 36.0 g/dL   RDW 13.3 11.5 - 15.5 %   Platelets 151 150 - 400 K/uL   Neutrophils Relative % 68 43 - 77 %   Neutro Abs 3.3 1.7 - 7.7 K/uL   Lymphocytes Relative 20 12 - 46 %   Lymphs Abs 1.0 0.7 - 4.0 K/uL   Monocytes Relative 11 3 - 12 %   Monocytes Absolute 0.5 0.1 - 1.0 K/uL   Eosinophils Relative 1 0 - 5 %   Eosinophils Absolute 0.1 0.0 - 0.7 K/uL   Basophils Relative 0 0 - 1 %   Basophils Absolute 0.0 0.0 - 0.1 K/uL  Comprehensive  metabolic panel     Status: Abnormal   Collection Time: 09/07/14 11:23 AM  Result Value Ref Range   Sodium 139 135 - 145 mmol/L    Comment: Please note change in reference range.   Potassium 3.5 3.5 - 5.1 mmol/L    Comment: Please note change in reference range.   Chloride 109 96 - 112 mEq/L   CO2 24 19 - 32 mmol/L   Glucose, Bld 96 70 - 99 mg/dL   BUN 34 (H) 6 - 23 mg/dL   Creatinine, Ser 1.37 (H) 0.50 - 1.10 mg/dL   Calcium 8.8 8.4 - 10.5 mg/dL   Total Protein 7.0 6.0 - 8.3 g/dL   Albumin 3.9 3.5 - 5.2 g/dL   AST 16 0 - 37 U/L   ALT 11 0 - 35 U/L   Alkaline Phosphatase 50 39 - 117 U/L   Total Bilirubin 0.7 0.3 - 1.2 mg/dL   GFR calc non Af Amer 34 (L) >90 mL/min   GFR calc Af Amer 39 (L) >90 mL/min    Comment: (NOTE) The eGFR has been calculated using the CKD EPI equation. This calculation has not been validated in all clinical situations. eGFR's persistently <90 mL/min signify possible Chronic Kidney Disease.  Anion gap 6 5 - 15  TSH     Status: Abnormal   Collection Time: 09/07/14 11:23 AM  Result Value Ref Range   TSH 0.073 (L) 0.350 - 4.500 uIU/mL  Lipase, blood     Status: None   Collection Time: 09/07/14 11:23 AM  Result Value Ref Range   Lipase 20 11 - 59 U/L  Urinalysis, Routine w reflex microscopic     Status: None   Collection Time: 09/07/14 12:12 PM  Result Value Ref Range   Color, Urine YELLOW YELLOW   APPearance CLEAR CLEAR   Specific Gravity, Urine 1.015 1.005 - 1.030   pH 5.5 5.0 - 8.0   Glucose, UA NEGATIVE NEGATIVE mg/dL   Hgb urine dipstick NEGATIVE NEGATIVE   Bilirubin Urine NEGATIVE NEGATIVE   Ketones, ur NEGATIVE NEGATIVE mg/dL   Protein, ur NEGATIVE NEGATIVE mg/dL   Urobilinogen, UA 0.2 0.0 - 1.0 mg/dL   Nitrite NEGATIVE NEGATIVE   Leukocytes, UA NEGATIVE NEGATIVE    Comment: MICROSCOPIC NOT DONE ON URINES WITH NEGATIVE PROTEIN, BLOOD, LEUKOCYTES, NITRITE, OR GLUCOSE <1000 mg/dL.  Clostridium Difficile by PCR     Status: None   Collection  Time: 09/07/14  3:41 PM  Result Value Ref Range   C difficile by pcr NEGATIVE NEGATIVE  CBC     Status: Abnormal   Collection Time: 09/08/14  6:39 AM  Result Value Ref Range   WBC 4.8 4.0 - 10.5 K/uL   RBC 3.54 (L) 3.87 - 5.11 MIL/uL   Hemoglobin 10.6 (L) 12.0 - 15.0 g/dL   HCT 31.8 (L) 36.0 - 46.0 %   MCV 89.8 78.0 - 100.0 fL   MCH 29.9 26.0 - 34.0 pg   MCHC 33.3 30.0 - 36.0 g/dL   RDW 13.3 11.5 - 15.5 %   Platelets 143 (L) 150 - 400 K/uL  Basic metabolic panel     Status: Abnormal   Collection Time: 09/08/14  6:39 AM  Result Value Ref Range   Sodium 142 135 - 145 mmol/L    Comment: Please note change in reference range.   Potassium 2.6 (LL) 3.5 - 5.1 mmol/L    Comment: CRITICAL RESULT CALLED TO, READ BACK BY AND VERIFIED WITH: HEARN,J AT 7:20AM ON 09/08/14 BY FESTERMAN,C Please note change in reference range.    Chloride 114 (H) 96 - 112 mEq/L   CO2 19 19 - 32 mmol/L   Glucose, Bld 114 (H) 70 - 99 mg/dL   BUN 31 (H) 6 - 23 mg/dL   Creatinine, Ser 1.45 (H) 0.50 - 1.10 mg/dL   Calcium 8.2 (L) 8.4 - 10.5 mg/dL   GFR calc non Af Amer 31 (L) >90 mL/min   GFR calc Af Amer 36 (L) >90 mL/min    Comment: (NOTE) The eGFR has been calculated using the CKD EPI equation. This calculation has not been validated in all clinical situations. eGFR's persistently <90 mL/min signify possible Chronic Kidney Disease.    Anion gap 9 5 - 15    ABGS No results for input(s): PHART, PO2ART, TCO2, HCO3 in the last 72 hours.  Invalid input(s): PCO2 CULTURES Recent Results (from the past 240 hour(s))  Clostridium Difficile by PCR     Status: None   Collection Time: 09/07/14  3:41 PM  Result Value Ref Range Status   C difficile by pcr NEGATIVE NEGATIVE Final   Studies/Results: Ct Abdomen Pelvis W Contrast  09/07/2014   CLINICAL DATA:  Abdominal pain and diarrhea.  Initial encounter.  EXAM: CT ABDOMEN AND PELVIS WITH CONTRAST  TECHNIQUE: Multidetector CT imaging of the abdomen and pelvis was  performed using the standard protocol following bolus administration of intravenous contrast.  CONTRAST:  75m OMNIPAQUE IOHEXOL 300 MG/ML SOLN, 814mOMNIPAQUE IOHEXOL 300 MG/ML SOLN reduced dose contrast was used secondary to renal insufficiency.  COMPARISON:  None.  FINDINGS: Musculoskeletal: No aggressive osseous lesions. L4-L5 and L5-S1 predominant lumbar spondylosis and facet arthrosis.  Lung Bases: Atelectasis.  Liver: Mild intrahepatic biliary ductal dilation is present. No mass lesion.  Spleen:  Normal.  Gallbladder: Markedly distended which may be secondary to fasting state.  Common bile duct: Within normal limits for age. No calcified stone identified.  Pancreas:  Normal.  Adrenal glands: Mild thickening of the LEFT adrenal gland without a discrete nodule compatible with hyperplasia.  Kidneys: Bilateral simple renal cysts. Normal enhancement and delayed excretion of contrast. LEFT ureter normal. RIGHT ureter normal.  Stomach:  Distended with oral contrast.  No inflammatory changes.  Small bowel: Duodenum appears normal. Jejunum is also normal. There is thickening of the terminal ileum with a small amount of fluid surrounding the terminal ileum in the anatomic pelvis. Mural thickening measures up to 8 mm. Stranding is present in the mesenteric fat.  Colon: Mural thickening of the ascending colon is present. The hepatic flexure the colon and transverse cul are mildly dilated with contrast and stool mixed producing fluid levels. Colonic diverticulosis is present. No focal inflammatory changes to suggest acute diverticulitis. There may be some mural thickening of the sigmoid colon present as well however evaluation is difficult and the thickening may be secondary to diverticular hypertrophy.  Pelvic Genitourinary: Hysterectomy. Urinary bladder is mostly collapsed but appears within normal limits.  Peritoneum: No intra-abdominal free air. Small amount of free fluid in the anatomic pelvis, probably reactive.   Vasculature: Atherosclerosis. Visceral atherosclerosis is also present. The visible mesenteric vessels opacified normally.  Body Wall: Fat containing periumbilical hernia.  IMPRESSION: 1. Short segment thickening of the terminal ileum and another segment of proximal colonic mural thickening. The differential considerations are ischemia, enteric infection with inflammatory bowel disease less likely. 2. Distended gallbladder and mild intrahepatic biliary ductal dilation. Correlation with bilirubin recommended. If normal, no further evaluation warranted. This recommendation follows ACR consensus guidelines: White Paper of the ACR Incidental Findings Committee II on Gallbladder and Biliary Findings. J Am Coll Radiol 2013:;10:953-956. 3. Hysterectomy. 4. Bilateral renal cysts.   Electronically Signed   By: GeDereck Ligas.D.   On: 09/07/2014 13:45    Medications: I have reviewed the patient's current medications.  Assesment:   Principal Problem:   Diarrhea Active Problems:   Nonischemic cardiomyopathy   Hypertension   Hypothyroidism   ASD (atrial septal defect)   Colitis   Abdominal pain   Nausea & vomiting   Acute renal failure Hypokalemia ?colitis   Plan:  Medications reviewed Will supplement KCL Will monitor BMP GI consult    LOS: 1 day   Krikor Willet 09/08/2014, 8:18 AM

## 2014-09-08 NOTE — Progress Notes (Signed)
UR chart review completed.  

## 2014-09-08 NOTE — Care Management Note (Signed)
    Page 1 of 2   09/11/2014     3:11:04 PM CARE MANAGEMENT NOTE 09/11/2014  Patient:  [name], [name]   Account Number:  [id number]  Date Initiated:  09/08/2014  Documentation initiated by:  Sharrie Rothman  Subjective/Objective Assessment:   Pt admitted from home with colitis. Pt lives alone but is thinking of going to live with her son in [place] at discharge. Pt has a cane and walker for home use.     Action/Plan:   PT consult ordered. Will continue to follow for discharge planning needs.   Anticipated DC Date:  09/11/2014   Anticipated DC Plan:  HOME W HOME HEALTH SERVICES      DC Planning Services  CM consult      [facility] Choice  HOME HEALTH   Choice offered to / List presented to:  C-1 Patient        HH arranged  HH-1 RN  HH-2 PT      [facility] agency  [facility]   Status of service:  Completed, signed off Medicare Important Message given?  YES (If response is "NO", the following Medicare IM given date fields will be blank) Date Medicare IM given:  09/11/2014 Medicare IM given by:  Sharrie Rothman Date Additional Medicare IM given:   Additional Medicare IM given by:    Discharge Disposition:  HOME W HOME HEALTH SERVICES  Per UR Regulation:    If discussed at Long Length of Stay Meetings, dates discussed:    Comments:  09/11/14 1505 [name], RN BSN CM Pt and pts daughter stated that pt would be returning home at discharge since pts son is working out of town and will not be able to have pt come stay with him at discharge. Pts daugher, Suzette Battiest, will be staying with pt for a while. Pt has chosen [facility] for RN and PT. [name] of [facility] is aware of referral and will collect the pts information from the chart. Hh services to start within 48 hours of discharge. No DME needs noted. Weekend staff to call and fax orders once written. Pt and pts nurse aware of discharge arrangements. Private duty agency list given to pts daughter as well.  09/08/14 1515 [name], RN BSN CM

## 2014-09-09 LAB — BASIC METABOLIC PANEL
ANION GAP: 5 (ref 5–15)
BUN: 24 mg/dL — ABNORMAL HIGH (ref 6–23)
CO2: 17 mmol/L — ABNORMAL LOW (ref 19–32)
Calcium: 8.4 mg/dL (ref 8.4–10.5)
Chloride: 119 mEq/L — ABNORMAL HIGH (ref 96–112)
Creatinine, Ser: 1.7 mg/dL — ABNORMAL HIGH (ref 0.50–1.10)
GFR calc non Af Amer: 26 mL/min — ABNORMAL LOW (ref >90)
GFR, EST AFRICAN AMERICAN: 30 mL/min — AB (ref >90)
Glucose, Bld: 103 mg/dL — ABNORMAL HIGH (ref 70–99)
Potassium: 3.5 mmol/L (ref 3.5–5.1)
Sodium: 141 mmol/L (ref 135–145)

## 2014-09-09 MED ORDER — ENSURE COMPLETE PO LIQD
237.0000 mL | Freq: Three times a day (TID) | ORAL | Status: DC
  Administered 2014-09-09 – 2014-09-12 (×10): 237 mL via ORAL

## 2014-09-09 NOTE — Progress Notes (Signed)
Subjective: Patient feels better today. Her nausea, abdominal pain and diarrhea is subsiding. She is tolerating full liquid diet. No fever or chills.  Objective: Vital signs in last 24 hours: Temp:  [98.2 F (36.8 C)-99.5 F (37.5 C)] 98.2 F (36.8 C) (01/06 0525) Pulse Rate:  [70-74] 70 (01/06 0525) Resp:  [18-20] 20 (01/06 0525) BP: (115-126)/(45-59) 115/45 mmHg (01/06 0525) SpO2:  [95 %-98 %] 98 % (01/06 0525) Weight change:  Last BM Date: 09/08/14  Intake/Output from previous day: 01/05 0701 - 01/06 0700 In: 1887.5 [P.O.:360; I.V.:1127.5; IV Piggyback:400] Out: 1 [Blood:1]  PHYSICAL EXAM General appearance: alert, cooperative and no distress Resp: clear to auscultation bilaterally Cardio: S1, S2 normal GI: abdomen is soft and lax, bowel sound is ++, no mass or organomegally Extremities: extremities normal, atraumatic, no cyanosis or edema  Lab Results:  Results for orders placed or performed during the hospital encounter of 09/07/14 (from the past 48 hour(s))  CBC with Differential     Status: Abnormal   Collection Time: 09/07/14 11:23 AM  Result Value Ref Range   WBC 4.9 4.0 - 10.5 K/uL   RBC 3.84 (L) 3.87 - 5.11 MIL/uL   Hemoglobin 11.5 (L) 12.0 - 15.0 g/dL   HCT 34.8 (L) 36.0 - 46.0 %   MCV 90.6 78.0 - 100.0 fL   MCH 29.9 26.0 - 34.0 pg   MCHC 33.0 30.0 - 36.0 g/dL   RDW 13.3 11.5 - 15.5 %   Platelets 151 150 - 400 K/uL   Neutrophils Relative % 68 43 - 77 %   Neutro Abs 3.3 1.7 - 7.7 K/uL   Lymphocytes Relative 20 12 - 46 %   Lymphs Abs 1.0 0.7 - 4.0 K/uL   Monocytes Relative 11 3 - 12 %   Monocytes Absolute 0.5 0.1 - 1.0 K/uL   Eosinophils Relative 1 0 - 5 %   Eosinophils Absolute 0.1 0.0 - 0.7 K/uL   Basophils Relative 0 0 - 1 %   Basophils Absolute 0.0 0.0 - 0.1 K/uL  Comprehensive metabolic panel     Status: Abnormal   Collection Time: 09/07/14 11:23 AM  Result Value Ref Range   Sodium 139 135 - 145 mmol/L    Comment: Please note change in reference  range.   Potassium 3.5 3.5 - 5.1 mmol/L    Comment: Please note change in reference range.   Chloride 109 96 - 112 mEq/L   CO2 24 19 - 32 mmol/L   Glucose, Bld 96 70 - 99 mg/dL   BUN 34 (H) 6 - 23 mg/dL   Creatinine, Ser 1.37 (H) 0.50 - 1.10 mg/dL   Calcium 8.8 8.4 - 10.5 mg/dL   Total Protein 7.0 6.0 - 8.3 g/dL   Albumin 3.9 3.5 - 5.2 g/dL   AST 16 0 - 37 U/L   ALT 11 0 - 35 U/L   Alkaline Phosphatase 50 39 - 117 U/L   Total Bilirubin 0.7 0.3 - 1.2 mg/dL   GFR calc non Af Amer 34 (L) >90 mL/min   GFR calc Af Amer 39 (L) >90 mL/min    Comment: (NOTE) The eGFR has been calculated using the CKD EPI equation. This calculation has not been validated in all clinical situations. eGFR's persistently <90 mL/min signify possible Chronic Kidney Disease.    Anion gap 6 5 - 15  TSH     Status: Abnormal   Collection Time: 09/07/14 11:23 AM  Result Value Ref Range   TSH 0.073 (  L) 0.350 - 4.500 uIU/mL  Lipase, blood     Status: None   Collection Time: 09/07/14 11:23 AM  Result Value Ref Range   Lipase 20 11 - 59 U/L  Urinalysis, Routine w reflex microscopic     Status: None   Collection Time: 09/07/14 12:12 PM  Result Value Ref Range   Color, Urine YELLOW YELLOW   APPearance CLEAR CLEAR   Specific Gravity, Urine 1.015 1.005 - 1.030   pH 5.5 5.0 - 8.0   Glucose, UA NEGATIVE NEGATIVE mg/dL   Hgb urine dipstick NEGATIVE NEGATIVE   Bilirubin Urine NEGATIVE NEGATIVE   Ketones, ur NEGATIVE NEGATIVE mg/dL   Protein, ur NEGATIVE NEGATIVE mg/dL   Urobilinogen, UA 0.2 0.0 - 1.0 mg/dL   Nitrite NEGATIVE NEGATIVE   Leukocytes, UA NEGATIVE NEGATIVE    Comment: MICROSCOPIC NOT DONE ON URINES WITH NEGATIVE PROTEIN, BLOOD, LEUKOCYTES, NITRITE, OR GLUCOSE <1000 mg/dL.  Clostridium Difficile by PCR     Status: None   Collection Time: 09/07/14  3:41 PM  Result Value Ref Range   C difficile by pcr NEGATIVE NEGATIVE  GI pathogen panel by PCR, stool     Status: None   Collection Time: 09/07/14  3:45  PM  Result Value Ref Range   Campylobacter by PCR Negative    C difficile toxin A/B Negative    E coli 0157 by PCR Negative    E coli (ETEC) LT/ST Negative    E coli (STEC) Negative    Salmonella by PCR Negative    Shigella by PCR Negative    Norovirus G!/G2 Negative    Rotavirus A by PCR Negative    G lamblia by PCR Negative    Cryptosporidium by PCR Negative     Comment: (NOTE)  ** Normal Reference Range for each Analyte: Not Detected **       The detection and identification of specific gastrointestinal microbial nucleic acid from individuals exhibiting signs and symptoms of gastrointestinal infection aids in the diagnosis of gastrointestinal infection when used in conjunction with clinical evaluation, laboratory findings, and epidemiological information. ** The xTAG Gastrointestinal Pathogen Panel results are presumptive and must be confirmed by FDA-cleared tests or other acceptable reference methods.  The results of this test should not be used as the sole basis for diagnosis, treatment, or other patient management decisions. Performed using the Luminex xTAG Gastrointestinal Pathogen Panel test kit. Performed at [facility]   CBC     Status: Abnormal   Collection Time: 09/08/14  6:39 AM  Result Value Ref Range   WBC 4.8 4.0 - 10.5 K/uL   RBC 3.54 (L) 3.87 - 5.11 MIL/uL   Hemoglobin 10.6 (L) 12.0 - 15.0 g/dL   HCT 31.8 (L) 36.0 - 46.0 %   MCV 89.8 78.0 - 100.0 fL   MCH 29.9 26.0 - 34.0 pg   MCHC 33.3 30.0 - 36.0 g/dL   RDW 13.3 11.5 - 15.5 %   Platelets 143 (L) 150 - 400 K/uL  Basic metabolic panel     Status: Abnormal   Collection Time: 09/08/14  6:39 AM  Result Value Ref Range   Sodium 142 135 - 145 mmol/L    Comment: Please note change in reference range.   Potassium 2.6 (LL) 3.5 - 5.1 mmol/L    Comment: CRITICAL RESULT CALLED TO, READ BACK BY AND VERIFIED WITH: HEARN,J AT 7:20AM ON 09/08/14 BY FESTERMAN,C Please note change in reference range.     Chloride 114 (H) 96 -  112 mEq/L   CO2 19 19 - 32 mmol/L   Glucose, Bld 114 (H) 70 - 99 mg/dL   BUN 31 (H) 6 - 23 mg/dL   Creatinine, Ser 1.45 (H) 0.50 - 1.10 mg/dL   Calcium 8.2 (L) 8.4 - 10.5 mg/dL   GFR calc non Af Amer 31 (L) >90 mL/min   GFR calc Af Amer 36 (L) >90 mL/min    Comment: (NOTE) The eGFR has been calculated using the CKD EPI equation. This calculation has not been validated in all clinical situations. eGFR's persistently <90 mL/min signify possible Chronic Kidney Disease.    Anion gap 9 5 - 15  Basic metabolic panel     Status: Abnormal   Collection Time: 09/09/14  6:24 AM  Result Value Ref Range   Sodium 141 135 - 145 mmol/L    Comment: Please note change in reference range.   Potassium 3.5 3.5 - 5.1 mmol/L    Comment: DELTA CHECK NOTED Please note change in reference range.    Chloride 119 (H) 96 - 112 mEq/L   CO2 17 (L) 19 - 32 mmol/L   Glucose, Bld 103 (H) 70 - 99 mg/dL   BUN 24 (H) 6 - 23 mg/dL   Creatinine, Ser 1.70 (H) 0.50 - 1.10 mg/dL   Calcium 8.4 8.4 - 10.5 mg/dL   GFR calc non Af Amer 26 (L) >90 mL/min   GFR calc Af Amer 30 (L) >90 mL/min    Comment: (NOTE) The eGFR has been calculated using the CKD EPI equation. This calculation has not been validated in all clinical situations. eGFR's persistently <90 mL/min signify possible Chronic Kidney Disease.    Anion gap 5 5 - 15    ABGS No results for input(s): PHART, PO2ART, TCO2, HCO3 in the last 72 hours.  Invalid input(s): PCO2 CULTURES Recent Results (from the past 240 hour(s))  Clostridium Difficile by PCR     Status: None   Collection Time: 09/07/14  3:41 PM  Result Value Ref Range Status   C difficile by pcr NEGATIVE NEGATIVE Final   Studies/Results: Ct Abdomen Pelvis W Contrast  09/07/2014   CLINICAL DATA:  Abdominal pain and diarrhea.  Initial encounter.  EXAM: CT ABDOMEN AND PELVIS WITH CONTRAST  TECHNIQUE: Multidetector CT imaging of the abdomen and pelvis was performed using the  standard protocol following bolus administration of intravenous contrast.  CONTRAST:  67m OMNIPAQUE IOHEXOL 300 MG/ML SOLN, 858mOMNIPAQUE IOHEXOL 300 MG/ML SOLN reduced dose contrast was used secondary to renal insufficiency.  COMPARISON:  None.  FINDINGS: Musculoskeletal: No aggressive osseous lesions. L4-L5 and L5-S1 predominant lumbar spondylosis and facet arthrosis.  Lung Bases: Atelectasis.  Liver: Mild intrahepatic biliary ductal dilation is present. No mass lesion.  Spleen:  Normal.  Gallbladder: Markedly distended which may be secondary to fasting state.  Common bile duct: Within normal limits for age. No calcified stone identified.  Pancreas:  Normal.  Adrenal glands: Mild thickening of the LEFT adrenal gland without a discrete nodule compatible with hyperplasia.  Kidneys: Bilateral simple renal cysts. Normal enhancement and delayed excretion of contrast. LEFT ureter normal. RIGHT ureter normal.  Stomach:  Distended with oral contrast.  No inflammatory changes.  Small bowel: Duodenum appears normal. Jejunum is also normal. There is thickening of the terminal ileum with a small amount of fluid surrounding the terminal ileum in the anatomic pelvis. Mural thickening measures up to 8 mm. Stranding is present in the mesenteric fat.  Colon: Mural thickening of the  ascending colon is present. The hepatic flexure the colon and transverse cul are mildly dilated with contrast and stool mixed producing fluid levels. Colonic diverticulosis is present. No focal inflammatory changes to suggest acute diverticulitis. There may be some mural thickening of the sigmoid colon present as well however evaluation is difficult and the thickening may be secondary to diverticular hypertrophy.  Pelvic Genitourinary: Hysterectomy. Urinary bladder is mostly collapsed but appears within normal limits.  Peritoneum: No intra-abdominal free air. Small amount of free fluid in the anatomic pelvis, probably reactive.  Vasculature:  Atherosclerosis. Visceral atherosclerosis is also present. The visible mesenteric vessels opacified normally.  Body Wall: Fat containing periumbilical hernia.  IMPRESSION: 1. Short segment thickening of the terminal ileum and another segment of proximal colonic mural thickening. The differential considerations are ischemia, enteric infection with inflammatory bowel disease less likely. 2. Distended gallbladder and mild intrahepatic biliary ductal dilation. Correlation with bilirubin recommended. If normal, no further evaluation warranted. This recommendation follows ACR consensus guidelines: White Paper of the ACR Incidental Findings Committee II on Gallbladder and Biliary Findings. J Am Coll Radiol 2013:;10:953-956. 3. Hysterectomy. 4. Bilateral renal cysts.   Electronically Signed   By: Dereck Ligas M.D.   On: 09/07/2014 13:45    Medications: I have reviewed the patient's current medications.  Assesment:   Principal Problem:   Diarrhea Active Problems:   Nonischemic cardiomyopathy   Hypertension   Hypothyroidism   ASD (atrial septal defect)   Colitis   Abdominal pain   Nausea & vomiting   Acute renal failure Hypokalemia ?colitis   Plan:  Medications reviewed Continue IV hydration Will repeat CBC/BMP GI consult appreciated    LOS: 2 days   Christe Tellez 09/09/2014, 8:07 AM

## 2014-09-09 NOTE — Evaluation (Signed)
Physical Therapy Evaluation Patient Details Name: [name] MRN: [id number] DOB: [birth year] Today's Date: 09/09/2014   History of Present Illness  [name] is a delightful 81 y.o. female with a past medical history that includes nonischemic cardiomyopathy EF 50% 2008, hypertension, atrial septal defect, hypothyroidism, diverticulosis, presents emergency department with the chief complaint of persistent diarrhea and vomiting. Initial evaluation in the emergency department includes CT of the abdomen concerning for colitis.  C.diffi toxin is negative. CT of the abdomen is showing thickening of the terminal ileum and proximal colon.   Clinical Impression  Pt is an 81 year old female who presents to PT with dx of diarrhea.  Pt reports feeling "weak" at this time, with only moderate soreness in her stomach.  During evaluation, pt was mod (I) with bed mobility skills, min guard for transfers, and gait for 100 feet.  Pt reports she primarily uses a std cane for gait "outside the home" and is furniture walker in the home.  Initially gait assessed with std cane, though pt requested holding onto PT hand or walls and gait was transitioned to RW.  Educated pt she may need use of personal RW or rollator at return home secondary to generalized weakness for safety until strength improves.  Pt reports she lives alone, though has people that check up on her daily; pt also states her son who lives in [place] would like her to come stay with him until her strength improves.  Recommended to pt she go stay with her son for increased family support/assist as needed as pt does have generalized weakness at this time and is typically alone in her own home.  Recommend continued PT in the hospital to address strengthening for improved functional mobility skills with transition to HHPT at discharge.  No DME recommendations.     Follow Up Recommendations Home health PT;Supervision for mobility/OOB    Equipment  Recommendations  None recommended by PT       Precautions / Restrictions Precautions Precautions: Fall Restrictions Weight Bearing Restrictions: No      Mobility  Bed Mobility Overal bed mobility: Modified Independent             General bed mobility comments: Increased time to complete task.  Sit -> supine not assessed as pt placed in chair after PT evaluation  Transfers Overall transfer level: Needs assistance Equipment used: Rolling walker (2 wheeled) Transfers: Sit to/from Stand Sit to Stand: Min guard            Ambulation/Gait Ambulation/Gait assistance: Min guard Ambulation Distance (Feet): 100 Feet Assistive device: Rolling walker (2 wheeled) Gait Pattern/deviations: Step-through pattern;Decreased step length - left;Decreased stance time - right;Trunk flexed   Gait velocity interpretation: Below normal speed for age/gender       Balance Overall balance assessment: No apparent balance deficits (not formally assessed)                                           Pertinent Vitals/Pain Pain Assessment: 0-10 Pain Score: 5  Pain Location: Stomach Pain Descriptors / Indicators: Sore Pain Intervention(s): Limited activity within patient's tolerance;Monitored during session;Repositioned    Home Living Family/patient expects to be discharged to:: Private residence Living Arrangements: Alone Available Help at Discharge: Family;Friend(s);Available PRN/intermittently Type of Home: House Home Access: Stairs to enter Entrance Stairs-Rails: None Entrance Stairs-Number of Steps: 3 Home Layout: One level  Home Equipment: Walker - 2 wheels;Walker - 4 wheels;Bedside commode;Cane - single point      Prior Function Level of Independence: Independent         Comments: Pt reports mod (I) with bed mobility skills, transfers, and ambulation with use of std cane primarily.  Pt is able to complete IADLs, drive, cook, and dress self, though she does  report she has assist from friend 2x/wk for bathing.         Extremity/Trunk Assessment               Lower Extremity Assessment: Generalized weakness         Communication   Communication: No difficulties  Cognition Arousal/Alertness: Awake/alert Behavior During Therapy: WFL for tasks assessed/performed Overall Cognitive Status: Within Functional Limits for tasks assessed                      General Comments      Exercises        Assessment/Plan    PT Assessment Patient needs continued PT services  PT Diagnosis Difficulty walking;Generalized weakness   PT Problem List Decreased strength;Decreased activity tolerance;Decreased mobility  PT Treatment Interventions Gait training;Balance training;Stair training;Functional mobility training;Therapeutic activities   PT Goals (Current goals can be found in the Care Plan section) Acute Rehab PT Goals Patient Stated Goal: take care of self PT Goal Formulation: With patient Time For Goal Achievement: 09/23/14 Potential to Achieve Goals: Good    Frequency Min 3X/week   Barriers to discharge Decreased caregiver support         End of Session Equipment Utilized During Treatment: Gait belt Activity Tolerance: Patient limited by fatigue Patient left: in chair;with call bell/phone within reach;with chair alarm set Nurse Communication: Mobility status         Time: 9604-5409 PT Time Calculation (min) (ACUTE ONLY): 41 min   Charges:   PT Evaluation $Initial PT Evaluation Tier I: 1 Procedure PT Treatments $Gait Training: 8-22 mins   Kellie Shropshire, DPT [phone]

## 2014-09-09 NOTE — Progress Notes (Addendum)
Subjective:  Feels better. No further vomiting. Estimates 3-4 stools in last 24 hours, nonbloody.   Objective: Vital signs in last 24 hours: Temp:  [98.2 F (36.8 C)-99.5 F (37.5 C)] 98.2 F (36.8 C) (01/06 0525) Pulse Rate:  [70-74] 70 (01/06 0525) Resp:  [18-20] 20 (01/06 0525) BP: (115-126)/(45-59) 115/45 mmHg (01/06 0525) SpO2:  [95 %-98 %] 98 % (01/06 0525) Last BM Date: 09/08/14 General:   Alert,  Well-developed, well-nourished, pleasant and cooperative in NAD Head:  Normocephalic and atraumatic. Eyes:  Sclera clear, no icterus.  Abdomen:  Soft, mild to moderate RUQ tenderness and nondistended. Normal bowel sounds, without guarding, and without rebound.   Extremities:  Without clubbing, deformity or edema. Neurologic:  Alert and  oriented x4;  grossly normal neurologically. Skin:  Intact without significant lesions or rashes. Psych:  Alert and cooperative. Normal mood and affect.  Intake/Output from previous day: 01/05 0701 - 01/06 0700 In: 1887.5 [P.O.:360; I.V.:1127.5; IV Piggyback:400] Out: 1 [Blood:1] Intake/Output this shift:    Lab Results: CBC  Recent Labs  09/07/14 1123 09/08/14 0639  WBC 4.9 4.8  HGB 11.5* 10.6*  HCT 34.8* 31.8*  MCV 90.6 89.8  PLT 151 143*   BMET  Recent Labs  09/07/14 1123 09/08/14 0639 09/09/14 0624  NA 139 142 141  K 3.5 2.6* 3.5  CL 109 114* 119*  CO2 24 19 17*  GLUCOSE 96 114* 103*  BUN 34* 31* 24*  CREATININE 1.37* 1.45* 1.70*  CALCIUM 8.8 8.2* 8.4   LFTs  Recent Labs  09/07/14 1123  BILITOT 0.7  ALKPHOS 50  AST 16  ALT 11  PROT 7.0  ALBUMIN 3.9    Recent Labs  09/07/14 1123  LIPASE 20   PT/INR No results for input(s): LABPROT, INR in the last 72 hours.    Imaging Studies: Ct Head Wo Contrast  09/01/2014   CLINICAL DATA:  Weakness.  Syncope.  EXAM: CT HEAD WITHOUT CONTRAST  TECHNIQUE: Contiguous axial images were obtained from the base of the skull through the vertex without intravenous  contrast.  COMPARISON:  None.  FINDINGS: No intracranial hemorrhage, mass effect, or midline shift. No hydrocephalus. The basilar cisterns are patent. No evidence of territorial infarct. No intracranial fluid collection. Small lacunar infarcts left basal ganglia. Calvarium is intact. Included paranasal sinuses and mastoid air cells are well aerated.  IMPRESSION: No acute intracranial abnormality.   Electronically Signed   By: Rubye Oaks M.D.   On: 09/01/2014 17:46   Ct Abdomen Pelvis W Contrast  09/07/2014   CLINICAL DATA:  Abdominal pain and diarrhea.  Initial encounter.  EXAM: CT ABDOMEN AND PELVIS WITH CONTRAST  TECHNIQUE: Multidetector CT imaging of the abdomen and pelvis was performed using the standard protocol following bolus administration of intravenous contrast.  CONTRAST:  3mL OMNIPAQUE IOHEXOL 300 MG/ML SOLN, 78mL OMNIPAQUE IOHEXOL 300 MG/ML SOLN reduced dose contrast was used secondary to renal insufficiency.  COMPARISON:  None.  FINDINGS: Musculoskeletal: No aggressive osseous lesions. L4-L5 and L5-S1 predominant lumbar spondylosis and facet arthrosis.  Lung Bases: Atelectasis.  Liver: Mild intrahepatic biliary ductal dilation is present. No mass lesion.  Spleen:  Normal.  Gallbladder: Markedly distended which may be secondary to fasting state.  Common bile duct: Within normal limits for age. No calcified stone identified.  Pancreas:  Normal.  Adrenal glands: Mild thickening of the LEFT adrenal gland without a discrete nodule compatible with hyperplasia.  Kidneys: Bilateral simple renal cysts. Normal enhancement and delayed excretion of contrast.  LEFT ureter normal. RIGHT ureter normal.  Stomach:  Distended with oral contrast.  No inflammatory changes.  Small bowel: Duodenum appears normal. Jejunum is also normal. There is thickening of the terminal ileum with a small amount of fluid surrounding the terminal ileum in the anatomic pelvis. Mural thickening measures up to 8 mm. Stranding is  present in the mesenteric fat.  Colon: Mural thickening of the ascending colon is present. The hepatic flexure the colon and transverse cul are mildly dilated with contrast and stool mixed producing fluid levels. Colonic diverticulosis is present. No focal inflammatory changes to suggest acute diverticulitis. There may be some mural thickening of the sigmoid colon present as well however evaluation is difficult and the thickening may be secondary to diverticular hypertrophy.  Pelvic Genitourinary: Hysterectomy. Urinary bladder is mostly collapsed but appears within normal limits.  Peritoneum: No intra-abdominal free air. Small amount of free fluid in the anatomic pelvis, probably reactive.  Vasculature: Atherosclerosis. Visceral atherosclerosis is also present. The visible mesenteric vessels opacified normally.  Body Wall: Fat containing periumbilical hernia.  IMPRESSION: 1. Short segment thickening of the terminal ileum and another segment of proximal colonic mural thickening. The differential considerations are ischemia, enteric infection with inflammatory bowel disease less likely. 2. Distended gallbladder and mild intrahepatic biliary ductal dilation. Correlation with bilirubin recommended. If normal, no further evaluation warranted. This recommendation follows ACR consensus guidelines: White Paper of the ACR Incidental Findings Committee II on Gallbladder and Biliary Findings. J Am Coll Radiol 2013:;10:953-956. 3. Hysterectomy. 4. Bilateral renal cysts.   Electronically Signed   By: Andreas Newport M.D.   On: 09/07/2014 13:45  [2 weeks]   Assessment:  81 year old female with history of diverticulosis admitted with N/V/D and abdominal pain. CT showed ileitis/ascending colitis based on CT presentation. On full liquids. Less abdominal pain. Diarrhea continues. Slight decline in H/H although not grossly out of her baseline range could likely be from rehydration. Creatinine up some today. Colitis etiology  include infectious, ischemic, inflammatory. Remote colonoscopy over 20 years ago. GI pathogen panel and Cdiff PCR both negative.   Plan: 1. If clinically improved plan for outpatient follow in 6 weeks to repeat CT scan (?CTA if felt to be ischemic) and consider if colonoscopy needed at that time. If patient fails to improve, would consider colonoscopy inpatient.  2. Continue antibiotics and supportive measures.  3. Continue full liquids for now, if case we have to pursue colonoscopy.  Leanna Battles. [facility] 1/6/20169:47 AM     LOS: 2 days    Attending note:  Patient seen and examined. Appears to be improving significantly over admission. Will advance to a low-residue heart healthy diet.

## 2014-09-10 LAB — CBC
HCT: 30.6 % — ABNORMAL LOW (ref 36.0–46.0)
Hemoglobin: 10.2 g/dL — ABNORMAL LOW (ref 12.0–15.0)
MCH: 29.9 pg (ref 26.0–34.0)
MCHC: 33.3 g/dL (ref 30.0–36.0)
MCV: 89.7 fL (ref 78.0–100.0)
PLATELETS: 149 10*3/uL — AB (ref 150–400)
RBC: 3.41 MIL/uL — ABNORMAL LOW (ref 3.87–5.11)
RDW: 13.7 % (ref 11.5–15.5)
WBC: 6.5 10*3/uL (ref 4.0–10.5)

## 2014-09-10 LAB — BASIC METABOLIC PANEL
Anion gap: 3 — ABNORMAL LOW (ref 5–15)
BUN: 16 mg/dL (ref 6–23)
CO2: 16 mmol/L — ABNORMAL LOW (ref 19–32)
Calcium: 8.7 mg/dL (ref 8.4–10.5)
Chloride: 125 mEq/L — ABNORMAL HIGH (ref 96–112)
Creatinine, Ser: 1.42 mg/dL — ABNORMAL HIGH (ref 0.50–1.10)
GFR, EST AFRICAN AMERICAN: 37 mL/min — AB (ref >90)
GFR, EST NON AFRICAN AMERICAN: 32 mL/min — AB (ref >90)
GLUCOSE: 102 mg/dL — AB (ref 70–99)
POTASSIUM: 4 mmol/L (ref 3.5–5.1)
Sodium: 144 mmol/L (ref 135–145)

## 2014-09-10 MED ORDER — LORAZEPAM 2 MG/ML IJ SOLN
0.5000 mg | Freq: Once | INTRAMUSCULAR | Status: AC
  Administered 2014-09-10: 0.5 mg via INTRAVENOUS
  Filled 2014-09-10: qty 1

## 2014-09-10 MED ORDER — SODIUM BICARBONATE 8.4 % IV SOLN
INTRAVENOUS | Status: DC
  Administered 2014-09-10 – 2014-09-11 (×2): via INTRAVENOUS
  Filled 2014-09-10 (×3): qty 150

## 2014-09-10 MED ORDER — LORAZEPAM 2 MG/ML IJ SOLN
0.5000 mg | INTRAMUSCULAR | Status: DC | PRN
  Administered 2014-09-10 – 2014-09-11 (×3): 0.5 mg via INTRAVENOUS
  Filled 2014-09-10 (×3): qty 1

## 2014-09-10 NOTE — Progress Notes (Signed)
Subjective: Patient was confused and agitated during the night. She is having about 6-7 diarrhea daily. No nausea or vomiting. No fever or chills. Objective: Vital signs in last 24 hours: Temp:  [98 F (36.7 C)-98.4 F (36.9 C)] 98 F (36.7 C) (01/07 6073) Pulse Rate:  [61-80] 80 (01/07 0632) Resp:  [20] 20 (01/07 7106) BP: (102-121)/(54-82) 121/82 mmHg (01/07 0632) SpO2:  [99 %-100 %] 100 % (01/07 2694) Weight change:  Last BM Date: 09/09/14  Intake/Output from previous day: 01/06 0701 - 01/07 0700 In: 1158.8 [I.V.:658.8; IV Piggyback:500] Out: -   PHYSICAL EXAM General appearance: alert, cooperative and no distress Resp: clear to auscultation bilaterally Cardio: S1, S2 normal GI: abdomen is soft and lax, bowel sound is ++, no mass or organomegally Extremities: extremities normal, atraumatic, no cyanosis or edema  Lab Results:  Results for orders placed or performed during the hospital encounter of 09/07/14 (from the past 48 hour(s))  Basic metabolic panel     Status: Abnormal   Collection Time: 09/09/14  6:24 AM  Result Value Ref Range   Sodium 141 135 - 145 mmol/L    Comment: Please note change in reference range.   Potassium 3.5 3.5 - 5.1 mmol/L    Comment: DELTA CHECK NOTED Please note change in reference range.    Chloride 119 (H) 96 - 112 mEq/L   CO2 17 (L) 19 - 32 mmol/L   Glucose, Bld 103 (H) 70 - 99 mg/dL   BUN 24 (H) 6 - 23 mg/dL   Creatinine, Ser 1.70 (H) 0.50 - 1.10 mg/dL   Calcium 8.4 8.4 - 10.5 mg/dL   GFR calc non Af Amer 26 (L) >90 mL/min   GFR calc Af Amer 30 (L) >90 mL/min    Comment: (NOTE) The eGFR has been calculated using the CKD EPI equation. This calculation has not been validated in all clinical situations. eGFR's persistently <90 mL/min signify possible Chronic Kidney Disease.    Anion gap 5 5 - 15  CBC     Status: Abnormal   Collection Time: 09/10/14  6:24 AM  Result Value Ref Range   WBC 6.5 4.0 - 10.5 K/uL   RBC 3.41 (L) 3.87 -  5.11 MIL/uL   Hemoglobin 10.2 (L) 12.0 - 15.0 g/dL   HCT 30.6 (L) 36.0 - 46.0 %   MCV 89.7 78.0 - 100.0 fL   MCH 29.9 26.0 - 34.0 pg   MCHC 33.3 30.0 - 36.0 g/dL   RDW 13.7 11.5 - 15.5 %   Platelets 149 (L) 150 - 400 K/uL  Basic metabolic panel     Status: Abnormal   Collection Time: 09/10/14  6:24 AM  Result Value Ref Range   Sodium 144 135 - 145 mmol/L    Comment: Please note change in reference range.   Potassium 4.0 3.5 - 5.1 mmol/L    Comment: Please note change in reference range.   Chloride 125 (H) 96 - 112 mEq/L   CO2 16 (L) 19 - 32 mmol/L   Glucose, Bld 102 (H) 70 - 99 mg/dL   BUN 16 6 - 23 mg/dL   Creatinine, Ser 1.42 (H) 0.50 - 1.10 mg/dL   Calcium 8.7 8.4 - 10.5 mg/dL   GFR calc non Af Amer 32 (L) >90 mL/min   GFR calc Af Amer 37 (L) >90 mL/min    Comment: (NOTE) The eGFR has been calculated using the CKD EPI equation. This calculation has not been validated in all clinical situations.  eGFR's persistently <90 mL/min signify possible Chronic Kidney Disease.    Anion gap 3 (L) 5 - 15    ABGS No results for input(s): PHART, PO2ART, TCO2, HCO3 in the last 72 hours.  Invalid input(s): PCO2 CULTURES Recent Results (from the past 240 hour(s))  Clostridium Difficile by PCR     Status: None   Collection Time: 09/07/14  3:41 PM  Result Value Ref Range Status   C difficile by pcr NEGATIVE NEGATIVE Final   Studies/Results: No results found.  Medications: I have reviewed the patient's current medications.  Assesment:   Principal Problem:   Diarrhea Active Problems:   Nonischemic cardiomyopathy   Hypertension   Hypothyroidism   ASD (atrial septal defect)   Colitis   Abdominal pain   Nausea & vomiting   Acute renal failure Hypokalemia ?colitis   Plan:  Medications reviewed Will lchange fluid to sodium bicarb drip Will repeat CBC/BMP Supportive care.    LOS: 3 days   [name] 09/10/2014, 8:23 AM

## 2014-09-10 NOTE — Progress Notes (Signed)
Physical Therapy Session Note  Patient Details  Name: [name] MRN: [id number] Date of Birth: 1925-05-10  Today's Date: 09/10/2014 PT Individual Time:  - 1645-1730     Skilled Therapeutic Interventions/Progress Updates:    Pt appeared confused and agitated during treatment session; evaluating PT consulted and states there was a change in mental status from yesterday IE.  Spoke with NT who states pt has had some confusion today, and per daughter states she was called last night/early this morning secondary to change in mental status.  Conferred with daughter who states pt will return home with her or her brother for 24/7 assist.   Per evaluating therapist, Kellie Shropshire, DPT, d/c recommendation updated for 24/7 assist and HHPT.  Min to mod assistance required with bed mobility today for supine to sit and cueing for hand placement with sit to stand.  Gait complete with RW with cueing for proper gait mechanics including posture and to stand within walker for safety, pt able to increase gait distance indicating improve activity tolerance.  End of session pt left in chair with chair alarm set, call bell within reach, RN and daughter in room.  No reports of pain during session.    Therapy Documentation Precautions:  Precautions Precautions: Fall Restrictions Weight Bearing Restrictions: No Mobility:  min to mod with supine to sit Min assist with sit to stand from chair and commade with cueing for hand placement Locomotion : Ambulation Ambulation Distance (Feet): 250 Feet  Min guard with RW Exercises: General Exercises - Lower Extremity Ankle Circles/Pumps: AAROM;Both;10 reps Heel Slides: Both;10 reps;AAROM Hip ABduction/ADduction: AAROM;Both;10 reps  See FIM for current functional status  Therapy/Group: Individual Therapy  [name] 09/10/2014, 5:54 PM

## 2014-09-10 NOTE — Progress Notes (Signed)
Subjective:  Patient very agitated and confused overnight. Daughter came in at 3am to sit with her. At least 7 liquid, nonbloody stools overnight.   Objective: Vital signs in last 24 hours: Temp:  [98 F (36.7 C)-98.4 F (36.9 C)] 98 F (36.7 C) (01/07 8144) Pulse Rate:  [61-80] 80 (01/07 0632) Resp:  [20] 20 (01/07 8185) BP: (102-121)/(54-82) 121/82 mmHg (01/07 6314) SpO2:  [99 %-100 %] 100 % (01/07 9702) Last BM Date: 09/09/14 General:   Alert,  Well-developed, well-nourished, pleasant and cooperative in NAD Head:  Normocephalic and atraumatic. Eyes:  Sclera clear, no icterus.  Abdomen:  Soft, nontender and nondistended. Normal bowel sounds, without guarding, and without rebound.   Extremities:  Without clubbing, deformity or edema. Neurologic:  Alert and  oriented x4;  grossly normal neurologically. Skin:  Intact without significant lesions or rashes. Psych:  Alert and cooperative, confused. Normal mood and affect.  Intake/Output from previous day: 01/06 0701 - 01/07 0700 In: 1158.8 [I.V.:658.8; IV Piggyback:500] Out: -  Intake/Output this shift:    Lab Results: CBC  Recent Labs  09/07/14 1123 09/08/14 0639 09/10/14 0624  WBC 4.9 4.8 6.5  HGB 11.5* 10.6* 10.2*  HCT 34.8* 31.8* 30.6*  MCV 90.6 89.8 89.7  PLT 151 143* 149*   BMET  Recent Labs  09/08/14 0639 09/09/14 0624 09/10/14 0624  NA 142 141 144  K 2.6* 3.5 4.0  CL 114* 119* 125*  CO2 19 17* 16*  GLUCOSE 114* 103* 102*  BUN 31* 24* 16  CREATININE 1.45* 1.70* 1.42*  CALCIUM 8.2* 8.4 8.7   LFTs  Recent Labs  09/07/14 1123  BILITOT 0.7  ALKPHOS 50  AST 16  ALT 11  PROT 7.0  ALBUMIN 3.9    Recent Labs  09/07/14 1123  LIPASE 20   PT/INR No results for input(s): LABPROT, INR in the last 72 hours.    Imaging Studies: Ct Head Wo Contrast  09/01/2014   CLINICAL DATA:  Weakness.  Syncope.  EXAM: CT HEAD WITHOUT CONTRAST  TECHNIQUE: Contiguous axial images were obtained from the base of  the skull through the vertex without intravenous contrast.  COMPARISON:  None.  FINDINGS: No intracranial hemorrhage, mass effect, or midline shift. No hydrocephalus. The basilar cisterns are patent. No evidence of territorial infarct. No intracranial fluid collection. Small lacunar infarcts left basal ganglia. Calvarium is intact. Included paranasal sinuses and mastoid air cells are well aerated.  IMPRESSION: No acute intracranial abnormality.   Electronically Signed   By: Rubye Oaks M.D.   On: 09/01/2014 17:46   Ct Abdomen Pelvis W Contrast  09/07/2014   CLINICAL DATA:  Abdominal pain and diarrhea.  Initial encounter.  EXAM: CT ABDOMEN AND PELVIS WITH CONTRAST  TECHNIQUE: Multidetector CT imaging of the abdomen and pelvis was performed using the standard protocol following bolus administration of intravenous contrast.  CONTRAST:  57mL OMNIPAQUE IOHEXOL 300 MG/ML SOLN, 38mL OMNIPAQUE IOHEXOL 300 MG/ML SOLN reduced dose contrast was used secondary to renal insufficiency.  COMPARISON:  None.  FINDINGS: Musculoskeletal: No aggressive osseous lesions. L4-L5 and L5-S1 predominant lumbar spondylosis and facet arthrosis.  Lung Bases: Atelectasis.  Liver: Mild intrahepatic biliary ductal dilation is present. No mass lesion.  Spleen:  Normal.  Gallbladder: Markedly distended which may be secondary to fasting state.  Common bile duct: Within normal limits for age. No calcified stone identified.  Pancreas:  Normal.  Adrenal glands: Mild thickening of the LEFT adrenal gland without a discrete nodule compatible with hyperplasia.  Kidneys: Bilateral simple renal cysts. Normal enhancement and delayed excretion of contrast. LEFT ureter normal. RIGHT ureter normal.  Stomach:  Distended with oral contrast.  No inflammatory changes.  Small bowel: Duodenum appears normal. Jejunum is also normal. There is thickening of the terminal ileum with a small amount of fluid surrounding the terminal ileum in the anatomic pelvis. Mural  thickening measures up to 8 mm. Stranding is present in the mesenteric fat.  Colon: Mural thickening of the ascending colon is present. The hepatic flexure the colon and transverse cul are mildly dilated with contrast and stool mixed producing fluid levels. Colonic diverticulosis is present. No focal inflammatory changes to suggest acute diverticulitis. There may be some mural thickening of the sigmoid colon present as well however evaluation is difficult and the thickening may be secondary to diverticular hypertrophy.  Pelvic Genitourinary: Hysterectomy. Urinary bladder is mostly collapsed but appears within normal limits.  Peritoneum: No intra-abdominal free air. Small amount of free fluid in the anatomic pelvis, probably reactive.  Vasculature: Atherosclerosis. Visceral atherosclerosis is also present. The visible mesenteric vessels opacified normally.  Body Wall: Fat containing periumbilical hernia.  IMPRESSION: 1. Short segment thickening of the terminal ileum and another segment of proximal colonic mural thickening. The differential considerations are ischemia, enteric infection with inflammatory bowel disease less likely. 2. Distended gallbladder and mild intrahepatic biliary ductal dilation. Correlation with bilirubin recommended. If normal, no further evaluation warranted. This recommendation follows ACR consensus guidelines: White Paper of the ACR Incidental Findings Committee II on Gallbladder and Biliary Findings. J Am Coll Radiol 2013:;10:953-956. 3. Hysterectomy. 4. Bilateral renal cysts.   Electronically Signed   By: Andreas Newport M.D.   On: 09/07/2014 13:45  [2 weeks]   Assessment:  81 year old female with history of diverticulosis admitted with N/V/D and abdominal pain. CT showed ileitis/ascending colitis based on CT presentation. On full liquids. Less abdominal pain. Diarrhea continues. Slight decline in H/H but has been stable. Creatinine slightly improved. Colitis etiology include  infectious, ischemic, inflammatory. Remote colonoscopy over 20 years ago. GI pathogen panel and Cdiff PCR both negative. Discussed with daughter at length. She has improvement in abdominal pain/exam but ongoing diarrhea. To discuss plan with Dr. Jena Gauss.  Plan: 1. To discuss further management with Dr. Jena Gauss, ?consider CTA abd vs colonoscopy.  Leanna Battles. [facility] 1/7/201610:21 AM     LOS: 3 days    Attending note:  Patient resting comfortably this afternoon. Accompanied by her daughter, Suzette Battiest. Only 3 loose nonbloody BMs today. Patient has not complained of any abdominal pain today. No nausea or vomiting. Patient ate most of a regular lunch tray with family encouragement without any apparent difficulty.    Her GI symptoms have improved over the past 24 hours. I personally reviewed the CT images with Dr. Tyron Russell. Although she has some thickening in the area of her ileocecal bowel. There is a long segment of sigmoid colon which is nonspecifically abnormal. More likely inflammatory with mesenteric stranding although this segment could easily be harboring other significant pathology including an occult neoplasm. She has relative minimal plaquing in her mesenteric vasculature which makes mesenteric ischemia somewhat less likely.  I told her daughter that she ought to have a colonoscopy at some point within the next several weeks. However, I do not feel that she necessarily needs a colonoscopy right now particularly in view of her apparent improvement. We will continue to reassess daily. Recommendation subject to change depending on clinical course. Marland Kitchen

## 2014-09-10 NOTE — Progress Notes (Signed)
Pt had 7 watery green stoolsl in bed and in BSC.  She has been very agitated and would not stay in bed.  She talked about going to the Dr and going to the hospital.  Dr Juanetta Gosling was informed and he prescribed Ativan IV  .05  Q2hr PRN.  Her daughter [place] came in to sit with her about 0300.  She is still wide awake but her daughter has her in control and she is staying in bed.

## 2014-09-11 LAB — BASIC METABOLIC PANEL
ANION GAP: 4 — AB (ref 5–15)
BUN: 12 mg/dL (ref 6–23)
CHLORIDE: 118 meq/L — AB (ref 96–112)
CO2: 21 mmol/L (ref 19–32)
Calcium: 8.1 mg/dL — ABNORMAL LOW (ref 8.4–10.5)
Creatinine, Ser: 1.23 mg/dL — ABNORMAL HIGH (ref 0.50–1.10)
GFR calc Af Amer: 44 mL/min — ABNORMAL LOW (ref >90)
GFR calc non Af Amer: 38 mL/min — ABNORMAL LOW (ref >90)
GLUCOSE: 112 mg/dL — AB (ref 70–99)
Potassium: 3.4 mmol/L — ABNORMAL LOW (ref 3.5–5.1)
Sodium: 143 mmol/L (ref 135–145)

## 2014-09-11 LAB — CBC
HCT: 27.6 % — ABNORMAL LOW (ref 36.0–46.0)
HEMOGLOBIN: 9.2 g/dL — AB (ref 12.0–15.0)
MCH: 29.5 pg (ref 26.0–34.0)
MCHC: 33.3 g/dL (ref 30.0–36.0)
MCV: 88.5 fL (ref 78.0–100.0)
Platelets: 141 10*3/uL — ABNORMAL LOW (ref 150–400)
RBC: 3.12 MIL/uL — AB (ref 3.87–5.11)
RDW: 13.7 % (ref 11.5–15.5)
WBC: 5.6 10*3/uL (ref 4.0–10.5)

## 2014-09-11 MED ORDER — CIPROFLOXACIN IN D5W 400 MG/200ML IV SOLN
400.0000 mg | Freq: Two times a day (BID) | INTRAVENOUS | Status: DC
  Administered 2014-09-11 – 2014-09-12 (×2): 400 mg via INTRAVENOUS
  Filled 2014-09-11 (×2): qty 200

## 2014-09-11 MED ORDER — POTASSIUM CHLORIDE CRYS ER 20 MEQ PO TBCR
40.0000 meq | EXTENDED_RELEASE_TABLET | Freq: Once | ORAL | Status: AC
  Administered 2014-09-11: 40 meq via ORAL
  Filled 2014-09-11: qty 2

## 2014-09-11 MED ORDER — SODIUM BICARBONATE 8.4 % IV SOLN
INTRAVENOUS | Status: DC
  Administered 2014-09-11: 10:00:00 via INTRAVENOUS
  Filled 2014-09-11 (×4): qty 50

## 2014-09-11 NOTE — Progress Notes (Signed)
ANTIBIOTIC CONSULT NOTE  Pharmacy Consult for Cipro Indication: Intra abdominal infection  Allergies  Allergen Reactions  . Aspirin Other (See Comments)    unknown    Patient Measurements: Height: 5\' 4"  (162.6 cm) Weight: 151 lb (68.493 kg) IBW/kg (Calculated) : 54.7  Vital Signs: Temp: 98 F (36.7 C) (01/08 0712) Temp Source: Oral (01/08 0712) BP: 150/52 mmHg (01/08 0712) Pulse Rate: 75 (01/08 0712) Intake/Output from previous day: 01/07 0701 - 01/08 0700 In: 360 [P.O.:360] Out: 1100 [Urine:1100] Intake/Output from this shift: Total I/O In: -  Out: 200 [Urine:200]  Labs:  Recent Labs  09/09/14 0624 09/10/14 0624 09/11/14 0705  WBC  --  6.5 5.6  HGB  --  10.2* 9.2*  PLT  --  149* 141*  CREATININE 1.70* 1.42* 1.23*   Estimated Creatinine Clearance: 30.6 mL/min (by C-G formula based on Cr of 1.23). No results for input(s): VANCOTROUGH, VANCOPEAK, VANCORANDOM, GENTTROUGH, GENTPEAK, GENTRANDOM, TOBRATROUGH, TOBRAPEAK, TOBRARND, AMIKACINPEAK, AMIKACINTROU, AMIKACIN in the last 72 hours.   Microbiology: Recent Results (from the past 720 hour(s))  Clostridium Difficile by PCR     Status: None   Collection Time: 09/07/14  3:41 PM  Result Value Ref Range Status   C difficile by pcr NEGATIVE NEGATIVE Final    Medical History: Past Medical History  Diagnosis Date  . Cardiomyopathy     Nonischemic, EF 25-30% 2007 / EF improved 50% 2008, ( catheterization not done) nuclear 2007, fixed anteroapical defect, no ischemia  . Ejection fraction     EF 50%, echo, 2008  . Hypertension   . Pulmonary hypertension   . ASD (atrial septal defect)     Small  . Edema     Chronic lower extremity edema... venous insufficiency  . Hypothyroidism   . Stroke   . Diverticulitis   . Gout     Medications:  Prescriptions prior to admission  Medication Sig Dispense Refill Last Dose  . ALPRAZolam (XANAX) 0.5 MG tablet Take 0.5 mg by mouth 2 (two) times daily.   09/06/2014 at Unknown  time  . carvedilol (COREG) 25 MG tablet Take 25 mg by mouth 2 (two) times daily with a meal.    09/06/2014 at 800  . furosemide (LASIX) 40 MG tablet Take 40 mg by mouth 2 (two) times daily.     09/06/2014 at Unknown time  . hydrALAZINE (APRESOLINE) 50 MG tablet Take 50 mg by mouth 2 (two) times daily.   09/06/2014 at Unknown time  . levothyroxine (SYNTHROID, LEVOTHROID) 75 MCG tablet Take 75 mcg by mouth daily.     09/06/2014 at Unknown time  . lisinopril (PRINIVIL,ZESTRIL) 20 MG tablet Take 20 mg by mouth daily.     09/06/2014 at Unknown time  . vitamin B-12 (CYANOCOBALAMIN) 1000 MCG tablet Take 1,000 mcg by mouth daily.   09/06/2014 at Unknown time  . acetaminophen (TYLENOL) 500 MG tablet Take 500 mg by mouth every 6 (six) hours as needed for mild pain or moderate pain (for leg pain).   unknown  . acetaminophen-codeine (TYLENOL #3) 300-30 MG per tablet Take 1 tablet by mouth 2 (two) times daily as needed for moderate pain.   09/04/2014  . diphenhydrAMINE (BENADRYL) 25 mg capsule Take 25 mg by mouth every 6 (six) hours as needed for itching or allergies.   unknown  . meclizine (ANTIVERT) 25 MG tablet Take 25 mg by mouth 3 (three) times daily as needed for dizziness.    unknown  . tobramycin (TOBREX) 0.3 % ophthalmic  solution Place 2 drops into the left eye every 4 (four) hours. (Patient not taking: Reported on 09/01/2014) 5 mL 0    Scheduled:  . ALPRAZolam  0.5 mg Oral BID  . carvedilol  25 mg Oral BID WC  . ciprofloxacin  400 mg Intravenous Q24H  . enoxaparin (LOVENOX) injection  30 mg Subcutaneous Q24H  . feeding supplement (ENSURE COMPLETE)  237 mL Oral TID BM  . levothyroxine  75 mcg Oral QAC breakfast  . metronidazole  500 mg Intravenous Q8H  . ondansetron (ZOFRAN) IV  4 mg Intravenous TID AC  . pantoprazole  40 mg Oral BID  . tobramycin  2 drop Left Eye 6 times per day  . vitamin B-12  1,000 mcg Oral Daily   Assessment:  47 yoF admitted with persistent diarrhea & vomiting.  Abdominal CT  concerning for colitis & she was started on empiric Cipro & Flagyl.  Noted hx of diverticulosis. She is afebrile with normal WBC.  Clinically improving per MD notes.  Scr was elevated on admission, but is trending down.  Estimated CrCl ~ 30-82ml/min.   Cipro 1/4>> Flagyl 1/4>>  Goal of Therapy:  Eradicate infection  Plan:  Increase Cipro 400 mg IV every 12 hours Monitor renal function Duration of therapy per MD Change to PO once clinically appropriate  [name], [facility] 09/11/2014,1:29 PM

## 2014-09-11 NOTE — Progress Notes (Signed)
    Subjective: Appears to be clinically improving. Confusion improved when I saw her this morning, able to correctly identify the current president, answers questions appropriately. Denies any abdominal pain, N/V. Family confirms no patient overt complaints of such. Seems to be tolerating diet, ate 100% lunch yesterday, 50% of dinner with no symptoms. At minimal breakfast this morning but is a bit sleepy after receiving Ativan last night for agitation. Family states she had one non-bloody bowel movement last night and another this morning with today's bowel movement "seeming to be firming up."   Objective: Vital signs in last 24 hours: Temp:  [98 F (36.7 C)-98.2 F (36.8 C)] 98 F (36.7 C) (01/08 0712) Pulse Rate:  [63-75] 75 (01/08 0712) Resp:  [20] 20 (01/08 0712) BP: (124-150)/(46-76) 150/52 mmHg (01/08 0712) SpO2:  [99 %-100 %] 100 % (01/08 0712) Last BM Date: 09/11/14 General:   Sleeping but easily aroused to voice and touch, pleasant Head:  Normocephalic and atraumatic. Eyes:  No icterus, sclera clear. Conjuctiva pink.  Heart:  S1, S2 present, no murmurs noted. Lungs: Clear to auscultation bilaterally, without wheezing, rales, or rhonchi.  Abdomen:  See HPI. Bowel sounds present, rounded, soft, non-distended. Mild TTP lower abdomen, non-tender upper abdomen and epigastric. No HSM or hernias noted. No rebound or guarding. No masses appreciated  Msk:  Symmetrical without gross deformities. Normal posture. Pulses:  Normal DP pulses noted. Extremities:  Without clubbing. Mild 1+ edema bilaterally noted. Neurologic:  Alert and grossly normal neurologically. Skin:  Warm and dry, intact without significant lesions.  Psych:  Sleepy but alert and cooperative. Normal mood and affect.  Intake/Output from previous day: 01/07 0701 - 01/08 0700 In: 360 [P.O.:360] Out: 1100 [Urine:1100] Intake/Output this shift: Total I/O In: -  Out: 200 [Urine:200]  Lab Results:  Recent Labs  09/10/14 0624 09/11/14 0705  WBC 6.5 5.6  HGB 10.2* 9.2*  HCT 30.6* 27.6*  PLT 149* 141*   BMET  Recent Labs  09/09/14 0624 09/10/14 0624 09/11/14 0705  NA 141 144 143  K 3.5 4.0 3.4*  CL 119* 125* 118*  CO2 17* 16* 21  GLUCOSE 103* 102* 112*  BUN 24* 16 12  CREATININE 1.70* 1.42* 1.23*  CALCIUM 8.4 8.7 8.1*    Studies/Results: No results found.  Assessment: Appears to be clinically improving from a GI standpoint. Fewer bowel movements with possible solidifying of her BM. Tolerating diet thus far, currently on a cardiac diet. Contimed mild TTP lower abdomen but no resting pain. No overt signs of GI bleeding, H/H essentially stable. Etiology of acute episode based on CT discussion with radiology inflammatory with mesenteric stranding in the sigmoid colon vs. Other significant pathology including occult neoplasm. Mesenteric ischemia less likely.  Plan: 1. Continue to monitor H/H for any changes 2. Maintain diet and monitor for tolerance 3. Follow-up as outpatient after discharge 4. Plan for outpatient colonoscopy a few weeks after discharge for further assessment but no need for urgent IP TCS barring any acute change.   Wynne Dust, AGNP-C Adult & Gerontological Nurse Practitioner [facility]     LOS: 4 days    09/11/2014, 12:11 PM

## 2014-09-11 NOTE — Progress Notes (Signed)
Subjective: Patient is resting. She remained confused and disoriented. She had three episode of diarrhea yesterday and once today. No nausea or vomiting. Objective: Vital signs in last 24 hours: Temp:  [98 F (36.7 C)-98.2 F (36.8 C)] 98 F (36.7 C) (01/08 4818) Pulse Rate:  [63-75] 75 (01/08 0712) Resp:  [20] 20 (01/08 0712) BP: (124-150)/(46-76) 150/52 mmHg (01/08 0712) SpO2:  [99 %-100 %] 100 % (01/08 0712) Weight change:  Last BM Date: 09/10/14  Intake/Output from previous day: 01/07 0701 - 01/08 0700 In: 360 [P.O.:360] Out: 1100 [Urine:1100]  PHYSICAL EXAM General appearance: alert, cooperative and no distress Resp: clear to auscultation bilaterally Cardio: S1, S2 normal GI: abdomen is soft and lax, bowel sound is ++, no mass or organomegally Extremities: extremities normal, atraumatic, no cyanosis or edema  Lab Results:  Results for orders placed or performed during the hospital encounter of 09/07/14 (from the past 48 hour(s))  CBC     Status: Abnormal   Collection Time: 09/10/14  6:24 AM  Result Value Ref Range   WBC 6.5 4.0 - 10.5 K/uL   RBC 3.41 (L) 3.87 - 5.11 MIL/uL   Hemoglobin 10.2 (L) 12.0 - 15.0 g/dL   HCT 30.6 (L) 36.0 - 46.0 %   MCV 89.7 78.0 - 100.0 fL   MCH 29.9 26.0 - 34.0 pg   MCHC 33.3 30.0 - 36.0 g/dL   RDW 13.7 11.5 - 15.5 %   Platelets 149 (L) 150 - 400 K/uL  Basic metabolic panel     Status: Abnormal   Collection Time: 09/10/14  6:24 AM  Result Value Ref Range   Sodium 144 135 - 145 mmol/L    Comment: Please note change in reference range.   Potassium 4.0 3.5 - 5.1 mmol/L    Comment: Please note change in reference range.   Chloride 125 (H) 96 - 112 mEq/L   CO2 16 (L) 19 - 32 mmol/L   Glucose, Bld 102 (H) 70 - 99 mg/dL   BUN 16 6 - 23 mg/dL   Creatinine, Ser 1.42 (H) 0.50 - 1.10 mg/dL   Calcium 8.7 8.4 - 10.5 mg/dL   GFR calc non Af Amer 32 (L) >90 mL/min   GFR calc Af Amer 37 (L) >90 mL/min    Comment: (NOTE) The eGFR has been  calculated using the CKD EPI equation. This calculation has not been validated in all clinical situations. eGFR's persistently <90 mL/min signify possible Chronic Kidney Disease.    Anion gap 3 (L) 5 - 15  CBC     Status: Abnormal   Collection Time: 09/11/14  7:05 AM  Result Value Ref Range   WBC 5.6 4.0 - 10.5 K/uL   RBC 3.12 (L) 3.87 - 5.11 MIL/uL   Hemoglobin 9.2 (L) 12.0 - 15.0 g/dL   HCT 27.6 (L) 36.0 - 46.0 %   MCV 88.5 78.0 - 100.0 fL   MCH 29.5 26.0 - 34.0 pg   MCHC 33.3 30.0 - 36.0 g/dL   RDW 13.7 11.5 - 15.5 %   Platelets 141 (L) 150 - 400 K/uL  Basic metabolic panel     Status: Abnormal   Collection Time: 09/11/14  7:05 AM  Result Value Ref Range   Sodium 143 135 - 145 mmol/L    Comment: Please note change in reference range.   Potassium 3.4 (L) 3.5 - 5.1 mmol/L    Comment: Please note change in reference range.   Chloride 118 (H) 96 - 112 mEq/L  CO2 21 19 - 32 mmol/L   Glucose, Bld 112 (H) 70 - 99 mg/dL   BUN 12 6 - 23 mg/dL   Creatinine, Ser 1.23 (H) 0.50 - 1.10 mg/dL   Calcium 8.1 (L) 8.4 - 10.5 mg/dL   GFR calc non Af Amer 38 (L) >90 mL/min   GFR calc Af Amer 44 (L) >90 mL/min    Comment: (NOTE) The eGFR has been calculated using the CKD EPI equation. This calculation has not been validated in all clinical situations. eGFR's persistently <90 mL/min signify possible Chronic Kidney Disease.    Anion gap 4 (L) 5 - 15    ABGS No results for input(s): PHART, PO2ART, TCO2, HCO3 in the last 72 hours.  Invalid input(s): PCO2 CULTURES Recent Results (from the past 240 hour(s))  Clostridium Difficile by PCR     Status: None   Collection Time: 09/07/14  3:41 PM  Result Value Ref Range Status   C difficile by pcr NEGATIVE NEGATIVE Final   Studies/Results: No results found.  Medications: I have reviewed the patient's current medications.  Assesment:   Principal Problem:   Diarrhea Active Problems:   Nonischemic cardiomyopathy   Hypertension    Hypothyroidism   ASD (atrial septal defect)   Colitis   Abdominal pain   Nausea & vomiting   Acute renal failure Hypokalemia ?colitis   Plan:  Medications reviewed Will adjust her Iv fluid Will repeat CBC/BMP Physical therapy    LOS: 4 days   Alex Mcmanigal 09/11/2014, 8:07 AM

## 2014-09-12 DIAGNOSIS — R1032 Left lower quadrant pain: Secondary | ICD-10-CM

## 2014-09-12 LAB — BASIC METABOLIC PANEL
ANION GAP: 4 — AB (ref 5–15)
BUN: 11 mg/dL (ref 6–23)
CO2: 20 mmol/L (ref 19–32)
Calcium: 8.1 mg/dL — ABNORMAL LOW (ref 8.4–10.5)
Chloride: 122 mEq/L — ABNORMAL HIGH (ref 96–112)
Creatinine, Ser: 1.22 mg/dL — ABNORMAL HIGH (ref 0.50–1.10)
GFR, EST AFRICAN AMERICAN: 45 mL/min — AB (ref >90)
GFR, EST NON AFRICAN AMERICAN: 39 mL/min — AB (ref >90)
GLUCOSE: 105 mg/dL — AB (ref 70–99)
Potassium: 4.1 mmol/L (ref 3.5–5.1)
Sodium: 146 mmol/L — ABNORMAL HIGH (ref 135–145)

## 2014-09-12 LAB — CBC
HCT: 28.9 % — ABNORMAL LOW (ref 36.0–46.0)
HEMOGLOBIN: 9.8 g/dL — AB (ref 12.0–15.0)
MCH: 29.8 pg (ref 26.0–34.0)
MCHC: 33.9 g/dL (ref 30.0–36.0)
MCV: 87.8 fL (ref 78.0–100.0)
PLATELETS: 139 10*3/uL — AB (ref 150–400)
RBC: 3.29 MIL/uL — AB (ref 3.87–5.11)
RDW: 13.3 % (ref 11.5–15.5)
WBC: 6.4 10*3/uL (ref 4.0–10.5)

## 2014-09-12 MED ORDER — PANTOPRAZOLE SODIUM 40 MG PO TBEC: 40.0000 mg | DELAYED_RELEASE_TABLET | Freq: Two times a day (BID) | ORAL | Status: AC

## 2014-09-12 MED ORDER — CIPROFLOXACIN HCL 500 MG PO TABS: 500.0000 mg | ORAL_TABLET | Freq: Two times a day (BID) | ORAL | Status: DC

## 2014-09-12 MED ORDER — METRONIDAZOLE 500 MG PO TABS: 500.0000 mg | ORAL_TABLET | Freq: Three times a day (TID) | ORAL | Status: DC

## 2014-09-12 NOTE — Progress Notes (Signed)
Called and faxed information to [facility].

## 2014-09-12 NOTE — Progress Notes (Addendum)
Patient ID: [name], female   DOB: 10/03/1924, 81 y.o.   MRN: [id number]   Assessment/Plan: ADMITTED WITH ILEITIS/COLITIS-CLINICALLY IMPROVED. TOLERATING POs  PLAN: 1. COMPLETE 10 DAYS ABX 2. OPV IN 6 WKS W/ DR. Kalani Sthilaire   TO DISCUSS REPEAT CT +/- TCS DISCUSSED WITH PT AND DAUGHTER.  Subjective: Since I last evaluated the patient SHE IS TOLERATING POs. NO NAUSEA OR VOMITING OR ABDOMINAL PAIN.  Objective: Vital signs in last 24 hours: Filed Vitals:   09/12/14 0356  BP: 157/63  Pulse: 70  Temp: 100.5 F (38.1 C)  Resp: 16    General appearance: alert, cooperative and no distress Resp: clear to auscultation bilaterally Cardio: regular rate and rhythm GI: soft, non-tender; bowel sounds normal; MILD DISTENSTION  Lab Results:   Studies/Results: No results found.  Medications: I have reviewed the patient's current medications.   LOS: 5 days   Jonette Eva 02/12/2014, 2:23 PM

## 2014-09-12 NOTE — Discharge Summary (Signed)
Physician Discharge Summary  Patient ID: [name] MRN: [id number] DOB/AGE: 04-22-1925 81 y.o. Primary Care Physician:Aziel Morgan, MD Admit date: 09/07/2014 Discharge date: 09/12/2014    Discharge Diagnoses:   Principal Problem:   Diarrhea Active Problems:   Nonischemic cardiomyopathy   Hypertension   Hypothyroidism   ASD (atrial septal defect)   Colitis   Abdominal pain   Nausea & vomiting   Acute renal failure     Medication List    STOP taking these medications        acetaminophen 500 MG tablet  Commonly known as:  TYLENOL     diphenhydrAMINE 25 mg capsule  Commonly known as:  BENADRYL     furosemide 40 MG tablet  Commonly known as:  LASIX      TAKE these medications        acetaminophen-codeine 300-30 MG per tablet  Commonly known as:  TYLENOL #3  Take 1 tablet by mouth 2 (two) times daily as needed for moderate pain.     ALPRAZolam 0.5 MG tablet  Commonly known as:  XANAX  Take 0.5 mg by mouth 2 (two) times daily.     carvedilol 25 MG tablet  Commonly known as:  COREG  Take 25 mg by mouth 2 (two) times daily with a meal.     ciprofloxacin 500 MG tablet  Commonly known as:  CIPRO  Take 1 tablet (500 mg total) by mouth 2 (two) times daily.     hydrALAZINE 50 MG tablet  Commonly known as:  APRESOLINE  Take 50 mg by mouth 2 (two) times daily.     levothyroxine 75 MCG tablet  Commonly known as:  SYNTHROID, LEVOTHROID  Take 75 mcg by mouth daily.     lisinopril 20 MG tablet  Commonly known as:  PRINIVIL,ZESTRIL  Take 20 mg by mouth daily.     meclizine 25 MG tablet  Commonly known as:  ANTIVERT  Take 25 mg by mouth 3 (three) times daily as needed for dizziness.     metroNIDAZOLE 500 MG tablet  Commonly known as:  FLAGYL  Take 1 tablet (500 mg total) by mouth 3 (three) times daily.     pantoprazole 40 MG tablet  Commonly known as:  PROTONIX  Take 1 tablet (40 mg total) by mouth 2 (two) times daily.     tobramycin 0.3 % ophthalmic  solution  Commonly known as:  TOBREX  Place 2 drops into the left eye every 4 (four) hours.     vitamin B-12 1000 MCG tablet  Commonly known as:  CYANOCOBALAMIN  Take 1,000 mcg by mouth daily.        Discharged Condition: improved    Consults: GI  Significant Diagnostic Studies: Ct Head Wo Contrast  09/01/2014   CLINICAL DATA:  Weakness.  Syncope.  EXAM: CT HEAD WITHOUT CONTRAST  TECHNIQUE: Contiguous axial images were obtained from the base of the skull through the vertex without intravenous contrast.  COMPARISON:  None.  FINDINGS: No intracranial hemorrhage, mass effect, or midline shift. No hydrocephalus. The basilar cisterns are patent. No evidence of territorial infarct. No intracranial fluid collection. Small lacunar infarcts left basal ganglia. Calvarium is intact. Included paranasal sinuses and mastoid air cells are well aerated.  IMPRESSION: No acute intracranial abnormality.   Electronically Signed   By: Rubye Oaks M.D.   On: 09/01/2014 17:46   Ct Abdomen Pelvis W Contrast  09/07/2014   CLINICAL DATA:  Abdominal pain and diarrhea.  Initial encounter.  EXAM: CT ABDOMEN AND PELVIS WITH CONTRAST  TECHNIQUE: Multidetector CT imaging of the abdomen and pelvis was performed using the standard protocol following bolus administration of intravenous contrast.  CONTRAST:  50mL OMNIPAQUE IOHEXOL 300 MG/ML SOLN, 80mL OMNIPAQUE IOHEXOL 300 MG/ML SOLN reduced dose contrast was used secondary to renal insufficiency.  COMPARISON:  None.  FINDINGS: Musculoskeletal: No aggressive osseous lesions. L4-L5 and L5-S1 predominant lumbar spondylosis and facet arthrosis.  Lung Bases: Atelectasis.  Liver: Mild intrahepatic biliary ductal dilation is present. No mass lesion.  Spleen:  Normal.  Gallbladder: Markedly distended which may be secondary to fasting state.  Common bile duct: Within normal limits for age. No calcified stone identified.  Pancreas:  Normal.  Adrenal glands: Mild thickening of the  LEFT adrenal gland without a discrete nodule compatible with hyperplasia.  Kidneys: Bilateral simple renal cysts. Normal enhancement and delayed excretion of contrast. LEFT ureter normal. RIGHT ureter normal.  Stomach:  Distended with oral contrast.  No inflammatory changes.  Small bowel: Duodenum appears normal. Jejunum is also normal. There is thickening of the terminal ileum with a small amount of fluid surrounding the terminal ileum in the anatomic pelvis. Mural thickening measures up to 8 mm. Stranding is present in the mesenteric fat.  Colon: Mural thickening of the ascending colon is present. The hepatic flexure the colon and transverse cul are mildly dilated with contrast and stool mixed producing fluid levels. Colonic diverticulosis is present. No focal inflammatory changes to suggest acute diverticulitis. There may be some mural thickening of the sigmoid colon present as well however evaluation is difficult and the thickening may be secondary to diverticular hypertrophy.  Pelvic Genitourinary: Hysterectomy. Urinary bladder is mostly collapsed but appears within normal limits.  Peritoneum: No intra-abdominal free air. Small amount of free fluid in the anatomic pelvis, probably reactive.  Vasculature: Atherosclerosis. Visceral atherosclerosis is also present. The visible mesenteric vessels opacified normally.  Body Wall: Fat containing periumbilical hernia.  IMPRESSION: 1. Short segment thickening of the terminal ileum and another segment of proximal colonic mural thickening. The differential considerations are ischemia, enteric infection with inflammatory bowel disease less likely. 2. Distended gallbladder and mild intrahepatic biliary ductal dilation. Correlation with bilirubin recommended. If normal, no further evaluation warranted. This recommendation follows ACR consensus guidelines: White Paper of the ACR Incidental Findings Committee II on Gallbladder and Biliary Findings. J Am Coll Radiol  2013:;10:953-956. 3. Hysterectomy. 4. Bilateral renal cysts.   Electronically Signed   By: Andreas Newport M.D.   On: 09/07/2014 13:45    Lab Results: Basic Metabolic Panel:  Recent Labs  16/10/96 0705 09/12/14 0016  NA 143 146*  K 3.4* 4.1  CL 118* 122*  CO2 21 20  GLUCOSE 112* 105*  BUN 12 11  CREATININE 1.23* 1.22*  CALCIUM 8.1* 8.1*   Liver Function Tests: No results for input(s): AST, ALT, ALKPHOS, BILITOT, PROT, ALBUMIN in the last 72 hours.   CBC:  Recent Labs  09/11/14 0705 09/12/14 0016  WBC 5.6 6.4  HGB 9.2* 9.8*  HCT 27.6* 28.9*  MCV 88.5 87.8  PLT 141* 139*    Recent Results (from the past 240 hour(s))  Clostridium Difficile by PCR     Status: None   Collection Time: 09/07/14  3:41 PM  Result Value Ref Range Status   C difficile by pcr NEGATIVE NEGATIVE Final     Hospital Course:   This is a 81 years old female with history ]of multiple medical illnesses was admitted due to abdominal  pain, diarrhea and vomiting. CT Scan of the abdomen showed colitis. Patient was treated with combinations of IV antibiotics. She was evaluated by GI. Patient improved. Her antbiotics changed to po and discharged in stable conation. GI planned to do colonoscopy on out patient.  Discharge Exam: Blood pressure 157/63, pulse 70, temperature 100.5 F (38.1 C), temperature source Oral, resp. rate 16, height 5\' 4"  (1.626 m), weight 68.493 kg (151 lb), SpO2 97 %.   Disposition:  home        Follow-up Information    Follow up with [facility].   Contact information:   [address] [place] [address] [phone]       Follow up with [facility], MD In 1 week.   Specialty:  Internal Medicine   Contact information:   [address] [place] [address] [phone]       Signed: Avon Gully   09/12/2014, 11:37 AM

## 2014-09-12 NOTE — Progress Notes (Signed)
Per Case Management, for Home Health Order a Face to Face needs to be filled out by Doctor.  Called Dr. Felecia Shelling to obtain order and Face to Face.  Awaiting order and Face to Face.  Will continue to monitor pt until discharge.

## 2014-09-14 NOTE — Progress Notes (Signed)
UR chart review completed.  

## 2014-11-19 ENCOUNTER — Encounter: Payer: Self-pay | Admitting: Gastroenterology

## 2014-12-08 ENCOUNTER — Telehealth: Payer: Self-pay | Admitting: Gastroenterology

## 2014-12-08 DIAGNOSIS — K529 Noninfective gastroenteritis and colitis, unspecified: Secondary | ICD-10-CM

## 2014-12-08 NOTE — Telephone Encounter (Signed)
PLEASE CALL PT. SHE NEEDS REPEAT BMP TO CHECK HER KIDNEY FUNCTION. SHE NEEDS A CT ABD/PELVIS TO EVALUATE HER COLON AD SMALL BOWEL(DX: ILEITIS/COLITIS) BEFORE APR19. OPV FOR  APR 21 ALREADY SCHEDULED.

## 2014-12-09 ENCOUNTER — Other Ambulatory Visit: Payer: Self-pay

## 2014-12-09 DIAGNOSIS — R1011 Right upper quadrant pain: Secondary | ICD-10-CM

## 2014-12-09 NOTE — Telephone Encounter (Signed)
Spoke with pt and she is aware of needing labs repeated. Faxed order for labs [facility].  Pt states is is ok to arrange CT

## 2014-12-09 NOTE — Telephone Encounter (Signed)
CT scan was sent to further review. Faxed additional clinical notes to [facility].

## 2014-12-14 NOTE — Telephone Encounter (Signed)
Insurance still pending  

## 2014-12-14 NOTE — Telephone Encounter (Signed)
Pt is set up for CT scan on 4/14/@ 10:45. PA# [id number]

## 2014-12-14 NOTE — Telephone Encounter (Signed)
Pt is aware of appointment date and time

## 2014-12-17 ENCOUNTER — Other Ambulatory Visit: Payer: Self-pay | Admitting: Gastroenterology

## 2014-12-17 ENCOUNTER — Ambulatory Visit (HOSPITAL_COMMUNITY)
Admission: RE | Admit: 2014-12-17 | Discharge: 2014-12-17 | Disposition: A | Discharge status: Home / Self Care | Payer: Medicare HMO | Source: Ambulatory Visit | Attending: Gastroenterology | Admitting: Gastroenterology

## 2014-12-17 DIAGNOSIS — K5730 Diverticulosis of large intestine without perforation or abscess without bleeding: Secondary | ICD-10-CM | POA: Diagnosis not present

## 2014-12-17 DIAGNOSIS — K529 Noninfective gastroenteritis and colitis, unspecified: Secondary | ICD-10-CM | POA: Diagnosis present

## 2014-12-17 DIAGNOSIS — K5710 Diverticulosis of small intestine without perforation or abscess without bleeding: Secondary | ICD-10-CM | POA: Insufficient documentation

## 2014-12-17 LAB — POCT I-STAT CREATININE
Creatinine, Ser: 1.7 mg/dL — ABNORMAL HIGH (ref 0.50–1.10)
Creatinine, Ser: 1.8 mg/dL — ABNORMAL HIGH (ref 0.50–1.10)

## 2014-12-17 MED ORDER — IOHEXOL 300 MG/ML  SOLN: 100.0000 mL | Freq: Once | INTRAMUSCULAR | Status: AC | PRN

## 2014-12-24 ENCOUNTER — Encounter: Payer: Self-pay | Admitting: Gastroenterology

## 2014-12-24 ENCOUNTER — Other Ambulatory Visit: Payer: Self-pay

## 2014-12-24 ENCOUNTER — Ambulatory Visit (INDEPENDENT_AMBULATORY_CARE_PROVIDER_SITE_OTHER): Payer: Medicare HMO | Admitting: Gastroenterology

## 2014-12-24 VITALS — BP 123/52 | HR 68 | Temp 97.3°F | Ht 61.0 in | Wt 150.6 lb

## 2014-12-24 DIAGNOSIS — K529 Noninfective gastroenteritis and colitis, unspecified: Secondary | ICD-10-CM | POA: Diagnosis not present

## 2014-12-24 DIAGNOSIS — N179 Acute kidney failure, unspecified: Secondary | ICD-10-CM

## 2014-12-24 NOTE — Assessment & Plan Note (Signed)
HOLD LISINOPRIL. RE-START IN 7 DAYS AT HALF DOSE.  REFER TO NEPHROLOGY FOR ACUTE RENAL FAILURE IN SETTING OF CHRONIC KIDNEY DISEASE

## 2014-12-24 NOTE — Progress Notes (Signed)
cc'ed to pcp °

## 2014-12-24 NOTE — Assessment & Plan Note (Addendum)
SYMPTOMS CONTROLLED/RESOLVED.  PT DECLINED TCS AFTER DISCUSSING BENEFITS AD RISKS. REVIEWED CT RESULTS. FOLLOW UP AS NEEDED

## 2014-12-24 NOTE — Progress Notes (Signed)
Subjective:    Patient ID: [name], female    DOB: 05-26-1925, 81 y.o.   MRN: [id number]  Avon Gully, MD  HPI CURRENTLY WATCHING WHAT SHE EATS.  CONCERNED HER ANKLES ARE SWELLING. LOST HER BEST MAN 9 YRS AGO. KIDNEY FUNCTION DECREASED. BMs: 1-2X/DAY DEPENDING ON WHAT SHE EATS. MAY HAVE WATERY STOOLS/ABDOMINAL PAIN. STAYS COLD.  PT DENIES FEVER, CHILLS, HEMATOCHEZIA, nausea, vomiting, melena, CHEST PAIN, SHORTNESS OF BREATH,  CHANGE IN BOWEL IN HABITS, constipation,  problems swallowing, OR heartburn or indigestion.    Past Medical History  Diagnosis Date  . Cardiomyopathy     Nonischemic, EF 25-30% 2007 / EF improved 50% 2008, ( catheterization not done) nuclear 2007, fixed anteroapical defect, no ischemia  . Ejection fraction     EF 50%, echo, 2008  . Hypertension   . Pulmonary hypertension   . ASD (atrial septal defect)     Small  . Edema     Chronic lower extremity edema... venous insufficiency  . Hypothyroidism   . Stroke   . Diverticulitis   . Gout    Past Surgical History  Procedure Laterality Date  . Abdominal hysterectomy    . Breast surgery     Allergies  Allergen Reactions  . Aspirin Other (See Comments)    unknown   Current Outpatient Prescriptions  Medication Sig Dispense Refill  . acetaminophen-codeine (TYLENOL #3) 300-30 MG per tablet Take 1 tablet by mouth 2 (two) times daily as needed for moderate pain.    Marland Kitchen ALPRAZolam (XANAX) 0.5 MG tablet Take 0.5 mg by mouth 2 (two) times daily.    . carvedilol (COREG) 25 MG tablet Take 25 mg by mouth 2 (two) times daily with a meal.     . furosemide (LASIX) 20 MG tablet     . gabapentin (NEURONTIN) 100 MG capsule     . hydrALAZINE (APRESOLINE) 50 MG tablet Take 50 mg by mouth 2 (two) times daily.    Marland Kitchen levothyroxine (SYNTHROID, LEVOTHROID) 75 MCG tablet Take 75 mcg by mouth daily.      Marland Kitchen lisinopril (PRINIVIL,ZESTRIL) 20 MG tablet Take 20 mg by mouth daily.      . meclizine (ANTIVERT) 25 MG tablet Take  25 mg by mouth 3 (three) times daily as needed for dizziness.     . pantoprazole (PROTONIX) 40 MG tablet Take 1 tablet (40 mg total) by mouth 2 (two) times daily.    . potassium chloride SA (K-DUR,KLOR-CON) 20 MEQ tablet     . tobramycin (TOBREX) 0.3 % ophthalmic solution Place 2 drops into the left eye every 4 (four) hours.    . vitamin B-12 (CYANOCOBALAMIN) 1000 MCG tablet Take 1,000 mcg by mouth daily.    .      .       Review of Systems     Objective:   Physical Exam  Constitutional: She is oriented to person, place, and time. She appears well-developed and well-nourished. No distress.  HENT:  Head: Normocephalic and atraumatic.  Mouth/Throat: Oropharynx is clear and moist. No oropharyngeal exudate.  Eyes: Pupils are equal, round, and reactive to light. No scleral icterus.  Neck: Normal range of motion. Neck supple.  Cardiovascular: Normal rate, regular rhythm and normal heart sounds.   Pulmonary/Chest: Effort normal and breath sounds normal. No respiratory distress.  Abdominal: Soft. Bowel sounds are normal. She exhibits no distension. There is no tenderness.  Musculoskeletal: She exhibits edema.  Lymphadenopathy:    She has no cervical adenopathy.  Neurological: She is alert and oriented to person, place, and time.  NO  NEW FOCAL DEFICITS   Psychiatric: She has a normal mood and affect.  Vitals reviewed.         Assessment & Plan:

## 2014-12-24 NOTE — Patient Instructions (Addendum)
PLEASE SEE THE KIDNEY DOCTOR TO ASSESS MEDICATION ADJUSTMENTS THAT MAY IMPROVE YOUR KIDNEY FUNCTION.   HOLD YOUR LISINOPRIL FOR 7 DAYS THEN START TAKING HALF THE DOSE.   CALL ME IF YOU NEED ME.

## 2015-02-10 ENCOUNTER — Emergency Department (HOSPITAL_COMMUNITY): Payer: Medicare HMO

## 2015-02-10 ENCOUNTER — Observation Stay (HOSPITAL_COMMUNITY)
Admission: EM | Admit: 2015-02-10 | Discharge: 2015-02-12 | Disposition: A | Discharge status: Home / Self Care | Payer: Medicare HMO | Attending: Internal Medicine | Admitting: Internal Medicine

## 2015-02-10 ENCOUNTER — Encounter (HOSPITAL_COMMUNITY): Payer: Self-pay | Admitting: Cardiology

## 2015-02-10 DIAGNOSIS — M539 Dorsopathy, unspecified: Secondary | ICD-10-CM | POA: Insufficient documentation

## 2015-02-10 DIAGNOSIS — M791 Myalgia: Secondary | ICD-10-CM | POA: Diagnosis not present

## 2015-02-10 DIAGNOSIS — N182 Chronic kidney disease, stage 2 (mild): Secondary | ICD-10-CM

## 2015-02-10 DIAGNOSIS — M5416 Radiculopathy, lumbar region: Principal | ICD-10-CM | POA: Insufficient documentation

## 2015-02-10 DIAGNOSIS — Z79899 Other long term (current) drug therapy: Secondary | ICD-10-CM | POA: Diagnosis not present

## 2015-02-10 DIAGNOSIS — I129 Hypertensive chronic kidney disease with stage 1 through stage 4 chronic kidney disease, or unspecified chronic kidney disease: Secondary | ICD-10-CM | POA: Insufficient documentation

## 2015-02-10 DIAGNOSIS — R300 Dysuria: Secondary | ICD-10-CM | POA: Diagnosis not present

## 2015-02-10 DIAGNOSIS — E039 Hypothyroidism, unspecified: Secondary | ICD-10-CM | POA: Insufficient documentation

## 2015-02-10 DIAGNOSIS — N189 Chronic kidney disease, unspecified: Secondary | ICD-10-CM | POA: Diagnosis not present

## 2015-02-10 DIAGNOSIS — Z792 Long term (current) use of antibiotics: Secondary | ICD-10-CM | POA: Insufficient documentation

## 2015-02-10 DIAGNOSIS — N179 Acute kidney failure, unspecified: Secondary | ICD-10-CM | POA: Diagnosis present

## 2015-02-10 DIAGNOSIS — R350 Frequency of micturition: Secondary | ICD-10-CM | POA: Diagnosis not present

## 2015-02-10 DIAGNOSIS — I429 Cardiomyopathy, unspecified: Secondary | ICD-10-CM | POA: Insufficient documentation

## 2015-02-10 DIAGNOSIS — D649 Anemia, unspecified: Secondary | ICD-10-CM | POA: Diagnosis not present

## 2015-02-10 DIAGNOSIS — Q211 Atrial septal defect: Secondary | ICD-10-CM | POA: Diagnosis not present

## 2015-02-10 DIAGNOSIS — I10 Essential (primary) hypertension: Secondary | ICD-10-CM | POA: Diagnosis present

## 2015-02-10 DIAGNOSIS — M109 Gout, unspecified: Secondary | ICD-10-CM | POA: Insufficient documentation

## 2015-02-10 DIAGNOSIS — I428 Other cardiomyopathies: Secondary | ICD-10-CM

## 2015-02-10 DIAGNOSIS — M549 Dorsalgia, unspecified: Secondary | ICD-10-CM | POA: Diagnosis present

## 2015-02-10 DIAGNOSIS — Z8673 Personal history of transient ischemic attack (TIA), and cerebral infarction without residual deficits: Secondary | ICD-10-CM | POA: Insufficient documentation

## 2015-02-10 DIAGNOSIS — G8929 Other chronic pain: Secondary | ICD-10-CM | POA: Insufficient documentation

## 2015-02-10 DIAGNOSIS — K5792 Diverticulitis of intestine, part unspecified, without perforation or abscess without bleeding: Secondary | ICD-10-CM | POA: Diagnosis not present

## 2015-02-10 HISTORY — DX: Anemia, unspecified: D64.9

## 2015-02-10 HISTORY — DX: Dorsopathy, unspecified: M53.9

## 2015-02-10 HISTORY — DX: Other chronic pain: G89.29

## 2015-02-10 HISTORY — DX: Dorsalgia, unspecified: M54.9

## 2015-02-10 HISTORY — DX: Chronic kidney disease, unspecified: N18.9

## 2015-02-10 LAB — BASIC METABOLIC PANEL
Anion gap: 11 (ref 5–15)
BUN: 45 mg/dL — AB (ref 6–20)
CHLORIDE: 102 mmol/L (ref 101–111)
CO2: 28 mmol/L (ref 22–32)
Calcium: 8.9 mg/dL (ref 8.9–10.3)
Creatinine, Ser: 1.83 mg/dL — ABNORMAL HIGH (ref 0.44–1.00)
GFR calc Af Amer: 27 mL/min — ABNORMAL LOW (ref >60)
GFR, EST NON AFRICAN AMERICAN: 24 mL/min — AB (ref >60)
Glucose, Bld: 112 mg/dL — ABNORMAL HIGH (ref 65–99)
Potassium: 3.8 mmol/L (ref 3.5–5.1)
Sodium: 141 mmol/L (ref 135–145)

## 2015-02-10 LAB — CBC WITH DIFFERENTIAL/PLATELET
BASOS PCT: 0 % (ref 0–1)
Basophils Absolute: 0 10*3/uL (ref 0.0–0.1)
EOS PCT: 3 % (ref 0–5)
Eosinophils Absolute: 0.2 10*3/uL (ref 0.0–0.7)
HEMATOCRIT: 31.3 % — AB (ref 36.0–46.0)
Hemoglobin: 10.1 g/dL — ABNORMAL LOW (ref 12.0–15.0)
LYMPHS ABS: 1.3 10*3/uL (ref 0.7–4.0)
Lymphocytes Relative: 27 % (ref 12–46)
MCH: 28.9 pg (ref 26.0–34.0)
MCHC: 32.3 g/dL (ref 30.0–36.0)
MCV: 89.7 fL (ref 78.0–100.0)
Monocytes Absolute: 0.2 10*3/uL (ref 0.1–1.0)
Monocytes Relative: 4 % (ref 3–12)
NEUTROS PCT: 66 % (ref 43–77)
Neutro Abs: 3.1 10*3/uL (ref 1.7–7.7)
Platelets: 196 10*3/uL (ref 150–400)
RBC: 3.49 MIL/uL — ABNORMAL LOW (ref 3.87–5.11)
RDW: 13 % (ref 11.5–15.5)
WBC: 4.7 10*3/uL (ref 4.0–10.5)

## 2015-02-10 LAB — URINALYSIS, ROUTINE W REFLEX MICROSCOPIC
BILIRUBIN URINE: NEGATIVE
Glucose, UA: NEGATIVE mg/dL
Hgb urine dipstick: NEGATIVE
Ketones, ur: NEGATIVE mg/dL
LEUKOCYTES UA: NEGATIVE
NITRITE: NEGATIVE
Protein, ur: NEGATIVE mg/dL
Specific Gravity, Urine: 1.01 (ref 1.005–1.030)
UROBILINOGEN UA: 0.2 mg/dL (ref 0.0–1.0)
pH: 5.5 (ref 5.0–8.0)

## 2015-02-10 LAB — TSH: TSH: 1.17 u[IU]/mL (ref 0.350–4.500)

## 2015-02-10 MED ORDER — CARVEDILOL 12.5 MG PO TABS
25.0000 mg | ORAL_TABLET | Freq: Two times a day (BID) | ORAL | Status: DC
Start: 2015-02-10 — End: 2015-02-12
  Administered 2015-02-10 – 2015-02-12 (×4): 25 mg via ORAL
  Filled 2015-02-10 (×4): qty 2

## 2015-02-10 MED ORDER — MORPHINE SULFATE 2 MG/ML IJ SOLN
2.0000 mg | Freq: Once | INTRAMUSCULAR | Status: DC
  Filled 2015-02-10: qty 1

## 2015-02-10 MED ORDER — MAGNESIUM CITRATE PO SOLN: 1.0000 | Freq: Once | ORAL | Status: AC | PRN

## 2015-02-10 MED ORDER — ACETAMINOPHEN 650 MG RE SUPP: 650.0000 mg | Freq: Four times a day (QID) | RECTAL | Status: DC | PRN

## 2015-02-10 MED ORDER — ONDANSETRON HCL 4 MG PO TABS: 4.0000 mg | ORAL_TABLET | Freq: Four times a day (QID) | ORAL | Status: DC | PRN

## 2015-02-10 MED ORDER — ALPRAZOLAM 0.5 MG PO TABS
0.5000 mg | ORAL_TABLET | Freq: Two times a day (BID) | ORAL | Status: DC
  Administered 2015-02-10 – 2015-02-12 (×4): 0.5 mg via ORAL
  Filled 2015-02-10 (×4): qty 1

## 2015-02-10 MED ORDER — DOCUSATE SODIUM 100 MG PO CAPS
100.0000 mg | ORAL_CAPSULE | Freq: Two times a day (BID) | ORAL | Status: DC
  Administered 2015-02-10 – 2015-02-12 (×3): 100 mg via ORAL
  Filled 2015-02-10 (×4): qty 1

## 2015-02-10 MED ORDER — BISACODYL 10 MG RE SUPP: 10.0000 mg | Freq: Every day | RECTAL | Status: DC | PRN

## 2015-02-10 MED ORDER — ENOXAPARIN SODIUM 30 MG/0.3ML ~~LOC~~ SOLN
30.0000 mg | SUBCUTANEOUS | Status: DC
  Administered 2015-02-10 – 2015-02-11 (×2): 30 mg via SUBCUTANEOUS
  Filled 2015-02-10 (×2): qty 0.3

## 2015-02-10 MED ORDER — ALUM & MAG HYDROXIDE-SIMETH 200-200-20 MG/5ML PO SUSP
30.0000 mL | Freq: Four times a day (QID) | ORAL | Status: DC | PRN
Start: 2015-02-10 — End: 2015-02-12

## 2015-02-10 MED ORDER — OXYCODONE-ACETAMINOPHEN 5-325 MG PO TABS
0.5000 | ORAL_TABLET | Freq: Once | ORAL | Status: AC
  Administered 2015-02-10: 0.5 via ORAL
  Filled 2015-02-10: qty 1

## 2015-02-10 MED ORDER — MORPHINE SULFATE 2 MG/ML IJ SOLN: 1.0000 mg | INTRAMUSCULAR | Status: DC | PRN

## 2015-02-10 MED ORDER — GABAPENTIN 100 MG PO CAPS
100.0000 mg | ORAL_CAPSULE | Freq: Two times a day (BID) | ORAL | Status: DC
  Administered 2015-02-10 – 2015-02-12 (×4): 100 mg via ORAL
  Filled 2015-02-10 (×4): qty 1

## 2015-02-10 MED ORDER — ACETAMINOPHEN 325 MG PO TABS: 650.0000 mg | ORAL_TABLET | Freq: Four times a day (QID) | ORAL | Status: DC | PRN

## 2015-02-10 MED ORDER — TRAZODONE HCL 50 MG PO TABS
25.0000 mg | ORAL_TABLET | Freq: Every evening | ORAL | Status: DC | PRN
  Administered 2015-02-12: 25 mg via ORAL
  Filled 2015-02-10 (×2): qty 1

## 2015-02-10 MED ORDER — LEVOTHYROXINE SODIUM 75 MCG PO TABS
75.0000 ug | ORAL_TABLET | Freq: Every day | ORAL | Status: DC
  Administered 2015-02-11 – 2015-02-12 (×2): 75 ug via ORAL
  Filled 2015-02-10 (×2): qty 1

## 2015-02-10 MED ORDER — PREDNISONE 20 MG PO TABS
60.0000 mg | ORAL_TABLET | Freq: Every day | ORAL | Status: DC
  Administered 2015-02-11 – 2015-02-12 (×2): 60 mg via ORAL
  Filled 2015-02-10 (×2): qty 3

## 2015-02-10 MED ORDER — ONDANSETRON HCL 4 MG/2ML IJ SOLN
4.0000 mg | Freq: Once | INTRAMUSCULAR | Status: DC
  Filled 2015-02-10: qty 2

## 2015-02-10 MED ORDER — ENSURE ENLIVE PO LIQD
237.0000 mL | Freq: Two times a day (BID) | ORAL | Status: DC
  Administered 2015-02-11 – 2015-02-12 (×3): 237 mL via ORAL

## 2015-02-10 MED ORDER — PANTOPRAZOLE SODIUM 40 MG PO TBEC
40.0000 mg | DELAYED_RELEASE_TABLET | Freq: Two times a day (BID) | ORAL | Status: DC
  Administered 2015-02-10 – 2015-02-12 (×4): 40 mg via ORAL
  Filled 2015-02-10 (×4): qty 1

## 2015-02-10 MED ORDER — HYDRALAZINE HCL 25 MG PO TABS
50.0000 mg | ORAL_TABLET | Freq: Two times a day (BID) | ORAL | Status: DC
  Administered 2015-02-10 – 2015-02-12 (×4): 50 mg via ORAL
  Filled 2015-02-10 (×4): qty 2

## 2015-02-10 MED ORDER — MECLIZINE HCL 12.5 MG PO TABS: 25.0000 mg | ORAL_TABLET | Freq: Three times a day (TID) | ORAL | Status: DC | PRN

## 2015-02-10 MED ORDER — VITAMIN B-12 1000 MCG PO TABS
1000.0000 ug | ORAL_TABLET | Freq: Every day | ORAL | Status: DC
  Administered 2015-02-10 – 2015-02-12 (×3): 1000 ug via ORAL
  Filled 2015-02-10 (×3): qty 1

## 2015-02-10 MED ORDER — HYDROCODONE-ACETAMINOPHEN 5-325 MG PO TABS: 1.0000 | ORAL_TABLET | ORAL | Status: DC | PRN

## 2015-02-10 MED ORDER — SODIUM CHLORIDE 0.9 % IV SOLN
INTRAVENOUS | Status: AC
  Administered 2015-02-10: 16:00:00 via INTRAVENOUS

## 2015-02-10 MED ORDER — ONDANSETRON HCL 4 MG/2ML IJ SOLN: 4.0000 mg | Freq: Four times a day (QID) | INTRAMUSCULAR | Status: DC | PRN

## 2015-02-10 NOTE — H&P (Signed)
[facility] History and Physical  [name] BJY:[id number] DOB: 04-16-1925 DOA: 02/10/2015  Referring physician: Blinda Leatherwood PCP: Avon Gully, MD   Chief Complaint: back pain  HPI: [name] is a very pleasant 81 y.o. female with a past medical history that includes nonischemic cardiomyopathy EF 50% in 2008, hypertension, atrial septal defect, hypothyroidism, diverticulosis presents to the emergency department with the chief complaint of intractable back pain. Initial evaluation included an MRI revealing advanced degenerative disc disease at L5-S1 with some edema and paraspinous muscles that could be indicative of infection. Patient reports intermittent mild low back pain since January of this year. She reports 5 days ago she was on her hands and knees in the garden planting flowers and rearranging small ground cover rocks. Daughter reports noticing that patient was "moving a little slower over the weekend". Sunday morning when patient was getting out of the bathtub he noticed "a Jaedyn Lard mark on my backside". She proceeded to "twist and turn looking in the mirror trying to identify this Loraine Leriche. Daughter reports Felica Chargois mark was a tic which she removed with alcohol. She states that tick was not very deep". During the day on Sunday daughter reports patient continued to move slowly upon getting up Monday morning patient had difficulty getting out of bed due to low back pain. Patient describes the pain as constant worse with movement and weightbearing located across her lumbar spine radiating to her right but are down her right thigh. This pain progressed reducing her mobility to the point she couldn't get out of bed at all today. Daughter states that she assisted patient to a standing position and weightbearing increases the pain. Standing up straight seemed to increase the pain as well. Daughter does not report any lower extremity weakness. Patient denies numbness or tingling of her lower  extremities. She denies any incontinence of urine but does report frequency and urgency. She denies any fever chills nausea vomiting diarrhea.  Workup in the emergency department includes metabolic panel with creatinine of 1.83 BUN 45 serum glucose 112, complete blood count with a hemoglobin of 10.1. X-ray of the lumbar spine shows moderate to severe multilevel degenerative disc disease. MRI of the spine shows advanced disc degeneration at L5-S1 with endplate edema, bone marrow edema extending to the pedicles, edema in the paraspinous muscles.  In the emergency department she is hemodynamically stable afebrile and not hypoxic. She is provided with Percocet and morphine.   Review of Systems:   Past Medical History  Diagnosis Date  . Cardiomyopathy     Nonischemic, EF 25-30% 2007 / EF improved 50% 2008, ( catheterization not done) nuclear 2007, fixed anteroapical defect, no ischemia  . Ejection fraction     EF 50%, echo, 2008  . Hypertension   . Pulmonary hypertension   . ASD (atrial septal defect)     Small  . Edema     Chronic lower extremity edema... venous insufficiency  . Hypothyroidism   . Stroke   . Diverticulitis   . Gout   . CKD (chronic kidney disease)   . Chronic back pain greater than 3 months duration   . Anemia    Past Surgical History  Procedure Laterality Date  . Abdominal hysterectomy    . Breast surgery     Social History:  reports that she has never smoked. She has never used smokeless tobacco. She reports that she does not drink alcohol or use illicit drugs. Patient lives at home with her daughter. She is usually quite  independent with ADLs and needs no assistance with ambulation Allergies  Allergen Reactions  . Aspirin Other (See Comments)    unknown    Family History  Problem Relation Age of Onset  . Coronary artery disease      POSITIVE HX IN FAMILY     Prior to Admission medications   Medication Sig Start Date End Date Taking? Authorizing  Provider  acetaminophen-codeine (TYLENOL #3) 300-30 MG per tablet Take 1 tablet by mouth 2 (two) times daily as needed for moderate pain.   Yes Historical Provider, MD  ALPRAZolam Prudy Feeler) 0.5 MG tablet Take 0.5 mg by mouth 2 (two) times daily.   Yes Historical Provider, MD  carvedilol (COREG) 25 MG tablet Take 25 mg by mouth 2 (two) times daily with a meal.    Yes Historical Provider, MD  furosemide (LASIX) 20 MG tablet Take 20 mg by mouth 2 (two) times daily.  12/09/14  Yes Historical Provider, MD  gabapentin (NEURONTIN) 100 MG capsule Take 100 mg by mouth 2 (two) times daily.  12/09/14  Yes Historical Provider, MD  hydrALAZINE (APRESOLINE) 50 MG tablet Take 50 mg by mouth 2 (two) times daily.   Yes Historical Provider, MD  levothyroxine (SYNTHROID, LEVOTHROID) 75 MCG tablet Take 75 mcg by mouth daily.     Yes Historical Provider, MD  lisinopril (PRINIVIL,ZESTRIL) 20 MG tablet Take 20 mg by mouth daily.     Yes Historical Provider, MD  meclizine (ANTIVERT) 25 MG tablet Take 25 mg by mouth 3 (three) times daily as needed for dizziness.  06/13/13  Yes Historical Provider, MD  pantoprazole (PROTONIX) 40 MG tablet Take 1 tablet (40 mg total) by mouth 2 (two) times daily. 09/12/14  Yes Avon Gully, MD  potassium chloride SA (K-DUR,KLOR-CON) 20 MEQ tablet Take 20 mEq by mouth daily.  12/09/14  Yes Historical Provider, MD  vitamin B-12 (CYANOCOBALAMIN) 1000 MCG tablet Take 1,000 mcg by mouth daily.   Yes Historical Provider, MD   Physical Exam: Filed Vitals:   02/10/15 1226 02/10/15 1348 02/10/15 1349 02/10/15 1400  BP: 107/71 122/46  118/49  Pulse: 56  105 51  Temp:      TempSrc:      Resp:      Height:      Weight:      SpO2: 99%  98% 99%    Wt Readings from Last 3 Encounters:  02/10/15 66.225 kg (146 lb)  12/24/14 68.312 kg (150 lb 9.6 oz)  09/07/14 68.493 kg (151 lb)    General:  Appears calm and comfortable somewhat drowsy Eyes: PERRL, normal lids, irises & conjunctiva ENT: grossly normal  hearing, lips & tongue Neck: no LAD, masses or thyromegaly Cardiovascular: RRR, no m/r/g. No LE edema. Pedal pulses present and palpable Telemetry: SR, no arrhythmias  Respiratory: CTA bilaterally, no w/r/r. Normal respiratory effort. Abdomen: soft, ntnd positive bowel sounds throughout no guarding or rebounding Skin: no rash or induration seen on limited exam Musculoskeletal: grossly normal tone BUE/BLE, joints without swelling or erythema Psychiatric: grossly normal mood and affect, speech fluent and appropriate Neurologic: grossly non-focal. Speech slow but clear cranial nerves II through XII grossly intact. Lower extremity strength 4 out of 5 on right 5 out of 5 on left able to complete straight leg left bilaterally. Sensation to lower extremities intact bilaterally           Labs on Admission:  Basic Metabolic Panel:  Recent Labs Lab 02/10/15 1037  NA 141  K 3.8  CL 102  CO2 28  GLUCOSE 112*  BUN 45*  CREATININE 1.83*  CALCIUM 8.9   Liver Function Tests: No results for input(s): AST, ALT, ALKPHOS, BILITOT, PROT, ALBUMIN in the last 168 hours. No results for input(s): LIPASE, AMYLASE in the last 168 hours. No results for input(s): AMMONIA in the last 168 hours. CBC:  Recent Labs Lab 02/10/15 1037  WBC 4.7  NEUTROABS 3.1  HGB 10.1*  HCT 31.3*  MCV 89.7  PLT 196   Cardiac Enzymes: No results for input(s): CKTOTAL, CKMB, CKMBINDEX, TROPONINI in the last 168 hours.  BNP (last 3 results) No results for input(s): BNP in the last 8760 hours.  ProBNP (last 3 results) No results for input(s): PROBNP in the last 8760 hours.  CBG: No results for input(s): GLUCAP in the last 168 hours.  Radiological Exams on Admission: Dg Lumbar Spine Complete  02/10/2015   CLINICAL DATA:  Low back pain radiating to the right leg since working in the garden 3 weeks ago. Otherwise, no known injury.  EXAM: LUMBAR SPINE - COMPLETE 4+ VIEW  COMPARISON:  CT abdomen pelvis - 12/17/2014   FINDINGS: The lateral radiograph is degraded due to obliquity.  There are 6 non rib-bearing lumbar type vertebral bodies. For the purposes of this dictation, the 6 non rib-bearing lumbar type vertebral bodies will be labeled L1 through L6.  There is a mild scoliotic curvature of the thoracolumbar spine with dominant caudal component convex to the right, presumably degenerative in etiology. No anterolisthesis or retrolisthesis.  Lumbar vertebral body heights are grossly preserved given obliquity.  Moderate to severe multilevel lumbar spine DDD, worse at L4-L5 and L5-L6 with disc space height loss, endplate irregularity and sclerosis.  Limited visualization the bilateral SI joints and hips is normal.  Multiple phleboliths overlie the right lower hemipelvis. Vascular calcifications overlie the lower pelvis.  Regional bowel gas pattern appears normal.  IMPRESSION: 1. Transitional anatomy with spinal labeling as above. 2. No acute findings. 3. Moderate to severe multilevel lumbar spine DDD.   Electronically Signed   By: Simonne Come M.D.   On: 02/10/2015 11:51   Mr Lumbar Spine Wo Contrast  02/10/2015   CLINICAL DATA:  Low back pain.  Lumbar radiculopathy, acute  EXAM: MRI LUMBAR SPINE WITHOUT CONTRAST  TECHNIQUE: Multiplanar, multisequence MR imaging of the lumbar spine was performed. No intravenous contrast was administered.  COMPARISON:  CT abdomen pelvis.  No prior MRI for comparison.  FINDINGS: Heterogeneous bone marrow signal diffusely. Focal T2 hyperintensities in the posterior vertebral bodies at T11, T12, and L1. These are mixed intermediate and high signal intensity on T1 and show sclerosis on the prior CT. No bony destruction on x-ray. These are probably benign lesions such is hemangiomata although not typical. No fracture is identified. Conus medullaris normal and terminates at L1-2.  Bilateral renal cysts are noted.  T12-L1:  Mild facet degeneration without spinal stenosis.  L1-2: Disc bulging and facet  hypertrophy.  Mild spinal stenosis.  L2-3: Diffuse bulging of the disc. Bilateral facet and ligamentum flavum hypertrophy causing moderate spinal stenosis. Mild foraminal narrowing bilaterally.  L3-4: Diffuse bulging of the disc. Moderate facet and ligamentum flavum hypertrophy with moderate to severe spinal stenosis. Mild foraminal narrowing on the left  L4-5: 5 mm anterior slip. Moderate disc degeneration. Advanced facet hypertrophy bilaterally causing severe spinal stenosis. Subarticular stenosis bilaterally. Neural foramina are adequately patent  L5-S1: 3 mm anterior slip. Advanced disc degeneration with mild spurring. Bilateral facet hypertrophy. Moderate spinal  stenosis and moderate foraminal stenosis bilaterally. There is edema in the endplates at L5-S1 related to disc degeneration. There is edema extending into the pedicles bilaterally most likely due to disc and facet degeneration. There is edema extending into the paraspinous muscles bilaterally left greater than right which may be due to muscle strain. There is atrophy of the paraspinous muscles bilaterally without fluid collection or mass lesion.  IMPRESSION: Diffusely heterogeneous bone marrow signal with T2 bright lesions involving T11, T12, and L1. These are indeterminate but probably benign, possibly atypical hemangiomata.  Moderate spinal stenosis L2-3  Moderate to severe spinal stenosis L3-4  Severe spinal stenosis L4-5  Spinal and foraminal stenosis bilaterally L5-S1. There is advanced disc degeneration at L5-S1 with endplate edema. There is bone marrow edema extending into the pedicles. There is edema in the paraspinous muscles at this level most likely related to facet degeneration. If the patient has fever or elevated white count, infection could be considered.   Electronically Signed   By: Marlan Palau M.D.   On: 02/10/2015 13:19    EK  Assessment/Plan Principal Problem:   Intractable back pain: In patient with ongoing degenerative  disc disease and chronic back pain. Likely related to recent afternoon of gardening and sharp twisting at the waist. MRI as noted above with stated concern for infection. MRI evaluated by Dr Franky Macho with neurosurgery who opine edema/stenosis chronic and did not have concern for infection. Patient is afebrile nontoxic appearing. WBCs within the limits of normal. Neuro exam benign. Will provide steroid burst.  Will admit for pain management and PT evaluation.  Active Problems: Acute on chronic renal failure: stage II. Likely related to decreased oral intake last 2 days. Appears slightly dry. Will gently hydrate and hold lasix for a day. Will monitor urine output. Recheck in am    Nonischemic cardiomyopathy: EF 50% 2008 per chart review. She is on low dose lasix for LE edema. Will hold this for now. Currently does not appear overloaded    Hypertension: fair control. Home medications include hydralazine, lasix, lisinopril. Will hold lisinopril due to #2 and continue others. Monitor closely     Hypothyroidism: check TSH.    Anemia: mild. Likely related to ckd. Current Hg at baseline. No s/sx bleeding. monitor     Paged Dr Franky Macho with neurosurgery  Code Status: DNR DVT Prophylaxis: Family Communication: daughter at bedside Disposition Plan: home likely tomorrow  Time spent: 60 minutes  [facility] Pager [phone]

## 2015-02-10 NOTE — ED Notes (Signed)
Lower back pain times 3 weeks that radiates down right leg.  Pain worse since working in flowers last week.

## 2015-02-10 NOTE — ED Provider Notes (Signed)
CSN: [id number]     Arrival date & time 02/10/15  1001 History  This chart was scribed for Gilda Crease, MD by Lyndel Safe, ED Scribe. This patient was seen in room APA19/APA19 and the patient's care was started 10:14 AM.   Chief Complaint  Patient presents with  . Back Pain   The history is provided by the patient and a relative. No language interpreter was used.    HPI Comments: [name] is a 81 y.o. female who presents to the Emergency Department complaining of a constant, moderate lower back pain onset 3 days ago. She reports associated radiation of the pain down her right leg. Pt notes the pain is exacerbated on movement/ambulation. She is having trouble getting out of bed and moving around per daughter.  Per granddaughter, pt also complains of dysuria and urination frequency onset 1 day ago.   Past Medical History  Diagnosis Date  . Cardiomyopathy     Nonischemic, EF 25-30% 2007 / EF improved 50% 2008, ( catheterization not done) nuclear 2007, fixed anteroapical defect, no ischemia  . Ejection fraction     EF 50%, echo, 2008  . Hypertension   . Pulmonary hypertension   . ASD (atrial septal defect)     Small  . Edema     Chronic lower extremity edema... venous insufficiency  . Hypothyroidism   . Stroke   . Diverticulitis   . Gout    Past Surgical History  Procedure Laterality Date  . Abdominal hysterectomy    . Breast surgery     Family History  Problem Relation Age of Onset  . Coronary artery disease      POSITIVE HX IN FAMILY   History  Substance Use Topics  . Smoking status: Never Smoker   . Smokeless tobacco: Never Used  . Alcohol Use: No   OB History    No data available     Review of Systems  Constitutional: Negative for fever.  Genitourinary: Positive for dysuria and frequency.  Musculoskeletal: Positive for myalgias and arthralgias.  All other systems reviewed and are negative.   Allergies  Aspirin  Home Medications   Prior  to Admission medications   Medication Sig Start Date End Date Taking? Authorizing Provider  acetaminophen-codeine (TYLENOL #3) 300-30 MG per tablet Take 1 tablet by mouth 2 (two) times daily as needed for moderate pain.   Yes Historical Provider, MD  ALPRAZolam Prudy Feeler) 0.5 MG tablet Take 0.5 mg by mouth 2 (two) times daily.   Yes Historical Provider, MD  carvedilol (COREG) 25 MG tablet Take 25 mg by mouth 2 (two) times daily with a meal.    Yes Historical Provider, MD  furosemide (LASIX) 20 MG tablet Take 20 mg by mouth 2 (two) times daily.  12/09/14  Yes Historical Provider, MD  gabapentin (NEURONTIN) 100 MG capsule Take 100 mg by mouth 2 (two) times daily.  12/09/14  Yes Historical Provider, MD  hydrALAZINE (APRESOLINE) 50 MG tablet Take 50 mg by mouth 2 (two) times daily.   Yes Historical Provider, MD  levothyroxine (SYNTHROID, LEVOTHROID) 75 MCG tablet Take 75 mcg by mouth daily.     Yes Historical Provider, MD  lisinopril (PRINIVIL,ZESTRIL) 20 MG tablet Take 20 mg by mouth daily.     Yes Historical Provider, MD  meclizine (ANTIVERT) 25 MG tablet Take 25 mg by mouth 3 (three) times daily as needed for dizziness.  06/13/13  Yes Historical Provider, MD  pantoprazole (PROTONIX) 40 MG tablet Take  1 tablet (40 mg total) by mouth 2 (two) times daily. 09/12/14  Yes Avon Gully, MD  potassium chloride SA (K-DUR,KLOR-CON) 20 MEQ tablet Take 20 mEq by mouth daily.  12/09/14  Yes Historical Provider, MD  vitamin B-12 (CYANOCOBALAMIN) 1000 MCG tablet Take 1,000 mcg by mouth daily.   Yes Historical Provider, MD  ciprofloxacin (CIPRO) 500 MG tablet Take 1 tablet (500 mg total) by mouth 2 (two) times daily. Patient not taking: Reported on 12/24/2014 09/12/14   Avon Gully, MD  metroNIDAZOLE (FLAGYL) 500 MG tablet Take 1 tablet (500 mg total) by mouth 3 (three) times daily. Patient not taking: Reported on 12/24/2014 09/12/14   Avon Gully, MD   BP 111/59 mmHg  Pulse 60  Temp(Src) 97.9 F (36.6 C) (Oral)  Resp 18   Ht 5\' 4"  (1.626 m)  Wt 146 lb (66.225 kg)  BMI 25.05 kg/m2  SpO2 98%  Physical Exam  Constitutional: She is oriented to person, place, and time. She appears well-developed and well-nourished. No distress.  HENT:  Head: Normocephalic and atraumatic.  Right Ear: Hearing normal.  Left Ear: Hearing normal.  Nose: Nose normal.  Mouth/Throat: Oropharynx is clear and moist and mucous membranes are normal.  Eyes: Conjunctivae and EOM are normal. Pupils are equal, round, and reactive to light.  Neck: Normal range of motion. Neck supple.  Cardiovascular: Regular rhythm, S1 normal and S2 normal.  Exam reveals no gallop and no friction rub.   No murmur heard. Pulmonary/Chest: Effort normal and breath sounds normal. No respiratory distress. She exhibits no tenderness.  Abdominal: Soft. Normal appearance and bowel sounds are normal. There is no hepatosplenomegaly. There is no tenderness. There is no rebound, no guarding, no tenderness at McBurney's point and negative Murphy's sign. No hernia.  Musculoskeletal: Normal range of motion.  Neurological: She is alert and oriented to person, place, and time. She has normal strength. No cranial nerve deficit or sensory deficit. Coordination normal. GCS eye subscore is 4. GCS verbal subscore is 5. GCS motor subscore is 6.  Skin: Skin is warm, dry and intact. No rash noted. No cyanosis.  Psychiatric: She has a normal mood and affect. Her speech is normal and behavior is normal. Thought content normal.  Nursing note and vitals reviewed.   ED Course  Procedures  DIAGNOSTIC STUDIES: Oxygen Saturation is 98% on RA, normal by my interpretation.    COORDINATION OF CARE: 10:19 AM Discussed treatment plan which with pt and family. Pt and family acknowledge and agrees to plan.   Labs Review Labs Reviewed  CBC WITH DIFFERENTIAL/PLATELET - Abnormal; Notable for the following:    RBC 3.49 (*)    Hemoglobin 10.1 (*)    HCT 31.3 (*)    All other components  within normal limits  BASIC METABOLIC PANEL - Abnormal; Notable for the following:    Glucose, Bld 112 (*)    BUN 45 (*)    Creatinine, Ser 1.83 (*)    GFR calc non Af Amer 24 (*)    GFR calc Af Amer 27 (*)    All other components within normal limits  URINALYSIS, ROUTINE W REFLEX MICROSCOPIC (NOT AT [facility])    Imaging Review Dg Lumbar Spine Complete  02/10/2015   CLINICAL DATA:  Low back pain radiating to the right leg since working in the garden 3 weeks ago. Otherwise, no known injury.  EXAM: LUMBAR SPINE - COMPLETE 4+ VIEW  COMPARISON:  CT abdomen pelvis - 12/17/2014  FINDINGS: The lateral radiograph is  degraded due to obliquity.  There are 6 non rib-bearing lumbar type vertebral bodies. For the purposes of this dictation, the 6 non rib-bearing lumbar type vertebral bodies will be labeled L1 through L6.  There is a mild scoliotic curvature of the thoracolumbar spine with dominant caudal component convex to the right, presumably degenerative in etiology. No anterolisthesis or retrolisthesis.  Lumbar vertebral body heights are grossly preserved given obliquity.  Moderate to severe multilevel lumbar spine DDD, worse at L4-L5 and L5-L6 with disc space height loss, endplate irregularity and sclerosis.  Limited visualization the bilateral SI joints and hips is normal.  Multiple phleboliths overlie the right lower hemipelvis. Vascular calcifications overlie the lower pelvis.  Regional bowel gas pattern appears normal.  IMPRESSION: 1. Transitional anatomy with spinal labeling as above. 2. No acute findings. 3. Moderate to severe multilevel lumbar spine DDD.   Electronically Signed   By: Simonne Come M.D.   On: 02/10/2015 11:51     EKG Interpretation None      MDM   Final diagnoses:  None   lumbar radiculopathy  Patient presents to the ER for evaluation of back pain. Patient reports that she has been having problems with her back for the last couple of weeks. Pain has been progressively worsening.  Family reports that she was unable to get out of bed last night and this morning secondary to the severe pain. Patient reports that the pain radiates down her right leg. She does not have any significant weakness on examination. There is no saddle anesthesia. Family reports that she has been experiencing urinary frequency and dysuria as well. The increased urination is made worse by the fact that she cannot get out of bed to go to the bathroom. She does take Lasix.  Presents to the emergency department for evaluation of low back pain. Initial x-ray showed severe degeneration. Patient was sent for MRI. This confirms spinal stenosis and severe degenerative changes as outlined in the reports. There are also findings that could be consistent with infection. Patient does not have a white count, is afebrile, but this does not rule out infection.  Urinalysis does not show any evidence of urinary tract infection. Remainder of blood work was normal. Hospitalist to admit the patient for severe right-sided radicular low back pain with possible spinal infection.  I personally performed the services described in this documentation, which was scribed in my presence. The recorded information has been reviewed and is accurate.     Gilda Crease, MD 02/10/15 1352

## 2015-02-11 LAB — COMPREHENSIVE METABOLIC PANEL
ALBUMIN: 3 g/dL — AB (ref 3.5–5.0)
ALK PHOS: 46 U/L (ref 38–126)
ALT: 7 U/L — AB (ref 14–54)
AST: 12 U/L — ABNORMAL LOW (ref 15–41)
Anion gap: 9 (ref 5–15)
BILIRUBIN TOTAL: 0.4 mg/dL (ref 0.3–1.2)
BUN: 40 mg/dL — AB (ref 6–20)
CALCIUM: 8.5 mg/dL — AB (ref 8.9–10.3)
CO2: 28 mmol/L (ref 22–32)
Chloride: 105 mmol/L (ref 101–111)
Creatinine, Ser: 1.64 mg/dL — ABNORMAL HIGH (ref 0.44–1.00)
GFR calc Af Amer: 31 mL/min — ABNORMAL LOW (ref >60)
GFR calc non Af Amer: 27 mL/min — ABNORMAL LOW (ref >60)
GLUCOSE: 96 mg/dL (ref 65–99)
Potassium: 3.6 mmol/L (ref 3.5–5.1)
Sodium: 142 mmol/L (ref 135–145)
Total Protein: 6.1 g/dL — ABNORMAL LOW (ref 6.5–8.1)

## 2015-02-11 LAB — CBC
HEMATOCRIT: 29.4 % — AB (ref 36.0–46.0)
Hemoglobin: 9.4 g/dL — ABNORMAL LOW (ref 12.0–15.0)
MCH: 28.6 pg (ref 26.0–34.0)
MCHC: 32 g/dL (ref 30.0–36.0)
MCV: 89.4 fL (ref 78.0–100.0)
Platelets: 188 10*3/uL (ref 150–400)
RBC: 3.29 MIL/uL — AB (ref 3.87–5.11)
RDW: 12.8 % (ref 11.5–15.5)
WBC: 5.1 10*3/uL (ref 4.0–10.5)

## 2015-02-11 NOTE — Care Management Note (Signed)
Case Management Note  Patient Details  Name: NIXIE JOKINEN MRN: [id number] Date of Birth: [birth year]  Subjective/Objective:                  Pt admitted from home with intractable back pain. Pt lives with her daughter and will return home at discharge. Pt has a cane for prn use and has a walker for home use.   Action/Plan: PT recommending HH PT at discharge. Will arrange with agency of choice. Left pts daughter a VM to discuss [facility] agencies.  Expected Discharge Date:  02/13/15               Expected Discharge Plan:  Home w Home Health Services  In-House Referral:  NA  Discharge planning Services  CM Consult  Post Acute Care Choice:  Home Health Choice offered to:     DME Arranged:    DME Agency:     HH Arranged:  PT HH Agency:     Status of Service:  In process, will continue to follow  Medicare Important Message Given:    Date Medicare IM Given:    Medicare IM give by:    Date Additional Medicare IM Given:    Additional Medicare Important Message give by:     If discussed at Long Length of Stay Meetings, dates discussed:    Additional Comments:  Cheryl Flash, RN 02/11/2015, 11:59 AM

## 2015-02-11 NOTE — Progress Notes (Signed)
Initial Nutrition Assessment  DOCUMENTATION CODES:  Non-severe (moderate) malnutrition in context of chronic illness  INTERVENTION:  Ensure Enlive (each supplement provides 350kcal and 20 grams of protein) BID  NUTRITION DIAGNOSIS:  Malnutrition related to chronic illness as evidenced by mild depletion of body fat, mild depletion of muscle mass.  GOAL:  Patient will meet greater than or equal to 90% of their needs  MONITOR:  PO intake, Labs, Weight trends  REASON FOR ASSESSMENT:  Malnutrition Screening Tool    ASSESSMENT: Pt is a 81 y.o. female with a pmh that includes nonischemic cardiomyopathy EF 50% in 2008, hypertension, atrial septal defect, hypothyroidism, diverticulosis presents to the emergency department with the chief complaint of intractable back pain, MRI revealing advanced degenerative disc disease at L5-S1 with some edema and paraspinous muscles that could be indicative of infection.  Pt finishing breakfast at time of visit (ate over 50%), stating her appetite has been poor for over a week, but is starting to come back. Pt states she has been loosing weight, wt history shows 14 Lb loss in 6 mo (8.5%, not significant for time frame). Per pt, she usually eats 3 meals per day and snacks in between, loves ice-cream. She also drinks about 2 Ensures daily, has been ordered per MST protocol while in the hospital. Encouraged pt to order meals so she could choose things she likes for better PO. Per RN pt may be discharged today, will continue to monitor if pt remains hospitalized.  Labs reviewed: BUN 40, Cr 1.64, Ca 8.5, Glu 96 - 112   Height:  Ht Readings from Last 1 Encounters:  02/10/15 5\' 4"  (1.626 m)    Weight:  Wt Readings from Last 1 Encounters:  02/10/15 146 lb (66.225 kg)    Ideal Body Weight:  54.5 kg  Wt Readings from Last 10 Encounters:  02/10/15 146 lb (66.225 kg)  12/24/14 150 lb 9.6 oz (68.312 kg)  09/07/14 151 lb (68.493 kg)  09/01/14 160 lb  (72.576 kg)  12/15/13 156 lb (70.761 kg)  07/11/13 157 lb (71.215 kg)  10/04/11 167 lb (75.751 kg)  02/07/11 164 lb (74.39 kg)  08/17/10 165 lb (74.844 kg)  03/10/10 166 lb (75.297 kg)    BMI:  Body mass index is 25.05 kg/(m^2).  Estimated Nutritional Needs:  Kcal:  1400 - 1600  Protein:  75 - 90 g  Fluid:  per MD  Skin:  Reviewed, no issues  Diet Order:  Diet Heart Room service appropriate?: Yes; Fluid consistency:: Thin  EDUCATION NEEDS:  Education needs addressed   Intake/Output Summary (Last 24 hours) at 02/11/15 0948 Last data filed at 02/11/15 0001  Gross per 24 hour  Intake  152.5 ml  Output    350 ml  Net -197.5 ml    Last BM:  6/07  Jeanifer Halliday A. Milanie Rosenfield Dietetic Intern Pager: [phone] 02/11/2015 9:56 AM

## 2015-02-11 NOTE — Progress Notes (Signed)
Subjective: Patient was admitted due to severe back pain and difficulty to ambulate. MRI showed multiple degenerative changes.  Objective: Vital signs in last 24 hours: Temp:  [97.9 F (36.6 C)-98.2 F (36.8 C)] 98 F (36.7 C) (06/09 0549) Pulse Rate:  [51-105] 69 (06/09 0549) Resp:  [18-20] 20 (06/09 0549) BP: (104-134)/(46-105) 134/51 mmHg (06/09 0549) SpO2:  [97 %-100 %] 98 % (06/09 0549) Weight:  [66.225 kg (146 lb)] 66.225 kg (146 lb) (06/08 1609) Weight change:  Last BM Date: 02/09/15  Intake/Output from previous day: 06/08 0701 - 06/09 0700 In: 152.5 [I.V.:152.5] Out: 350 [Urine:350]  PHYSICAL EXAM General appearance: alert and no distress Resp: clear to auscultation bilaterally Cardio: S1, S2 normal GI: soft, non-tender; bowel sounds normal; no masses,  no organomegaly Extremities: extremities normal, atraumatic, no cyanosis or edema  Lab Results:  Results for orders placed or performed during the hospital encounter of 02/10/15 (from the past 48 hour(s))  CBC with Differential/Platelet     Status: Abnormal   Collection Time: 02/10/15 10:37 AM  Result Value Ref Range   WBC 4.7 4.0 - 10.5 K/uL   RBC 3.49 (L) 3.87 - 5.11 MIL/uL   Hemoglobin 10.1 (L) 12.0 - 15.0 g/dL   HCT 31.3 (L) 36.0 - 46.0 %   MCV 89.7 78.0 - 100.0 fL   MCH 28.9 26.0 - 34.0 pg   MCHC 32.3 30.0 - 36.0 g/dL   RDW 13.0 11.5 - 15.5 %   Platelets 196 150 - 400 K/uL   Neutrophils Relative % 66 43 - 77 %   Neutro Abs 3.1 1.7 - 7.7 K/uL   Lymphocytes Relative 27 12 - 46 %   Lymphs Abs 1.3 0.7 - 4.0 K/uL   Monocytes Relative 4 3 - 12 %   Monocytes Absolute 0.2 0.1 - 1.0 K/uL   Eosinophils Relative 3 0 - 5 %   Eosinophils Absolute 0.2 0.0 - 0.7 K/uL   Basophils Relative 0 0 - 1 %   Basophils Absolute 0.0 0.0 - 0.1 K/uL  Basic metabolic panel     Status: Abnormal   Collection Time: 02/10/15 10:37 AM  Result Value Ref Range   Sodium 141 135 - 145 mmol/L   Potassium 3.8 3.5 - 5.1 mmol/L    Chloride 102 101 - 111 mmol/L   CO2 28 22 - 32 mmol/L   Glucose, Bld 112 (H) 65 - 99 mg/dL   BUN 45 (H) 6 - 20 mg/dL   Creatinine, Ser 1.83 (H) 0.44 - 1.00 mg/dL   Calcium 8.9 8.9 - 10.3 mg/dL   GFR calc non Af Amer 24 (L) >60 mL/min   GFR calc Af Amer 27 (L) >60 mL/min    Comment: (NOTE) The eGFR has been calculated using the CKD EPI equation. This calculation has not been validated in all clinical situations. eGFR's persistently <60 mL/min signify possible Chronic Kidney Disease.    Anion gap 11 5 - 15  TSH     Status: None   Collection Time: 02/10/15 10:37 AM  Result Value Ref Range   TSH 1.170 0.350 - 4.500 uIU/mL  Urinalysis, Routine w reflex microscopic (not at [facility])     Status: None   Collection Time: 02/10/15 11:15 AM  Result Value Ref Range   Color, Urine YELLOW YELLOW   APPearance CLEAR CLEAR   Specific Gravity, Urine 1.010 1.005 - 1.030   pH 5.5 5.0 - 8.0   Glucose, UA NEGATIVE NEGATIVE mg/dL   Hgb urine dipstick  NEGATIVE NEGATIVE   Bilirubin Urine NEGATIVE NEGATIVE   Ketones, ur NEGATIVE NEGATIVE mg/dL   Protein, ur NEGATIVE NEGATIVE mg/dL   Urobilinogen, UA 0.2 0.0 - 1.0 mg/dL   Nitrite NEGATIVE NEGATIVE   Leukocytes, UA NEGATIVE NEGATIVE    Comment: MICROSCOPIC NOT DONE ON URINES WITH NEGATIVE PROTEIN, BLOOD, LEUKOCYTES, NITRITE, OR GLUCOSE <1000 mg/dL.  Comprehensive metabolic panel     Status: Abnormal   Collection Time: 02/11/15  5:57 AM  Result Value Ref Range   Sodium 142 135 - 145 mmol/L   Potassium 3.6 3.5 - 5.1 mmol/L   Chloride 105 101 - 111 mmol/L   CO2 28 22 - 32 mmol/L   Glucose, Bld 96 65 - 99 mg/dL   BUN 40 (H) 6 - 20 mg/dL   Creatinine, Ser 1.64 (H) 0.44 - 1.00 mg/dL   Calcium 8.5 (L) 8.9 - 10.3 mg/dL   Total Protein 6.1 (L) 6.5 - 8.1 g/dL   Albumin 3.0 (L) 3.5 - 5.0 g/dL   AST 12 (L) 15 - 41 U/L   ALT 7 (L) 14 - 54 U/L   Alkaline Phosphatase 46 38 - 126 U/L   Total Bilirubin 0.4 0.3 - 1.2 mg/dL   GFR calc non Af Amer 27 (L) >60 mL/min    GFR calc Af Amer 31 (L) >60 mL/min    Comment: (NOTE) The eGFR has been calculated using the CKD EPI equation. This calculation has not been validated in all clinical situations. eGFR's persistently <60 mL/min signify possible Chronic Kidney Disease.    Anion gap 9 5 - 15  CBC     Status: Abnormal   Collection Time: 02/11/15  5:57 AM  Result Value Ref Range   WBC 5.1 4.0 - 10.5 K/uL   RBC 3.29 (L) 3.87 - 5.11 MIL/uL   Hemoglobin 9.4 (L) 12.0 - 15.0 g/dL   HCT 29.4 (L) 36.0 - 46.0 %   MCV 89.4 78.0 - 100.0 fL   MCH 28.6 26.0 - 34.0 pg   MCHC 32.0 30.0 - 36.0 g/dL   RDW 12.8 11.5 - 15.5 %   Platelets 188 150 - 400 K/uL    ABGS No results for input(s): PHART, PO2ART, TCO2, HCO3 in the last 72 hours.  Invalid input(s): PCO2 CULTURES No results found for this or any previous visit (from the past 240 hour(s)). Studies/Results: Dg Lumbar Spine Complete  02/10/2015   CLINICAL DATA:  Low back pain radiating to the right leg since working in the garden 3 weeks ago. Otherwise, no known injury.  EXAM: LUMBAR SPINE - COMPLETE 4+ VIEW  COMPARISON:  CT abdomen pelvis - 12/17/2014  FINDINGS: The lateral radiograph is degraded due to obliquity.  There are 6 non rib-bearing lumbar type vertebral bodies. For the purposes of this dictation, the 6 non rib-bearing lumbar type vertebral bodies will be labeled L1 through L6.  There is a mild scoliotic curvature of the thoracolumbar spine with dominant caudal component convex to the right, presumably degenerative in etiology. No anterolisthesis or retrolisthesis.  Lumbar vertebral body heights are grossly preserved given obliquity.  Moderate to severe multilevel lumbar spine DDD, worse at L4-L5 and L5-L6 with disc space height loss, endplate irregularity and sclerosis.  Limited visualization the bilateral SI joints and hips is normal.  Multiple phleboliths overlie the right lower hemipelvis. Vascular calcifications overlie the lower pelvis.  Regional bowel  gas pattern appears normal.  IMPRESSION: 1. Transitional anatomy with spinal labeling as above. 2. No acute findings. 3.  Moderate to severe multilevel lumbar spine DDD.   Electronically Signed   By: Sandi Mariscal M.D.   On: 02/10/2015 11:51   Mr Lumbar Spine Wo Contrast  02/10/2015   CLINICAL DATA:  Low back pain.  Lumbar radiculopathy, acute  EXAM: MRI LUMBAR SPINE WITHOUT CONTRAST  TECHNIQUE: Multiplanar, multisequence MR imaging of the lumbar spine was performed. No intravenous contrast was administered.  COMPARISON:  CT abdomen pelvis.  No prior MRI for comparison.  FINDINGS: Heterogeneous bone marrow signal diffusely. Focal T2 hyperintensities in the posterior vertebral bodies at T11, T12, and L1. These are mixed intermediate and high signal intensity on T1 and show sclerosis on the prior CT. No bony destruction on x-ray. These are probably benign lesions such is hemangiomata although not typical. No fracture is identified. Conus medullaris normal and terminates at L1-2.  Bilateral renal cysts are noted.  T12-L1:  Mild facet degeneration without spinal stenosis.  L1-2: Disc bulging and facet hypertrophy.  Mild spinal stenosis.  L2-3: Diffuse bulging of the disc. Bilateral facet and ligamentum flavum hypertrophy causing moderate spinal stenosis. Mild foraminal narrowing bilaterally.  L3-4: Diffuse bulging of the disc. Moderate facet and ligamentum flavum hypertrophy with moderate to severe spinal stenosis. Mild foraminal narrowing on the left  L4-5: 5 mm anterior slip. Moderate disc degeneration. Advanced facet hypertrophy bilaterally causing severe spinal stenosis. Subarticular stenosis bilaterally. Neural foramina are adequately patent  L5-S1: 3 mm anterior slip. Advanced disc degeneration with mild spurring. Bilateral facet hypertrophy. Moderate spinal stenosis and moderate foraminal stenosis bilaterally. There is edema in the endplates at R0-Q7 related to disc degeneration. There is edema extending into the  pedicles bilaterally most likely due to disc and facet degeneration. There is edema extending into the paraspinous muscles bilaterally left greater than right which may be due to muscle strain. There is atrophy of the paraspinous muscles bilaterally without fluid collection or mass lesion.  IMPRESSION: Diffusely heterogeneous bone marrow signal with T2 bright lesions involving T11, T12, and L1. These are indeterminate but probably benign, possibly atypical hemangiomata.  Moderate spinal stenosis L2-3  Moderate to severe spinal stenosis L3-4  Severe spinal stenosis L4-5  Spinal and foraminal stenosis bilaterally L5-S1. There is advanced disc degeneration at L5-S1 with endplate edema. There is bone marrow edema extending into the pedicles. There is edema in the paraspinous muscles at this level most likely related to facet degeneration. If the patient has fever or elevated white count, infection could be considered.   Electronically Signed   By: Franchot Gallo M.D.   On: 02/10/2015 13:19    Medications: I have reviewed the patient's current medications.  Assesment:   Principal Problem:   Intractable back pain Active Problems:   Nonischemic cardiomyopathy   Hypertension   Hypothyroidism   Anemia   Acute on chronic renal failure   Lumbar radiculopathy, acute    Plan:  Medications reviewed Continue current pain medications Will do physical therapy to help her ambulate      [name] 02/11/2015, 7:55 AM

## 2015-02-11 NOTE — Evaluation (Signed)
Occupational Therapy Evaluation Patient Details Name: [name] MRN: [id number] DOB: [birth year] Today's Date: 02/11/2015    History of Present Illness Patient is an 81 y/o female who reports intermittent mild low back pain since January of this year. She reports 5 days ago she was on her hands and knees in the garden planting flowers and rearranging small ground cover rocks. Daughter reports noticing that patient was "moving a little slower over the weekend". Sunday morning when patient was getting out of the bathtub he noticed "a black mark on my backside". She proceeded to "twist and turn looking in the mirror trying to identify this Loraine Leriche. Daughter reports black mark was a tic which she removed with alcohol. She states that tick was not very deep". During the day on Sunday daughter reports patient continued to move slowly upon getting up Monday morning patient had difficulty getting out of bed due to low back pain. Patient describes the pain as constant worse with movement and weightbearing located across her lumbar spine radiating to her right but are down her right thigh. This pain progressed reducing her mobility to the point she couldn't get out of bed at all today. Daughter states that she assisted patient to a standing position and weightbearing increases the pain. Standing up straight seemed to increase the pain as well. Daughter does not report any lower extremity weakness. Patient denies numbness or tingling of her lower extremities.    Clinical Impression   PTA pt lived at home with daughter who assists with B/IADL tasks as necessary. Pt finishing breakfast and declined to sit at EOB. Pt awake, alert, and oriented x3 this am. Pt demonstrates good BUE range of motion (80%) and strength (4/5). Pt reports her daughter, and at times granddaughters, assist her in donning LB clothing, with bathing tasks, and in fixing her hair. Pt reports no pain this am. Pt appears to be at baseline with  BADL tasks, no further OT services required.     Follow Up Recommendations  No OT follow up;Supervision/Assistance - 24 hour    Equipment Recommendations  None recommended by OT       Precautions / Restrictions Precautions Precautions: Fall Restrictions Weight Bearing Restrictions: No              ADL Overall ADL's : Needs assistance/impaired;At baseline Eating/Feeding: Modified independent                   Lower Body Dressing: Moderate assistance;Sitting/lateral leans (per pt report)               Functional mobility during ADLs: Rolling walker (per pt report) General ADL Comments: Per pt report daughter assists with BADLs as necessary. When pt daughter takes trips a granddaughter comes to stay with pt.      Vision Vision Assessment?: Yes Eye Alignment: Within Functional Limits Ocular Range of Motion: Within Functional Limits Alignment/Gaze Preference: Within Defined Limits Tracking/Visual Pursuits: Able to track stimulus in all quads without difficulty Saccades: Within functional limits Convergence: Within functional limits Visual Fields: No apparent deficits          Pertinent Vitals/Pain Pain Assessment: No/denies pain     Hand Dominance Right   Extremity/Trunk Assessment Upper Extremity Assessment Upper Extremity Assessment: Generalized weakness (4/5)   Lower Extremity Assessment Lower Extremity Assessment: Defer to PT evaluation       Communication Communication Communication: No difficulties   Cognition Arousal/Alertness: Awake/alert Behavior During Therapy: WFL for tasks assessed/performed Overall Cognitive Status: Within  Functional Limits for tasks assessed                                Home Living Family/patient expects to be discharged to:: Private residence Living Arrangements: Children Available Help at Discharge: Family;Available PRN/intermittently Type of Home: House             Bathroom Shower/Tub:  Chief Strategy Officer: Standard     Home Equipment: Environmental consultant - 2 wheels;Cane - single point;Bedside commode;Shower seat;Grab bars - tub/shower          Prior Functioning/Environment Level of Independence: Needs assistance    ADL's / Homemaking Assistance Needed: Pt reports daughter assists with tasks including bathing & dressing         End of Session    Activity Tolerance: Patient tolerated treatment well Patient left: in bed;with call bell/phone within reach;with bed alarm set   Time: 0827-0852 OT Time Calculation (min): 25 min Charges:  OT General Charges $OT Visit: 1 Procedure OT Evaluation $Initial OT Evaluation Tier I: 1 Procedure G-Codes: OT G-codes **NOT FOR INPATIENT CLASS** Functional Assessment Tool Used: Clinical judgement Functional Limitation: Self care Self Care Current Status (Z6109): At least 40 percent but less than 60 percent impaired, limited or restricted Self Care Goal Status (U0454): At least 40 percent but less than 60 percent impaired, limited or restricted Self Care Discharge Status [phone]): At least 40 percent but less than 60 percent impaired, limited or restricted   Ezra Sites, OTR/L  [phone]  02/11/2015, 9:41 AM

## 2015-02-12 MED ORDER — FUROSEMIDE 20 MG PO TABS: 20.0000 mg | ORAL_TABLET | Freq: Every day | ORAL | Status: DC

## 2015-02-12 MED ORDER — LORAZEPAM 2 MG/ML IJ SOLN
1.0000 mg | INTRAMUSCULAR | Status: AC
  Administered 2015-02-12: 1 mg via INTRAVENOUS
  Filled 2015-02-12: qty 1

## 2015-02-12 MED ORDER — HALOPERIDOL LACTATE 5 MG/ML IJ SOLN
1.0000 mg | INTRAMUSCULAR | Status: AC
  Administered 2015-02-12: 1 mg via INTRAMUSCULAR
  Filled 2015-02-12: qty 1

## 2015-02-12 MED ORDER — LORAZEPAM 2 MG/ML IJ SOLN
INTRAMUSCULAR | Status: AC
  Filled 2015-02-12: qty 1

## 2015-02-12 NOTE — Evaluation (Addendum)
Physical Therapy Evaluation Patient Details Name: [name] [name] MRN: [id number] DOB: November 04, 1924 Today's Date: 02/11/2015   History of Present Illness  Patient is an 81 y/o female who reports intermittent mild low back pain since January of this year. She reports 5 days ago she was on her hands and knees in the garden planting flowers and rearranging small ground cover rocks. Daughter reports noticing that patient was "moving a little slower over the weekend". Sunday morning when patient was getting out of the bathtub he noticed "a black mark on my backside". She proceeded to "twist and turn looking in the mirror trying to identify this Loraine Leriche. Daughter reports black mark was a tic which she removed with alcohol. She states that tick was not very deep". During the day on Sunday daughter reports patient continued to move slowly upon getting up Monday morning patient had difficulty getting out of bed due to low back pain. Patient describes the pain as constant worse with movement and weightbearing located across her lumbar spine radiating to her right but are down her right thigh. This pain progressed reducing her mobility to the point she couldn't get out of bed at all today. Daughter states that she assisted patient to a standing position and weightbearing increases the pain. Standing up straight seemed to increase the pain as well. Daughter does not report any lower extremity weakness. Patient denies numbness or tingling of her lower extremities.   Clinical Impression    Pt was seen for evaluation.  She was alert and oriented, reported no back pain at rest.  She does c/o feeling weak, particularly in her LEs.  She does demonstrate generalized deconditioning and had difficulty with transfer in and out of bed.  She was instructed in body mechanics for protection of her back with transfers.  Her gait with a cane is slightly unstable so she was instructed in gait using a walker as this is also a good method to  decrease load on her lumbar spine.  We are recommending HHPT at d/c in which pt is in agreement.  Follow Up Recommendations   HHPT   Equipment Recommendations    none   Recommendations for Other Services   none    Precautions / Restrictions   fall     Mobility  Bed Mobility    modified independent              Transfers    modified independent-   Pt instructed in log rolling technique                Ambulation/Gait Ambulation/Gait assistance: Modified independent (Device/Increase time) Ambulation Distance (Feet): 100 Feet Assistive device: Rolling walker (2 wheeled) Gait Pattern/deviations: WFL(Within Functional Limits);Trunk flexed   Gait velocity interpretation: Below normal speed for age/gender General Gait Details: pt is more stable using a walker in order to protect her back                      Balance Overall balance assessment: Modified Independent                                                                         Prior Function Level of Independence: Needs assistance  Gait / Transfers Assistance Needed: normally ambulates with a straight cane                    Extremity/Trunk Assessment    generalized LE weakness/deconditioning                     Communication    no difficulty  Cognition    alert and oriented                                      Assessment/Plan    PT Assessment    PT Diagnosis   generalized weakness  PT Problem List    PT Treatment Interventions     PT Goals (Current goals can be found in the Care Plan section)  goals to be established in next venue of care    Frequency     Barriers to discharge  none      Co-evaluation               End of Session                 Time:  - 10:11- 10:49     Charges: 1 PT visit                    Initial PT evaluation- Tier 1       PT G Codes:        [name] [name]  [name]  PT 02/11/2015,10:50 AM

## 2015-02-12 NOTE — Progress Notes (Signed)
Pt was having trouble sleeping and was given trazodone to help her sleep. Around 0300 pt became very aggressive and combative. She was hitting and biting staff. On call MD was notified and an order to give IV ativan was given. Due to her jerking at IV, access was lost. Multiple attempts were made to restart IV, but were unsuccessful  . MD was notified and instructed to give IM. One hour later medication was still unefective and patient was still trying to hit staff . MD notified and instructed to give Haldol. Pt is still yelling and trying to get out of the bed, sitter is currently at bedside.

## 2015-02-12 NOTE — Care Management Note (Signed)
Case Management Note  Patient Details  Name: [name] MRN: [id number] Date of Birth: 13-Mar-1925  Subjective/Objective:                    Action/Plan:   Expected Discharge Date:  02/13/15               Expected Discharge Plan:  Home w Home Health Services  In-House Referral:  NA  Discharge planning Services  CM Consult  Post Acute Care Choice:  Home Health Choice offered to:  Adult Children  DME Arranged:    DME Agency:     HH Arranged:  PT HH Agency:  [facility]  Status of Service:  Completed, signed off  Medicare Important Message Given:    Date Medicare IM Given:    Medicare IM give by:    Date Additional Medicare IM Given:    Additional Medicare Important Message give by:     If discussed at Long Length of Stay Meetings, dates discussed:    Additional Comments: Pt discharged home today with [facility] PT (per pts daughter choice). [name] of [facility] is aware and will collect the pts information from the chart. HH services to start within 48 hours of discharge. No DME needs noted. Pts daughter and pts nurse aware of discharge arrangements. [name] [place], RN 02/12/2015, 9:42 AM

## 2015-03-11 NOTE — Discharge Summary (Signed)
Physician Discharge Summary  Patient ID: [name] MRN: [id number] DOB/AGE: [birth year] 81 y.o. Primary Care Physician:Afreen Siebels, MD Admit date: 02/10/2015 Discharge date: 02/13/15   Discharge Diagnoses:   Principal Problem:   Intractable back pain Active Problems:   Nonischemic cardiomyopathy   Hypertension   Hypothyroidism   Anemia   Acute on chronic renal failure   Lumbar radiculopathy, acute     Medication List    TAKE these medications        acetaminophen-codeine 300-30 MG per tablet  Commonly known as:  TYLENOL #3  Take 1 tablet by mouth 2 (two) times daily as needed for moderate pain.     ALPRAZolam 0.5 MG tablet  Commonly known as:  XANAX  Take 0.5 mg by mouth 2 (two) times daily.     carvedilol 25 MG tablet  Commonly known as:  COREG  Take 25 mg by mouth 2 (two) times daily with a meal.     furosemide 20 MG tablet  Commonly known as:  LASIX  Take 1 tablet (20 mg total) by mouth daily.     gabapentin 100 MG capsule  Commonly known as:  NEURONTIN  Take 100 mg by mouth 2 (two) times daily.     hydrALAZINE 50 MG tablet  Commonly known as:  APRESOLINE  Take 50 mg by mouth 2 (two) times daily.     levothyroxine 75 MCG tablet  Commonly known as:  SYNTHROID, LEVOTHROID  Take 75 mcg by mouth daily.     lisinopril 20 MG tablet  Commonly known as:  PRINIVIL,ZESTRIL  Take 20 mg by mouth daily.     meclizine 25 MG tablet  Commonly known as:  ANTIVERT  Take 25 mg by mouth 3 (three) times daily as needed for dizziness.     pantoprazole 40 MG tablet  Commonly known as:  PROTONIX  Take 1 tablet (40 mg total) by mouth 2 (two) times daily.     potassium chloride SA 20 MEQ tablet  Commonly known as:  K-DUR,KLOR-CON  Take 20 mEq by mouth daily.     vitamin B-12 1000 MCG tablet  Commonly known as:  CYANOCOBALAMIN  Take 1,000 mcg by mouth daily.        Discharged Condition; improved    Consults None  Significant Diagnostic Studies: Dg  Lumbar Spine Complete  02/10/2015   CLINICAL DATA:  Low back pain radiating to the right leg since working in the garden 3 weeks ago. Otherwise, no known injury.  EXAM: LUMBAR SPINE - COMPLETE 4+ VIEW  COMPARISON:  CT abdomen pelvis - 12/17/2014  FINDINGS: The lateral radiograph is degraded due to obliquity.  There are 6 non rib-bearing lumbar type vertebral bodies. For the purposes of this dictation, the 6 non rib-bearing lumbar type vertebral bodies will be labeled L1 through L6.  There is a mild scoliotic curvature of the thoracolumbar spine with dominant caudal component convex to the right, presumably degenerative in etiology. No anterolisthesis or retrolisthesis.  Lumbar vertebral body heights are grossly preserved given obliquity.  Moderate to severe multilevel lumbar spine DDD, worse at L4-L5 and L5-L6 with disc space height loss, endplate irregularity and sclerosis.  Limited visualization the bilateral SI joints and hips is normal.  Multiple phleboliths overlie the right lower hemipelvis. Vascular calcifications overlie the lower pelvis.  Regional bowel gas pattern appears normal.  IMPRESSION: 1. Transitional anatomy with spinal labeling as above. 2. No acute findings. 3. Moderate to severe multilevel lumbar spine DDD.   Electronically  Signed   By: Simonne Come M.D.   On: 02/10/2015 11:51   Mr Lumbar Spine Wo Contrast  02/10/2015   CLINICAL DATA:  Low back pain.  Lumbar radiculopathy, acute  EXAM: MRI LUMBAR SPINE WITHOUT CONTRAST  TECHNIQUE: Multiplanar, multisequence MR imaging of the lumbar spine was performed. No intravenous contrast was administered.  COMPARISON:  CT abdomen pelvis.  No prior MRI for comparison.  FINDINGS: Heterogeneous bone marrow signal diffusely. Focal T2 hyperintensities in the posterior vertebral bodies at T11, T12, and L1. These are mixed intermediate and high signal intensity on T1 and show sclerosis on the prior CT. No bony destruction on x-ray. These are probably benign  lesions such is hemangiomata although not typical. No fracture is identified. Conus medullaris normal and terminates at L1-2.  Bilateral renal cysts are noted.  T12-L1:  Mild facet degeneration without spinal stenosis.  L1-2: Disc bulging and facet hypertrophy.  Mild spinal stenosis.  L2-3: Diffuse bulging of the disc. Bilateral facet and ligamentum flavum hypertrophy causing moderate spinal stenosis. Mild foraminal narrowing bilaterally.  L3-4: Diffuse bulging of the disc. Moderate facet and ligamentum flavum hypertrophy with moderate to severe spinal stenosis. Mild foraminal narrowing on the left  L4-5: 5 mm anterior slip. Moderate disc degeneration. Advanced facet hypertrophy bilaterally causing severe spinal stenosis. Subarticular stenosis bilaterally. Neural foramina are adequately patent  L5-S1: 3 mm anterior slip. Advanced disc degeneration with mild spurring. Bilateral facet hypertrophy. Moderate spinal stenosis and moderate foraminal stenosis bilaterally. There is edema in the endplates at L5-S1 related to disc degeneration. There is edema extending into the pedicles bilaterally most likely due to disc and facet degeneration. There is edema extending into the paraspinous muscles bilaterally left greater than right which may be due to muscle strain. There is atrophy of the paraspinous muscles bilaterally without fluid collection or mass lesion.  IMPRESSION: Diffusely heterogeneous bone marrow signal with T2 bright lesions involving T11, T12, and L1. These are indeterminate but probably benign, possibly atypical hemangiomata.  Moderate spinal stenosis L2-3  Moderate to severe spinal stenosis L3-4  Severe spinal stenosis L4-5  Spinal and foraminal stenosis bilaterally L5-S1. There is advanced disc degeneration at L5-S1 with endplate edema. There is bone marrow edema extending into the pedicles. There is edema in the paraspinous muscles at this level most likely related to facet degeneration. If the patient has  fever or elevated white count, infection could be considered.   Electronically Signed   By: Marlan Palau M.D.   On: 02/10/2015 13:19    Lab Results: Basic Metabolic Panel: No results for input(s): NA, K, CL, CO2, GLUCOSE, BUN, CREATININE, CALCIUM, MG, PHOS in the last 72 hours. Liver Function Tests: No results for input(s): AST, ALT, ALKPHOS, BILITOT, PROT, ALBUMIN in the last 72 hours.   CBC: No results for input(s): WBC, NEUTROABS, HGB, HCT, MCV, PLT in the last 72 hours.  No results found for this or any previous visit (from the past 240 hour(s)).   Hospital Course:  This is an 81  years old female with history of multiple medical illnesses was admitted due to intractable back Pain. Patient received IV pain medications. Patient improved. She discharged home in stable condition to continue physical therapy at out patient. Discharge Exam: Blood pressure 152/40, pulse 98, temperature 97.5 F (36.4 C), temperature source Oral, resp. rate 18, height 5\' 4"  (1.626 m), weight 66.225 kg (146 lb), SpO2 100 %.   Disposition:  Home        Follow-up Information  Follow up with [facility].   Contact information:   [address] [place] [address] [phone]       Follow up with [facility], MD On 02/24/2015.   Specialty:  Internal Medicine   Why:  at 8:40 am   Contact information:   [address] [place] [address] [phone]       Signed: Avon Gully   03/11/2015, 1:53 PM

## 2015-07-14 DIAGNOSIS — E039 Hypothyroidism, unspecified: Secondary | ICD-10-CM | POA: Diagnosis not present

## 2015-07-14 DIAGNOSIS — I10 Essential (primary) hypertension: Secondary | ICD-10-CM | POA: Diagnosis not present

## 2015-07-14 DIAGNOSIS — M1990 Unspecified osteoarthritis, unspecified site: Secondary | ICD-10-CM | POA: Diagnosis not present

## 2015-07-14 DIAGNOSIS — I2510 Atherosclerotic heart disease of native coronary artery without angina pectoris: Secondary | ICD-10-CM | POA: Diagnosis not present

## 2015-07-14 DIAGNOSIS — R600 Localized edema: Secondary | ICD-10-CM | POA: Diagnosis not present

## 2015-07-14 DIAGNOSIS — E785 Hyperlipidemia, unspecified: Secondary | ICD-10-CM | POA: Diagnosis not present

## 2015-09-16 DIAGNOSIS — R109 Unspecified abdominal pain: Secondary | ICD-10-CM | POA: Diagnosis not present

## 2015-09-16 DIAGNOSIS — N3946 Mixed incontinence: Secondary | ICD-10-CM | POA: Diagnosis not present

## 2015-09-16 DIAGNOSIS — R3915 Urgency of urination: Secondary | ICD-10-CM | POA: Diagnosis not present

## 2015-09-22 DIAGNOSIS — I2510 Atherosclerotic heart disease of native coronary artery without angina pectoris: Secondary | ICD-10-CM | POA: Diagnosis not present

## 2015-09-22 DIAGNOSIS — M1990 Unspecified osteoarthritis, unspecified site: Secondary | ICD-10-CM | POA: Diagnosis not present

## 2015-09-22 DIAGNOSIS — E785 Hyperlipidemia, unspecified: Secondary | ICD-10-CM | POA: Diagnosis not present

## 2015-09-22 DIAGNOSIS — E039 Hypothyroidism, unspecified: Secondary | ICD-10-CM | POA: Diagnosis not present

## 2015-09-22 DIAGNOSIS — I10 Essential (primary) hypertension: Secondary | ICD-10-CM | POA: Diagnosis not present

## 2015-09-22 DIAGNOSIS — R600 Localized edema: Secondary | ICD-10-CM | POA: Diagnosis not present

## 2015-09-30 DIAGNOSIS — N3946 Mixed incontinence: Secondary | ICD-10-CM | POA: Diagnosis not present

## 2015-09-30 DIAGNOSIS — R3915 Urgency of urination: Secondary | ICD-10-CM | POA: Diagnosis not present

## 2015-09-30 DIAGNOSIS — R109 Unspecified abdominal pain: Secondary | ICD-10-CM | POA: Diagnosis not present

## 2015-11-01 DIAGNOSIS — R3915 Urgency of urination: Secondary | ICD-10-CM | POA: Diagnosis not present

## 2015-11-01 DIAGNOSIS — N3946 Mixed incontinence: Secondary | ICD-10-CM | POA: Diagnosis not present

## 2015-12-29 DIAGNOSIS — R600 Localized edema: Secondary | ICD-10-CM | POA: Diagnosis not present

## 2015-12-29 DIAGNOSIS — M1990 Unspecified osteoarthritis, unspecified site: Secondary | ICD-10-CM | POA: Diagnosis not present

## 2015-12-29 DIAGNOSIS — I10 Essential (primary) hypertension: Secondary | ICD-10-CM | POA: Diagnosis not present

## 2015-12-29 DIAGNOSIS — I2510 Atherosclerotic heart disease of native coronary artery without angina pectoris: Secondary | ICD-10-CM | POA: Diagnosis not present

## 2016-02-14 ENCOUNTER — Emergency Department (HOSPITAL_COMMUNITY): Payer: Medicare HMO

## 2016-02-14 ENCOUNTER — Emergency Department (HOSPITAL_COMMUNITY)
Admission: EM | Admit: 2016-02-14 | Discharge: 2016-02-15 | Disposition: A | Discharge status: Home / Self Care | Payer: Medicare HMO | Attending: Emergency Medicine | Admitting: Emergency Medicine

## 2016-02-14 ENCOUNTER — Encounter (HOSPITAL_COMMUNITY): Payer: Self-pay | Admitting: Emergency Medicine

## 2016-02-14 DIAGNOSIS — R202 Paresthesia of skin: Secondary | ICD-10-CM | POA: Diagnosis not present

## 2016-02-14 DIAGNOSIS — N289 Disorder of kidney and ureter, unspecified: Secondary | ICD-10-CM

## 2016-02-14 DIAGNOSIS — D649 Anemia, unspecified: Secondary | ICD-10-CM | POA: Diagnosis not present

## 2016-02-14 DIAGNOSIS — M79641 Pain in right hand: Secondary | ICD-10-CM | POA: Diagnosis not present

## 2016-02-14 DIAGNOSIS — W19XXXA Unspecified fall, initial encounter: Secondary | ICD-10-CM

## 2016-02-14 DIAGNOSIS — I129 Hypertensive chronic kidney disease with stage 1 through stage 4 chronic kidney disease, or unspecified chronic kidney disease: Secondary | ICD-10-CM | POA: Insufficient documentation

## 2016-02-14 DIAGNOSIS — M25551 Pain in right hip: Secondary | ICD-10-CM | POA: Diagnosis not present

## 2016-02-14 DIAGNOSIS — N189 Chronic kidney disease, unspecified: Secondary | ICD-10-CM | POA: Diagnosis not present

## 2016-02-14 DIAGNOSIS — N390 Urinary tract infection, site not specified: Secondary | ICD-10-CM | POA: Insufficient documentation

## 2016-02-14 DIAGNOSIS — M25552 Pain in left hip: Secondary | ICD-10-CM | POA: Diagnosis not present

## 2016-02-14 DIAGNOSIS — Y92009 Unspecified place in unspecified non-institutional (private) residence as the place of occurrence of the external cause: Secondary | ICD-10-CM | POA: Diagnosis not present

## 2016-02-14 DIAGNOSIS — N2889 Other specified disorders of kidney and ureter: Secondary | ICD-10-CM | POA: Diagnosis not present

## 2016-02-14 DIAGNOSIS — Z79899 Other long term (current) drug therapy: Secondary | ICD-10-CM | POA: Diagnosis not present

## 2016-02-14 DIAGNOSIS — Y999 Unspecified external cause status: Secondary | ICD-10-CM | POA: Diagnosis not present

## 2016-02-14 DIAGNOSIS — E039 Hypothyroidism, unspecified: Secondary | ICD-10-CM | POA: Insufficient documentation

## 2016-02-14 DIAGNOSIS — I639 Cerebral infarction, unspecified: Secondary | ICD-10-CM | POA: Insufficient documentation

## 2016-02-14 DIAGNOSIS — S6991XA Unspecified injury of right wrist, hand and finger(s), initial encounter: Secondary | ICD-10-CM | POA: Diagnosis not present

## 2016-02-14 DIAGNOSIS — Y939 Activity, unspecified: Secondary | ICD-10-CM | POA: Insufficient documentation

## 2016-02-14 DIAGNOSIS — W138XXA Fall from, out of or through other building or structure, initial encounter: Secondary | ICD-10-CM | POA: Diagnosis not present

## 2016-02-14 LAB — CBC WITH DIFFERENTIAL/PLATELET
BASOS ABS: 0 10*3/uL (ref 0.0–0.1)
Basophils Relative: 0 %
EOS ABS: 0.1 10*3/uL (ref 0.0–0.7)
Eosinophils Relative: 2 %
HCT: 31.4 % — ABNORMAL LOW (ref 36.0–46.0)
Hemoglobin: 10.2 g/dL — ABNORMAL LOW (ref 12.0–15.0)
LYMPHS ABS: 1.9 10*3/uL (ref 0.7–4.0)
Lymphocytes Relative: 26 %
MCH: 29.4 pg (ref 26.0–34.0)
MCHC: 32.5 g/dL (ref 30.0–36.0)
MCV: 90.5 fL (ref 78.0–100.0)
Monocytes Absolute: 0.4 10*3/uL (ref 0.1–1.0)
Monocytes Relative: 6 %
Neutro Abs: 5 10*3/uL (ref 1.7–7.7)
Neutrophils Relative %: 66 %
Platelets: 191 10*3/uL (ref 150–400)
RBC: 3.47 MIL/uL — AB (ref 3.87–5.11)
RDW: 13.4 % (ref 11.5–15.5)
WBC: 7.4 10*3/uL (ref 4.0–10.5)

## 2016-02-14 LAB — BASIC METABOLIC PANEL
Anion gap: 8 (ref 5–15)
BUN: 34 mg/dL — ABNORMAL HIGH (ref 6–20)
CHLORIDE: 108 mmol/L (ref 101–111)
CO2: 26 mmol/L (ref 22–32)
Calcium: 9 mg/dL (ref 8.9–10.3)
Creatinine, Ser: 1.26 mg/dL — ABNORMAL HIGH (ref 0.44–1.00)
GFR calc Af Amer: 42 mL/min — ABNORMAL LOW (ref >60)
GFR calc non Af Amer: 37 mL/min — ABNORMAL LOW (ref >60)
Glucose, Bld: 103 mg/dL — ABNORMAL HIGH (ref 65–99)
POTASSIUM: 3.5 mmol/L (ref 3.5–5.1)
SODIUM: 142 mmol/L (ref 135–145)

## 2016-02-14 MED ORDER — OXYCODONE-ACETAMINOPHEN 5-325 MG PO TABS
1.0000 | ORAL_TABLET | Freq: Once | ORAL | Status: AC
  Administered 2016-02-14: 1 via ORAL
  Filled 2016-02-14: qty 1

## 2016-02-14 NOTE — ED Notes (Addendum)
Pt reports was using walker on Saturday and reports walker turned wrong and reports pt slid down the wall. Pt reports bilateral leg and hip pain, right hand and arm pain. Pt denies hitting head or loc. nad noted. No rotation or shortening. Pt family reports pt BLE swelling x1 month. Right arm tingling x1 week. Pt denies cp,sob. Pt denies being on blood thinners.

## 2016-02-14 NOTE — ED Notes (Signed)
Patient able to void using bedpan.

## 2016-02-14 NOTE — ED Provider Notes (Signed)
CSN: [id number]     Arrival date & time 02/14/16  1752 History  By signing my name below, I, Vista Mink, attest that this documentation has been prepared under the direction and in the presence of Dione Booze, MD. Electronically signed, Vista Mink, ED Scribe. 02/14/2016. 11:23 PM.  Chief Complaint  Patient presents with  . Fall   The history is provided by the patient. No language interpreter was used.   HPI Comments: [name] is a 82 y.o. female with a PMHx of CVA who presents to the Emergency Department complaining of constant, gradually worsening hip pain s/p a fall that occurred two days ago. Pt states she lost her balance, slid down a wall and fell onto her hip. Pt was seen in the ED after the fall and XR showed no fractures. Pt reports associated symptoms of dysuria  Pt also reports weakness and tingling in her right hand that started three weeks ago. Pt has been taking Tylenol at home with mild alleviation but has not taken any PTA.   Past Medical History  Diagnosis Date  . Cardiomyopathy     Nonischemic, EF 25-30% 2007 / EF improved 50% 2008, ( catheterization not done) nuclear 2007, fixed anteroapical defect, no ischemia  . Ejection fraction     EF 50%, echo, 2008  . Hypertension   . Pulmonary hypertension (HCC)   . ASD (atrial septal defect)     Small  . Edema     Chronic lower extremity edema... venous insufficiency  . Hypothyroidism   . Stroke (HCC)   . Diverticulitis   . Gout   . CKD (chronic kidney disease)   . Chronic back pain greater than 3 months duration   . Anemia   . Multilevel degenerative disc disease    Past Surgical History  Procedure Laterality Date  . Abdominal hysterectomy    . Breast surgery     Family History  Problem Relation Age of Onset  . Coronary artery disease      POSITIVE HX IN FAMILY   Social History  Substance Use Topics  . Smoking status: Never Smoker   . Smokeless tobacco: Never Used  . Alcohol Use: No   OB History     No data available     Review of Systems  Genitourinary: Positive for dysuria.  Musculoskeletal: Positive for arthralgias (Hip).  All other systems reviewed and are negative.     Allergies  Aspirin  Home Medications   Prior to Admission medications   Medication Sig Start Date End Date Taking? Authorizing Provider  acetaminophen-codeine (TYLENOL #3) 300-30 MG per tablet Take 1 tablet by mouth 2 (two) times daily as needed for moderate pain.    Historical Provider, MD  ALPRAZolam Prudy Feeler) 0.5 MG tablet Take 0.5 mg by mouth 2 (two) times daily.    Historical Provider, MD  carvedilol (COREG) 25 MG tablet Take 25 mg by mouth 2 (two) times daily with a meal.     Historical Provider, MD  furosemide (LASIX) 20 MG tablet Take 1 tablet (20 mg total) by mouth daily. 02/12/15   Avon Gully, MD  gabapentin (NEURONTIN) 100 MG capsule Take 100 mg by mouth 2 (two) times daily.  12/09/14   Historical Provider, MD  hydrALAZINE (APRESOLINE) 50 MG tablet Take 50 mg by mouth 2 (two) times daily.    Historical Provider, MD  levothyroxine (SYNTHROID, LEVOTHROID) 75 MCG tablet Take 75 mcg by mouth daily.      Historical Provider, MD  lisinopril (PRINIVIL,ZESTRIL) 20 MG tablet Take 20 mg by mouth daily.      Historical Provider, MD  meclizine (ANTIVERT) 25 MG tablet Take 25 mg by mouth 3 (three) times daily as needed for dizziness.  06/13/13   Historical Provider, MD  pantoprazole (PROTONIX) 40 MG tablet Take 1 tablet (40 mg total) by mouth 2 (two) times daily. 09/12/14   Avon Gully, MD  potassium chloride SA (K-DUR,KLOR-CON) 20 MEQ tablet Take 20 mEq by mouth daily.  12/09/14   Historical Provider, MD  vitamin B-12 (CYANOCOBALAMIN) 1000 MCG tablet Take 1,000 mcg by mouth daily.    Historical Provider, MD   BP 169/84 mmHg  Pulse 64  Temp(Src) 98.5 F (36.9 C) (Oral)  Resp 18  Ht  (1.626 m)  Wt 140 lb (63.504 kg)  BMI 24.02 kg/m2  SpO2 100% Physical Exam  Constitutional: She is oriented to person,  place, and time. She appears well-developed and well-nourished. No distress.  HENT:  Head: Normocephalic and atraumatic.  Eyes: EOM are normal. Pupils are equal, round, and reactive to light.  Neck: Normal range of motion. Neck supple. No JVD present.  Cardiovascular: Normal rate and normal heart sounds.   No murmur heard. Pulmonary/Chest: Effort normal and breath sounds normal. She has no wheezes. She has no rales. She exhibits no tenderness.  Abdominal: Soft. Bowel sounds are normal. She exhibits no distension and no mass. There is no tenderness.  Musculoskeletal: Normal range of motion. She exhibits edema.  Mild left CVA tenderness Pain on passive ROM of hips but no tenderness to palpation 2+ pitting edema   Lymphadenopathy:    She has no cervical adenopathy.  Neurological: She is alert and oriented to person, place, and time. No cranial nerve deficit. She exhibits normal muscle tone. Coordination normal.  Skin: Skin is warm and dry. No rash noted. She is not diaphoretic.  Psychiatric: She has a normal mood and affect. Her behavior is normal. Judgment and thought content normal.  Nursing note and vitals reviewed.   ED Course  Procedures  DIAGNOSTIC STUDIES: Oxygen Saturation is 100% on RA, normal by my interpretation.  COORDINATION OF CARE: 10:59 PM-Will order urinalysis, imaging and pain medication. Discussed treatment plan with pt at bedside and pt agreed to plan.   Labs Review Results for orders placed or performed during the hospital encounter of 02/14/16  Urinalysis, Routine w reflex microscopic  Result Value Ref Range   Color, Urine YELLOW YELLOW   APPearance CLEAR CLEAR   Specific Gravity, Urine 1.010 1.005 - 1.030   pH 6.0 5.0 - 8.0   Glucose, UA NEGATIVE NEGATIVE mg/dL   Hgb urine dipstick TRACE (A) NEGATIVE   Bilirubin Urine NEGATIVE NEGATIVE   Ketones, ur NEGATIVE NEGATIVE mg/dL   Protein, ur NEGATIVE NEGATIVE mg/dL   Nitrite NEGATIVE NEGATIVE   Leukocytes,  UA NEGATIVE NEGATIVE  Basic metabolic panel  Result Value Ref Range   Sodium 142 135 - 145 mmol/L   Potassium 3.5 3.5 - 5.1 mmol/L   Chloride 108 101 - 111 mmol/L   CO2 26 22 - 32 mmol/L   Glucose, Bld 103 (H) 65 - 99 mg/dL   BUN 34 (H) 6 - 20 mg/dL   Creatinine, Ser 1.61 (H) 0.44 - 1.00 mg/dL   Calcium 9.0 8.9 - 09.6 mg/dL   GFR calc non Af Amer 37 (L) >60 mL/min   GFR calc Af Amer 42 (L) >60 mL/min   Anion gap 8 5 - 15  CBC  with Differential  Result Value Ref Range   WBC 7.4 4.0 - 10.5 K/uL   RBC 3.47 (L) 3.87 - 5.11 MIL/uL   Hemoglobin 10.2 (L) 12.0 - 15.0 g/dL   HCT 16.1 (L) 09.6 - 04.5 %   MCV 90.5 78.0 - 100.0 fL   MCH 29.4 26.0 - 34.0 pg   MCHC 32.5 30.0 - 36.0 g/dL   RDW 40.9 81.1 - 91.4 %   Platelets 191 150 - 400 K/uL   Neutrophils Relative % 66 %   Neutro Abs 5.0 1.7 - 7.7 K/uL   Lymphocytes Relative 26 %   Lymphs Abs 1.9 0.7 - 4.0 K/uL   Monocytes Relative 6 %   Monocytes Absolute 0.4 0.1 - 1.0 K/uL   Eosinophils Relative 2 %   Eosinophils Absolute 0.1 0.0 - 0.7 K/uL   Basophils Relative 0 %   Basophils Absolute 0.0 0.0 - 0.1 K/uL  Urine microscopic-add on  Result Value Ref Range   Squamous Epithelial / LPF 6-30 (A) NONE SEEN   WBC, UA 0-5 0 - 5 WBC/hpf   RBC / HPF 0-5 0 - 5 RBC/hpf   Bacteria, UA MANY (A) NONE SEEN    Imaging Review Ct Head Wo Contrast  02/15/2016  CLINICAL DATA:  Acute onset of right hand tingling and generalized weakness. Loss of balance. Initial encounter. EXAM: CT HEAD WITHOUT CONTRAST TECHNIQUE: Contiguous axial images were obtained from the base of the skull through the vertex without intravenous contrast. COMPARISON:  CT of the head performed 09/01/2014 FINDINGS: There is no evidence of acute infarction, mass lesion, or intra- or extra-axial hemorrhage on CT. Prominence of the sulci suggests mild cortical volume loss. Mild periventricular white matter change likely reflects small vessel ischemic microangiopathy. A small chronic lacunar  infarct is noted at the left basal ganglia. The brainstem and fourth ventricle are within normal limits. The cerebral hemispheres demonstrate grossly normal gray-white differentiation. No mass effect or midline shift is seen. There is no evidence of fracture; visualized osseous structures are unremarkable in appearance. The orbits are within normal limits. The paranasal sinuses and mastoid air cells are well-aerated. No significant soft tissue abnormalities are seen. IMPRESSION: 1. No acute intracranial pathology seen on CT. 2. Mild cortical volume loss and scattered small vessel ischemic microangiopathy. 3. Small chronic lacunar infarct at the left basal ganglia. Electronically Signed   By: Roanna Raider M.D.   On: 02/15/2016 01:11   Dg Hand Complete Right  02/14/2016  CLINICAL DATA:  Status post fall, with right hand pain. Initial encounter. EXAM: RIGHT HAND - COMPLETE 3+ VIEW COMPARISON:  None. FINDINGS: There is no evidence of fracture or dislocation. 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 The carpal rows are intact, and demonstrate normal alignment. The soft tissues are unremarkable in appearance. IMPRESSION: No evidence of fracture or dislocation. 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 Electronically Signed   By: Roanna Raider M.D.   On: 02/14/2016 18:48   Dg Hips Bilat With Pelvis 2v  02/14/2016  CLINICAL DATA:  Fall last Saturday with bilateral hip pain EXAM: DG HIP (WITH OR WITHOUT PELVIS) 2V BILAT COMPARISON:  None. FINDINGS: Frontal view of the pelvis shows demineralization without fracture. SI joints and symphysis pubis are unremarkable. AP and frog-leg lateral views of the right hip show no fracture. Degenerative changes are noted in the acetabulum. AP frog-leg lateral views of left hip are also without fracture evident. Degenerative acetabular changes noted. IMPRESSION: Negative. Electronically Signed  By: Kennith Center M.D.   On: 02/14/2016 18:53   I  have personally reviewed and evaluated these images and lab results as part of my medical decision-making.   MDM   Final diagnoses:  Bilateral hip pain  Urinary tract infection without hematuria, site unspecified  Normochromic normocytic anemia  Renal insufficiency  Fall at home, initial encounter    Complaints of bilateral hip pain and difficulty ambulating. Exam shows no significant abnormality. X-rays show no significant abnormality. She did hit her head, so she is sent for CT of the head which was unremarkable. Screening labs are obtained showing chronic renal insufficiency and mild anemia which are unchanged from baseline. Urinalysis does have many bacteria so she will be treated for urinary tract infection. Average range the family that this may or may not give her some improvement. It seems that she is approaching the point where family may need to consider long-term care facility. I have discussed this with them as well as the possibility of just getting additional home health services. She has a follow-up appointment with her PCP established for June 20 and she is to keep that appointment. She is discharged with prescription for cephalexin. Old records were reviewed, but she has no relevant past visits.  I personally performed the services described in this documentation, which was scribed in my presence. The recorded information has been reviewed and is accurate.     Dione Booze, MD 02/15/16 [phone]

## 2016-02-15 ENCOUNTER — Emergency Department (HOSPITAL_COMMUNITY): Payer: Medicare HMO

## 2016-02-15 DIAGNOSIS — R202 Paresthesia of skin: Secondary | ICD-10-CM | POA: Diagnosis not present

## 2016-02-15 DIAGNOSIS — E039 Hypothyroidism, unspecified: Secondary | ICD-10-CM | POA: Diagnosis not present

## 2016-02-15 DIAGNOSIS — D649 Anemia, unspecified: Secondary | ICD-10-CM | POA: Diagnosis not present

## 2016-02-15 DIAGNOSIS — W138XXA Fall from, out of or through other building or structure, initial encounter: Secondary | ICD-10-CM | POA: Diagnosis not present

## 2016-02-15 DIAGNOSIS — I129 Hypertensive chronic kidney disease with stage 1 through stage 4 chronic kidney disease, or unspecified chronic kidney disease: Secondary | ICD-10-CM | POA: Diagnosis not present

## 2016-02-15 DIAGNOSIS — N390 Urinary tract infection, site not specified: Secondary | ICD-10-CM | POA: Diagnosis not present

## 2016-02-15 DIAGNOSIS — I639 Cerebral infarction, unspecified: Secondary | ICD-10-CM | POA: Diagnosis not present

## 2016-02-15 DIAGNOSIS — M25551 Pain in right hip: Secondary | ICD-10-CM | POA: Diagnosis not present

## 2016-02-15 DIAGNOSIS — M25552 Pain in left hip: Secondary | ICD-10-CM | POA: Diagnosis not present

## 2016-02-15 DIAGNOSIS — N189 Chronic kidney disease, unspecified: Secondary | ICD-10-CM | POA: Diagnosis not present

## 2016-02-15 DIAGNOSIS — Z79899 Other long term (current) drug therapy: Secondary | ICD-10-CM | POA: Diagnosis not present

## 2016-02-15 LAB — URINALYSIS, ROUTINE W REFLEX MICROSCOPIC
BILIRUBIN URINE: NEGATIVE
Glucose, UA: NEGATIVE mg/dL
KETONES UR: NEGATIVE mg/dL
Leukocytes, UA: NEGATIVE
Nitrite: NEGATIVE
PH: 6 (ref 5.0–8.0)
Protein, ur: NEGATIVE mg/dL
Specific Gravity, Urine: 1.01 (ref 1.005–1.030)

## 2016-02-15 LAB — URINE MICROSCOPIC-ADD ON

## 2016-02-15 MED ORDER — CEPHALEXIN 500 MG PO CAPS
500.0000 mg | ORAL_CAPSULE | Freq: Once | ORAL | Status: AC
  Administered 2016-02-15: 500 mg via ORAL
  Filled 2016-02-15: qty 1

## 2016-02-15 MED ORDER — CEPHALEXIN 500 MG PO CAPS: 500.0000 mg | ORAL_CAPSULE | Freq: Three times a day (TID) | ORAL | Status: DC

## 2016-02-15 NOTE — Discharge Instructions (Signed)
Your evaluation today did not show anything new except for presence of a urinary tract infection. UA getting antibiotics for the infection, but that may or may not improve your hip pain and ability to walk. Please follow-up with your primary care provider. If you need home health services, that needs to be set up through him.  Urinary Tract Infection Urinary tract infections (UTIs) can develop anywhere along your urinary tract. Your urinary tract is your body's drainage system for removing wastes and extra water. Your urinary tract includes two kidneys, two ureters, a bladder, and a urethra. Your kidneys are a pair of bean-shaped organs. Each kidney is about the size of your fist. They are located below your ribs, one on each side of your spine. CAUSES Infections are caused by microbes, which are microscopic organisms, including fungi, viruses, and bacteria. These organisms are so small that they can only be seen through a microscope. Bacteria are the microbes that most commonly cause UTIs. SYMPTOMS  Symptoms of UTIs may vary by age and gender of the patient and by the location of the infection. Symptoms in young women typically include a frequent and intense urge to urinate and a painful, burning feeling in the bladder or urethra during urination. Older women and men are more likely to be tired, shaky, and weak and have muscle aches and abdominal pain. A fever may mean the infection is in your kidneys. Other symptoms of a kidney infection include pain in your back or sides below the ribs, nausea, and vomiting. DIAGNOSIS To diagnose a UTI, your caregiver will ask you about your symptoms. Your caregiver will also ask you to provide a urine sample. The urine sample will be tested for bacteria and white blood cells. White blood cells are made by your body to help fight infection. TREATMENT  Typically, UTIs can be treated with medication. Because most UTIs are caused by a bacterial infection, they usually can  be treated with the use of antibiotics. The choice of antibiotic and length of treatment depend on your symptoms and the type of bacteria causing your infection. HOME CARE INSTRUCTIONS  If you were prescribed antibiotics, take them exactly as your caregiver instructs you. Finish the medication even if you feel better after you have only taken some of the medication.  Drink enough water and fluids to keep your urine clear or pale yellow.  Avoid caffeine, tea, and carbonated beverages. They tend to irritate your bladder.  Empty your bladder often. Avoid holding urine for long periods of time.  Empty your bladder before and after sexual intercourse.  After a bowel movement, women should cleanse from front to back. Use each tissue only once. SEEK MEDICAL CARE IF:   You have back pain.  You develop a fever.  Your symptoms do not begin to resolve within 3 days. SEEK IMMEDIATE MEDICAL CARE IF:   You have severe back pain or lower abdominal pain.  You develop chills.  You have nausea or vomiting.  You have continued burning or discomfort with urination. MAKE SURE YOU:   Understand these instructions.  Will watch your condition.  Will get help right away if you are not doing well or get worse.   This information is not intended to replace advice given to you by your health care provider. Make sure you discuss any questions you have with your health care provider.   Document Released: 05/31/2005 Document Revised: 05/12/2015 Document Reviewed: 09/29/2011 Elsevier Interactive Patient Education 2016 [employer].  Fall Prevention  in the Home  Falls can cause injuries and can affect people from all age groups. There are many simple things that you can do to make your home safe and to help prevent falls. WHAT CAN I DO ON THE OUTSIDE OF MY HOME?  Regularly repair the edges of walkways and driveways and fix any cracks.  Remove high doorway thresholds.  Trim any shrubbery on the  main path into your home.  Use bright outdoor lighting.  Clear walkways of debris and clutter, including tools and rocks.  Regularly check that handrails are securely fastened and in good repair. Both sides of any steps should have handrails.  Install guardrails along the edges of any raised decks or porches.  Have leaves, snow, and ice cleared regularly.  Use sand or salt on walkways during winter months.  In the garage, clean up any spills right away, including grease or oil spills. WHAT CAN I DO IN THE BATHROOM?  Use night lights.  Install grab bars by the toilet and in the tub and shower. Do not use towel bars as grab bars.  Use non-skid mats or decals on the floor of the tub or shower.  If you need to sit down while you are in the shower, use a plastic, non-slip stool.Marland Kitchen  Keep the floor dry. Immediately clean up any water that spills on the floor.  Remove soap buildup in the tub or shower on a regular basis.  Attach bath mats securely with double-sided non-slip rug tape.  Remove throw rugs and other tripping hazards from the floor. WHAT CAN I DO IN THE BEDROOM?  Use night lights.  Make sure that a bedside light is easy to reach.  Do not use oversized bedding that drapes onto the floor.  Have a firm chair that has side arms to use for getting dressed.  Remove throw rugs and other tripping hazards from the floor. WHAT CAN I DO IN THE KITCHEN?   Clean up any spills right away.  Avoid walking on wet floors.  Place frequently used items in easy-to-reach places.  If you need to reach for something above you, use a sturdy step stool that has a grab bar.  Keep electrical cables out of the way.  Do not use floor polish or wax that makes floors slippery. If you have to use wax, make sure that it is non-skid floor wax.  Remove throw rugs and other tripping hazards from the floor. WHAT CAN I DO IN THE STAIRWAYS?  Do not leave any items on the stairs.  Make sure  that there are handrails on both sides of the stairs. Fix handrails that are broken or loose. Make sure that handrails are as long as the stairways.  Check any carpeting to make sure that it is firmly attached to the stairs. Fix any carpet that is loose or worn.  Avoid having throw rugs at the top or bottom of stairways, or secure the rugs with carpet tape to prevent them from moving.  Make sure that you have a light switch at the top of the stairs and the bottom of the stairs. If you do not have them, have them installed. WHAT ARE SOME OTHER FALL PREVENTION TIPS?  Wear closed-toe shoes that fit well and support your feet. Wear shoes that have rubber soles or low heels.  When you use a stepladder, make sure that it is completely opened and that the sides are firmly locked. Have someone hold the ladder while you are  using it. Do not climb a closed stepladder.  Add color or contrast paint or tape to grab bars and handrails in your home. Place contrasting color strips on the first and last steps.  Use mobility aids as needed, such as canes, walkers, scooters, and crutches.  Turn on lights if it is dark. Replace any light bulbs that burn out.  Set up furniture so that there are clear paths. Keep the furniture in the same spot.  Fix any uneven floor surfaces.  Choose a carpet design that does not hide the edge of steps of a stairway.  Be aware of any and all pets.  Review your medicines with your healthcare provider. Some medicines can cause dizziness or changes in blood pressure, which increase your risk of falling. Talk with your health care provider about other ways that you can decrease your risk of falls. This may include working with a physical therapist or trainer to improve your strength, balance, and endurance.   This information is not intended to replace advice given to you by your health care provider. Make sure you discuss any questions you have with your health care provider.    Document Released: 08/11/2002 Document Revised: 01/05/2015 Document Reviewed: 09/25/2014 Elsevier Interactive Patient Education 2016 Elsevier Inc[employer]  Cephalexin tablets or capsules What is this medicine? CEPHALEXIN (sef a LEX in) is a cephalosporin antibiotic. It is used to treat certain kinds of bacterial infections It will not work for colds, flu, or other viral infections. This medicine may be used for other purposes; ask your health care provider or pharmacist if you have questions. What should I tell my health care provider before I take this medicine? They need to know if you have any of these conditions: -kidney disease -stomach or intestine problems, especially colitis -an unusual or allergic reaction to cephalexin, other cephalosporins, penicillins, other antibiotics, medicines, foods, dyes or preservatives -pregnant or trying to get pregnant -breast-feeding How should I use this medicine? Take this medicine by mouth with a full glass of water. Follow the directions on the prescription label. This medicine can be taken with or without food. Take your medicine at regular intervals. Do not take your medicine more often than directed. Take all of your medicine as directed even if you think you are better. Do not skip doses or stop your medicine early. Talk to your pediatrician regarding the use of this medicine in children. While this drug may be prescribed for selected conditions, precautions do apply. Overdosage: If you think you have taken too much of this medicine contact a poison control center or emergency room at once. NOTE: This medicine is only for you. Do not share this medicine with others. What if I miss a dose? If you miss a dose, take it as soon as you can. If it is almost time for your next dose, take only that dose. Do not take double or extra doses. There should be at least 4 to 6 hours between doses. What may interact with this medicine? -probenecid -some other  antibiotics This list may not describe all possible interactions. Give your health care provider a list of all the medicines, herbs, non-prescription drugs, or dietary supplements you use. Also tell them if you smoke, drink alcohol, or use illegal drugs. Some items may interact with your medicine. What should I watch for while using this medicine? Tell your doctor or health care professional if your symptoms do not begin to improve in a few days. Do not treat diarrhea  with over the counter products. Contact your doctor if you have diarrhea that lasts more than 2 days or if it is severe and watery. If you have diabetes, you may get a false-positive result for sugar in your urine. Check with your doctor or health care professional. What side effects may I notice from receiving this medicine? Side effects that you should report to your doctor or health care professional as soon as possible: -allergic reactions like skin rash, itching or hives, swelling of the face, lips, or tongue -breathing problems -pain or trouble passing urine -redness, blistering, peeling or loosening of the skin, including inside the mouth -severe or watery diarrhea -unusually weak or tired -yellowing of the eyes, skin Side effects that usually do not require medical attention (report to your doctor or health care professional if they continue or are bothersome): -gas or heartburn -genital or anal irritation -headache -joint or muscle pain -nausea, vomiting This list may not describe all possible side effects. Call your doctor for medical advice about side effects. You may report side effects to FDA at 1-800-FDA-1088. Where should I keep my medicine? Keep out of the reach of children. Store at room temperature between 59 and 86 degrees F (15 and 30 degrees C). Throw away any unused medicine after the expiration date. NOTE: This sheet is a summary. It may not cover all possible information. If you have questions about this  medicine, talk to your doctor, pharmacist, or health care provider.    2016, Elsevier/Gold Standard. (2007-11-25 17:09:13)

## 2016-02-16 LAB — URINE CULTURE

## 2016-02-23 DIAGNOSIS — E039 Hypothyroidism, unspecified: Secondary | ICD-10-CM | POA: Diagnosis not present

## 2016-02-23 DIAGNOSIS — R600 Localized edema: Secondary | ICD-10-CM | POA: Diagnosis not present

## 2016-02-23 DIAGNOSIS — I2510 Atherosclerotic heart disease of native coronary artery without angina pectoris: Secondary | ICD-10-CM | POA: Diagnosis not present

## 2016-02-23 DIAGNOSIS — M1990 Unspecified osteoarthritis, unspecified site: Secondary | ICD-10-CM | POA: Diagnosis not present

## 2016-02-23 DIAGNOSIS — I10 Essential (primary) hypertension: Secondary | ICD-10-CM | POA: Diagnosis not present

## 2016-02-23 DIAGNOSIS — E785 Hyperlipidemia, unspecified: Secondary | ICD-10-CM | POA: Diagnosis not present

## 2016-03-01 DIAGNOSIS — E860 Dehydration: Secondary | ICD-10-CM | POA: Diagnosis not present

## 2016-03-01 DIAGNOSIS — W19XXXD Unspecified fall, subsequent encounter: Secondary | ICD-10-CM | POA: Diagnosis not present

## 2016-03-01 DIAGNOSIS — I959 Hypotension, unspecified: Secondary | ICD-10-CM | POA: Diagnosis not present

## 2016-03-13 DIAGNOSIS — E039 Hypothyroidism, unspecified: Secondary | ICD-10-CM | POA: Diagnosis not present

## 2016-03-13 DIAGNOSIS — I10 Essential (primary) hypertension: Secondary | ICD-10-CM | POA: Diagnosis not present

## 2016-03-13 DIAGNOSIS — E785 Hyperlipidemia, unspecified: Secondary | ICD-10-CM | POA: Diagnosis not present

## 2016-03-13 DIAGNOSIS — M1990 Unspecified osteoarthritis, unspecified site: Secondary | ICD-10-CM | POA: Diagnosis not present

## 2016-03-13 DIAGNOSIS — R600 Localized edema: Secondary | ICD-10-CM | POA: Diagnosis not present

## 2016-03-20 ENCOUNTER — Inpatient Hospital Stay (HOSPITAL_COMMUNITY)
Admission: EM | Admit: 2016-03-20 | Discharge: 2016-03-24 | DRG: 300 | Disposition: A | Discharge status: Home / Self Care | Payer: Medicare HMO | Attending: Internal Medicine | Admitting: Internal Medicine

## 2016-03-20 ENCOUNTER — Emergency Department (HOSPITAL_COMMUNITY): Payer: Medicare HMO

## 2016-03-20 ENCOUNTER — Encounter (HOSPITAL_COMMUNITY): Payer: Self-pay | Admitting: *Deleted

## 2016-03-20 DIAGNOSIS — I129 Hypertensive chronic kidney disease with stage 1 through stage 4 chronic kidney disease, or unspecified chronic kidney disease: Secondary | ICD-10-CM | POA: Diagnosis not present

## 2016-03-20 DIAGNOSIS — M7989 Other specified soft tissue disorders: Secondary | ICD-10-CM

## 2016-03-20 DIAGNOSIS — Z806 Family history of leukemia: Secondary | ICD-10-CM

## 2016-03-20 DIAGNOSIS — I272 Other secondary pulmonary hypertension: Secondary | ICD-10-CM | POA: Diagnosis not present

## 2016-03-20 DIAGNOSIS — I429 Cardiomyopathy, unspecified: Secondary | ICD-10-CM | POA: Diagnosis not present

## 2016-03-20 DIAGNOSIS — M25571 Pain in right ankle and joints of right foot: Secondary | ICD-10-CM | POA: Diagnosis not present

## 2016-03-20 DIAGNOSIS — Z8673 Personal history of transient ischemic attack (TIA), and cerebral infarction without residual deficits: Secondary | ICD-10-CM | POA: Diagnosis not present

## 2016-03-20 DIAGNOSIS — F0390 Unspecified dementia without behavioral disturbance: Secondary | ICD-10-CM | POA: Diagnosis present

## 2016-03-20 DIAGNOSIS — Z66 Do not resuscitate: Secondary | ICD-10-CM | POA: Diagnosis present

## 2016-03-20 DIAGNOSIS — I82401 Acute embolism and thrombosis of unspecified deep veins of right lower extremity: Secondary | ICD-10-CM | POA: Diagnosis not present

## 2016-03-20 DIAGNOSIS — M25572 Pain in left ankle and joints of left foot: Secondary | ICD-10-CM | POA: Diagnosis not present

## 2016-03-20 DIAGNOSIS — N183 Chronic kidney disease, stage 3 unspecified: Secondary | ICD-10-CM | POA: Diagnosis present

## 2016-03-20 DIAGNOSIS — I824Z9 Acute embolism and thrombosis of unspecified deep veins of unspecified distal lower extremity: Secondary | ICD-10-CM

## 2016-03-20 DIAGNOSIS — R739 Hyperglycemia, unspecified: Secondary | ICD-10-CM | POA: Diagnosis present

## 2016-03-20 DIAGNOSIS — E039 Hypothyroidism, unspecified: Secondary | ICD-10-CM | POA: Diagnosis not present

## 2016-03-20 DIAGNOSIS — Z8249 Family history of ischemic heart disease and other diseases of the circulatory system: Secondary | ICD-10-CM

## 2016-03-20 DIAGNOSIS — I82431 Acute embolism and thrombosis of right popliteal vein: Secondary | ICD-10-CM | POA: Diagnosis not present

## 2016-03-20 DIAGNOSIS — D649 Anemia, unspecified: Secondary | ICD-10-CM | POA: Diagnosis present

## 2016-03-20 DIAGNOSIS — I10 Essential (primary) hypertension: Secondary | ICD-10-CM | POA: Diagnosis not present

## 2016-03-20 DIAGNOSIS — I82409 Acute embolism and thrombosis of unspecified deep veins of unspecified lower extremity: Secondary | ICD-10-CM | POA: Diagnosis present

## 2016-03-20 DIAGNOSIS — R69 Illness, unspecified: Secondary | ICD-10-CM | POA: Diagnosis not present

## 2016-03-20 LAB — I-STAT CHEM 8, ED
BUN: 20 mg/dL (ref 6–20)
CALCIUM ION: 1.17 mmol/L (ref 1.12–1.23)
CHLORIDE: 107 mmol/L (ref 101–111)
CREATININE: 1.3 mg/dL — AB (ref 0.44–1.00)
Glucose, Bld: 104 mg/dL — ABNORMAL HIGH (ref 65–99)
HCT: 27 % — ABNORMAL LOW (ref 36.0–46.0)
Hemoglobin: 9.2 g/dL — ABNORMAL LOW (ref 12.0–15.0)
Potassium: 3.6 mmol/L (ref 3.5–5.1)
SODIUM: 144 mmol/L (ref 135–145)
TCO2: 25 mmol/L (ref 0–100)

## 2016-03-20 LAB — CBC
HCT: 29.4 % — ABNORMAL LOW (ref 36.0–46.0)
HEMOGLOBIN: 9.4 g/dL — AB (ref 12.0–15.0)
MCH: 28.9 pg (ref 26.0–34.0)
MCHC: 32 g/dL (ref 30.0–36.0)
MCV: 90.5 fL (ref 78.0–100.0)
Platelets: 180 10*3/uL (ref 150–400)
RBC: 3.25 MIL/uL — AB (ref 3.87–5.11)
RDW: 13.6 % (ref 11.5–15.5)
WBC: 4.7 10*3/uL (ref 4.0–10.5)

## 2016-03-20 LAB — PROTIME-INR
INR: 1.12 (ref 0.00–1.49)
PROTHROMBIN TIME: 14.6 s (ref 11.6–15.2)

## 2016-03-20 LAB — APTT: APTT: 199 s — AB (ref 24–37)

## 2016-03-20 MED ORDER — HYDROCODONE-ACETAMINOPHEN 5-325 MG PO TABS: 2.0000 | ORAL_TABLET | Freq: Once | ORAL | Status: DC

## 2016-03-20 MED ORDER — HEPARIN (PORCINE) IN NACL 100-0.45 UNIT/ML-% IJ SOLN
950.0000 [IU]/h | INTRAMUSCULAR | Status: DC
  Administered 2016-03-20 – 2016-03-22 (×3): 950 [IU]/h via INTRAVENOUS
  Filled 2016-03-20 (×4): qty 250

## 2016-03-20 MED ORDER — GABAPENTIN 100 MG PO CAPS
100.0000 mg | ORAL_CAPSULE | Freq: Two times a day (BID) | ORAL | Status: DC
  Administered 2016-03-20 – 2016-03-24 (×6): 100 mg via ORAL
  Filled 2016-03-20 (×8): qty 1

## 2016-03-20 MED ORDER — CARVEDILOL 12.5 MG PO TABS
25.0000 mg | ORAL_TABLET | Freq: Two times a day (BID) | ORAL | Status: DC
  Administered 2016-03-20 – 2016-03-24 (×8): 25 mg via ORAL
  Filled 2016-03-20 (×7): qty 2
  Filled 2016-03-20: qty 8
  Filled 2016-03-20: qty 2

## 2016-03-20 MED ORDER — ALPRAZOLAM 0.5 MG PO TABS
0.5000 mg | ORAL_TABLET | Freq: Two times a day (BID) | ORAL | Status: DC
Start: 2016-03-20 — End: 2016-03-24
  Administered 2016-03-20 – 2016-03-24 (×6): 0.5 mg via ORAL
  Filled 2016-03-20 (×11): qty 1

## 2016-03-20 MED ORDER — FUROSEMIDE 20 MG PO TABS
20.0000 mg | ORAL_TABLET | Freq: Every day | ORAL | Status: DC
  Administered 2016-03-20 – 2016-03-24 (×5): 20 mg via ORAL
  Filled 2016-03-20 (×5): qty 1

## 2016-03-20 MED ORDER — LEVOTHYROXINE SODIUM 75 MCG PO TABS
75.0000 ug | ORAL_TABLET | Freq: Every day | ORAL | Status: DC
  Administered 2016-03-21 – 2016-03-24 (×4): 75 ug via ORAL
  Filled 2016-03-20 (×4): qty 1

## 2016-03-20 MED ORDER — HEPARIN BOLUS VIA INFUSION
3500.0000 [IU] | Freq: Once | INTRAVENOUS | Status: AC
  Administered 2016-03-20: 3500 [IU] via INTRAVENOUS
  Filled 2016-03-20: qty 3500

## 2016-03-20 MED ORDER — WARFARIN SODIUM 5 MG PO TABS
5.0000 mg | ORAL_TABLET | Freq: Once | ORAL | Status: AC
  Administered 2016-03-20: 5 mg via ORAL
  Filled 2016-03-20: qty 1

## 2016-03-20 MED ORDER — POTASSIUM CHLORIDE CRYS ER 20 MEQ PO TBCR
20.0000 meq | EXTENDED_RELEASE_TABLET | Freq: Every day | ORAL | Status: DC
  Administered 2016-03-20 – 2016-03-24 (×5): 20 meq via ORAL
  Filled 2016-03-20 (×5): qty 1

## 2016-03-20 MED ORDER — PANTOPRAZOLE SODIUM 40 MG PO TBEC
40.0000 mg | DELAYED_RELEASE_TABLET | Freq: Every day | ORAL | Status: DC
  Administered 2016-03-20 – 2016-03-24 (×5): 40 mg via ORAL
  Filled 2016-03-20 (×5): qty 1

## 2016-03-20 MED ORDER — WARFARIN - PHARMACIST DOSING INPATIENT: Freq: Every day | Status: DC

## 2016-03-20 NOTE — ED Notes (Signed)
Pt comes in with leg swelling bilaterally with majority of swelling in her left foot. Pt states this started 3 days ago.

## 2016-03-20 NOTE — Progress Notes (Signed)
ANTICOAGULATION CONSULT NOTE - Initial Consult  Pharmacy Consult for heparin and coumadin Indication: DVT  Allergies  Allergen Reactions  . Aspirin Other (See Comments)    unknown    Patient Measurements: Height: 5' (152.4 cm) Weight: 142 lb (64.411 kg) IBW/kg (Calculated) : 45.5 HEPARIN DW (KG): 59.1  Vital Signs: Temp: 99.1 F (37.3 C) (07/17 1942) Temp Source: Oral (07/17 1942) BP: 156/63 mmHg (07/17 1942) Pulse Rate: 61 (07/17 1942)  Labs:  Recent Labs  03/20/16 1252  HGB 9.2*  HCT 27.0*  CREATININE 1.30*    Estimated Creatinine Clearance: 24.6 mL/min (by C-G formula based on Cr of 1.3).   Medical History: Past Medical History  Diagnosis Date  . Cardiomyopathy     Nonischemic, EF 25-30% 2007 / EF improved 50% 2008, ( catheterization not done) nuclear 2007, fixed anteroapical defect, no ischemia  . Ejection fraction     EF 50%, echo, 2008  . Hypertension   . Pulmonary hypertension (HCC)   . ASD (atrial septal defect)     Small  . Edema     Chronic lower extremity edema... venous insufficiency  . Hypothyroidism   . Stroke (HCC)   . Diverticulitis   . Gout   . CKD (chronic kidney disease)   . Chronic back pain greater than 3 months duration   . Anemia   . Multilevel degenerative disc disease     Medications:  Prescriptions prior to admission  Medication Sig Dispense Refill Last Dose  . ALPRAZolam (XANAX) 0.5 MG tablet Take 0.5 mg by mouth 2 (two) times daily.   03/20/2016 at Unknown time  . carvedilol (COREG) 25 MG tablet Take 25 mg by mouth 2 (two) times daily with a meal.    03/20/2016 at 0900  . furosemide (LASIX) 20 MG tablet Take 1 tablet (20 mg total) by mouth daily. (Patient taking differently: Take 20 mg by mouth 2 (two) times daily. ) 30 tablet 3 03/19/2016 at Unknown time  . gabapentin (NEURONTIN) 100 MG capsule Take 100 mg by mouth 2 (two) times daily.    03/20/2016 at Unknown time  . levothyroxine (SYNTHROID, LEVOTHROID) 75 MCG tablet Take  75 mcg by mouth daily.     03/20/2016 at Unknown time  . oxybutynin (DITROPAN) 5 MG tablet Take 5 mg by mouth daily.   03/20/2016 at Unknown time  . pantoprazole (PROTONIX) 40 MG tablet Take 1 tablet (40 mg total) by mouth 2 (two) times daily. (Patient taking differently: Take 40 mg by mouth daily. ) 30 tablet 3 03/20/2016 at Unknown time  . potassium chloride SA (K-DUR,KLOR-CON) 20 MEQ tablet Take 20 mEq by mouth daily.    03/20/2016 at Unknown time  . cephALEXin (KEFLEX) 500 MG capsule Take 1 capsule (500 mg total) by mouth 3 (three) times daily. (Patient not taking: Reported on 03/20/2016) 30 capsule 0 Not Taking at Unknown time    Assessment: 82 y.o. female, w Hypertension, CKD stage 3, Hypothyroidism, Cardiomyopathy, Pulmonary htn, apparently presents with c/o lower ext edema worse for the past 2 -3 days. Pt has pain in the feet due to swelling. Pt unable to walk due to leg swelling. Ultrasound shows DVT. No hx of DVT.  Pt will be admitted for DVT and anticoagulation with Heparin and Coumadin  Goal of Therapy:  Heparin level 0.3-0.7  INR 2-3 Monitor platelets by anticoagulation protocol: Yes   Plan:  Coumadin 5mg  po x 1 today Give 3500 units bolus x 1 Start heparin infusion at 950 units/hr  Check anti-Xa level in 8 hours and daily while on heparin Continue to monitor H&H and platelets  Elder Cyphers, BS Loura Back, BCPS Clinical Pharmacist Pager [phone]  03/20/2016,7:48 PM

## 2016-03-20 NOTE — H&P (Addendum)
TRH H&P   Patient Demographics:    [name], is a 82 y.o. female  MRN: [id number]   DOB - 1925-04-18  Admit Date - 03/20/2016  Outpatient Primary MD for the patient is Avon Gully, MD  Referring MD/NP/PA: Sampson Goon   Outpatient Specialists:   Patient coming from: home  Chief Complaint  Patient presents with  . Leg Swelling      HPI:    [name]  is a 82 y.o. female, w Hypertension, CKD stage 3, Hypothyroidism, Cardiomyopathy, Pulmonary htn, apparently presents with c/o lower ext edema worse for the past 2 -3 days.  Pt has pain in the feet due to swelling.  Pt unable to walk due to leg swelling.   In Ed, ultrasound shows DVT,  Chronic.  No hx of DVT.  Pt is amenable to anticoagulation.  Pt will be admitted for DVT.     Review of systems:    In addition to the HPI above,  No Fever-chills, No Headache, No changes with Vision or hearing, No problems swallowing food or Liquids, No Chest pain, Cough or Shortness of Breath, No Abdominal pain, No Nausea or Vommitting, Bowel movements are regular, No Blood in stool or Urine, No dysuria, No new skin rashes or bruises, No new joints pains-aches,  No new weakness, tingling, numbness in any extremity, No recent weight gain or loss, No polyuria, polydypsia or polyphagia, No significant Mental Stressors.  A full 10 point Review of Systems was done, except as stated above, all other Review of Systems were negative.   With Past History of the following :    Past Medical History  Diagnosis Date  . Cardiomyopathy     Nonischemic, EF 25-30% 2007 / EF improved 50% 2008, ( catheterization not done) nuclear 2007, fixed anteroapical defect, no ischemia  . Ejection fraction     EF 50%, echo, 2008  . Hypertension   . Pulmonary hypertension (HCC)   . ASD (atrial septal defect)     Small  . Edema     Chronic lower  extremity edema... venous insufficiency  . Hypothyroidism   . Stroke (HCC)   . Diverticulitis   . Gout   . CKD (chronic kidney disease)   . Chronic back pain greater than 3 months duration   . Anemia   . Multilevel degenerative disc disease       Past Surgical History  Procedure Laterality Date  . Abdominal hysterectomy    . Breast surgery    . Cataract extraction        Social History:     Social History  Substance Use Topics  . Smoking status: Never Smoker   . Smokeless tobacco: Never Used  . Alcohol Use: No     Lives - at home.   Mobility -  Unable to walk,  Prev walked with walker.    Family History :  Family History  Problem Relation Age of Onset  . Coronary artery disease      POSITIVE HX IN FAMILY  . Heart attack Mother   . Leukemia Father       Home Medications:   Prior to Admission medications   Medication Sig Start Date End Date Taking? Authorizing Provider  ALPRAZolam Prudy Feeler) 0.5 MG tablet Take 0.5 mg by mouth 2 (two) times daily.   Yes Historical Provider, MD  carvedilol (COREG) 25 MG tablet Take 25 mg by mouth 2 (two) times daily with a meal.    Yes Historical Provider, MD  furosemide (LASIX) 20 MG tablet Take 1 tablet (20 mg total) by mouth daily. Patient taking differently: Take 20 mg by mouth 2 (two) times daily.  02/12/15  Yes Avon Gully, MD  gabapentin (NEURONTIN) 100 MG capsule Take 100 mg by mouth 2 (two) times daily.  12/09/14  Yes Historical Provider, MD  levothyroxine (SYNTHROID, LEVOTHROID) 75 MCG tablet Take 75 mcg by mouth daily.     Yes Historical Provider, MD  oxybutynin (DITROPAN) 5 MG tablet Take 5 mg by mouth daily.   Yes Historical Provider, MD  pantoprazole (PROTONIX) 40 MG tablet Take 1 tablet (40 mg total) by mouth 2 (two) times daily. Patient taking differently: Take 40 mg by mouth daily.  09/12/14  Yes Avon Gully, MD  potassium chloride SA (K-DUR,KLOR-CON) 20 MEQ tablet Take 20 mEq by mouth daily.  12/09/14  Yes  Historical Provider, MD  cephALEXin (KEFLEX) 500 MG capsule Take 1 capsule (500 mg total) by mouth 3 (three) times daily. Patient not taking: Reported on 03/20/2016 02/15/16   Dione Booze, MD     Allergies:     Allergies  Allergen Reactions  . Aspirin Other (See Comments)    unknown     Physical Exam:   Vitals  Blood pressure 162/58, pulse 60, temperature 98.4 F (36.9 C), temperature source Oral, resp. rate 16, height 5' (1.524 m), weight 64.411 kg (142 lb), SpO2 97 %.   1. General  lying in bed in NAD,    2. Normal affect and insight, Not Suicidal or Homicidal, Awake Alert, Oriented X 3.  3. No F.N deficits, ALL C.Nerves Intact, Strength 5/5 all 4 extremities, Sensation intact all 4 extremities, Plantars down going.  4. Ears and Eyes appear Normal, Conjunctivae clear, PERRLA. Moist Oral Mucosa.  5. Supple Neck, No JVD, No cervical lymphadenopathy appriciated, No Carotid Bruits.  6. Symmetrical Chest wall movement, Good air movement bilaterally, CTAB.  7. RRR, No Gallops, Rubs or Murmurs, No Parasternal Heave.  8. Positive Bowel Sounds, Abdomen Soft, No tenderness, No organomegaly appriciated,No rebound -guarding or rigidity.  9.  No Cyanosis, Normal Skin Turgor, No Skin Rash or Bruise.  + edema  2+ in the bilateral lower ext, negative homans.    10. Good muscle tone,  joints appear normal , no effusions, Normal ROM.  11. No Palpable Lymph Nodes in Neck or Axillae    Data Review:    CBC  Recent Labs Lab 03/20/16 1252  HGB 9.2*  HCT 27.0*   ------------------------------------------------------------------------------------------------------------------  Chemistries   Recent Labs Lab 03/20/16 1252  NA 144  K 3.6  CL 107  GLUCOSE 104*  BUN 20  CREATININE 1.30*   ------------------------------------------------------------------------------------------------------------------ estimated creatinine clearance is 24.6 mL/min (by C-G formula based on Cr of  1.3). ------------------------------------------------------------------------------------------------------------------ No results for input(s): TSH, T4TOTAL, T3FREE, THYROIDAB in the last 72 hours.  Invalid input(s): FREET3  Coagulation profile No results  for input(s): INR, PROTIME in the last 168 hours. ------------------------------------------------------------------------------------------------------------------- No results for input(s): DDIMER in the last 72 hours. -------------------------------------------------------------------------------------------------------------------  Cardiac Enzymes No results for input(s): CKMB, TROPONINI, MYOGLOBIN in the last 168 hours.  Invalid input(s): CK ------------------------------------------------------------------------------------------------------------------ No results found for: BNP   ---------------------------------------------------------------------------------------------------------------  Urinalysis    Component Value Date/Time   COLORURINE YELLOW 02/14/2016 2349   APPEARANCEUR CLEAR 02/14/2016 2349   LABSPEC 1.010 02/14/2016 2349   PHURINE 6.0 02/14/2016 2349   GLUCOSEU NEGATIVE 02/14/2016 2349   HGBUR TRACE* 02/14/2016 2349   BILIRUBINUR NEGATIVE 02/14/2016 2349   KETONESUR NEGATIVE 02/14/2016 2349   PROTEINUR NEGATIVE 02/14/2016 2349   UROBILINOGEN 0.2 02/10/2015 1115   NITRITE NEGATIVE 02/14/2016 2349   LEUKOCYTESUR NEGATIVE 02/14/2016 2349    ----------------------------------------------------------------------------------------------------------------   Imaging Results:    Dg Ankle Complete Left  03/20/2016  CLINICAL DATA:  Pain and swelling EXAM: LEFT ANKLE COMPLETE - 3+ VIEW COMPARISON:  None. FINDINGS: Frontal, oblique, and lateral views were obtained. There is generalized soft tissue swelling. There is a questionable old avulsion along the medial malleolus. No acute fracture is evident. No joint  effusion. The ankle mortise appears intact. There are spurs arising from the posterior inferior calcaneus. Joint spaces appear normal. There are scattered foci of arterial vascular calcification in the anterior tibial artery. IMPRESSION: Generalized soft tissue swelling. Question old injury medial malleolus. No acute fracture. Ankle mortise appears intact. There are calcaneal spurs. There is atherosclerotic calcification in the anterior tibial artery. Electronically Signed   By: Bretta Bang III M.D.   On: 03/20/2016 15:56   Dg Ankle Complete Right  03/20/2016  CLINICAL DATA:  Bilateral ankle pain and swelling, no known injury EXAM: RIGHT ANKLE - COMPLETE 3+ VIEW COMPARISON:  None. FINDINGS: Three views of the right ankle submitted no acute fracture or subluxation. There is diffuse soft tissue swelling around the ankle. Mild spurring of distal tibia medial malleolus. Ankle mortise is preserved. Plantar spurring and posterior spurring of calcaneus. IMPRESSION: No acute fracture or subluxation. Diffuse soft tissue swelling around the ankle. Plantar and posterior spurring of calcaneus Electronically Signed   By: Natasha Mead M.D.   On: 03/20/2016 15:51   US Venous Img Lower Bilateral  03/20/2016  CLINICAL DATA:  82 year old female with chronic bilateral lower extremity swelling recently worse on the left. EXAM: BILATERAL LOWER EXTREMITY VENOUS DOPPLER ULTRASOUND TECHNIQUE: Gray-scale sonography with graded compression, as well as color Doppler and duplex ultrasound were performed to evaluate the lower extremity deep venous systems from the level of the common femoral vein and including the common femoral, femoral, profunda femoral, popliteal and calf veins including the posterior tibial, peroneal and gastrocnemius veins when visible. The superficial great saphenous vein was also interrogated. Spectral Doppler was utilized to evaluate flow at rest and with distal augmentation maneuvers in the common femoral,  femoral and popliteal veins. COMPARISON:  None. FINDINGS: RIGHT LOWER EXTREMITY Common Femoral Vein: No evidence of thrombus. Normal compressibility, respiratory phasicity and response to augmentation. Saphenofemoral Junction: No evidence of thrombus. Normal compressibility and flow on color Doppler imaging. Profunda Femoral Vein: No evidence of thrombus. Normal compressibility and flow on color Doppler imaging. Femoral Vein: No evidence of thrombus. Normal compressibility, respiratory phasicity and response to augmentation. Popliteal Vein: Partial compressibility of the popliteal vein secondary to eccentric echogenic wall thickening. Color flow is present within the vessel lumen. Calf Veins: No evidence of thrombus. Normal compressibility and flow on color Doppler imaging. Limited visualization of the peroneal vein.  Superficial Great Saphenous Vein: No evidence of thrombus. Normal compressibility and flow on color Doppler imaging. Venous Reflux:  None. Other Findings:  Superficial subcutaneous edema at the ankle. LEFT LOWER EXTREMITY Common Femoral Vein: No evidence of thrombus. Normal compressibility, respiratory phasicity and response to augmentation. Saphenofemoral Junction: No evidence of thrombus. Normal compressibility and flow on color Doppler imaging. Profunda Femoral Vein: No evidence of thrombus. Normal compressibility and flow on color Doppler imaging. Femoral Vein: No evidence of thrombus. Normal compressibility, respiratory phasicity and response to augmentation. Popliteal Vein: No evidence of thrombus. Normal compressibility, respiratory phasicity and response to augmentation. Calf Veins: No evidence of thrombus. Normal compressibility and flow on color Doppler imaging. Limited visualization of the peroneal vein. Superficial Great Saphenous Vein: No evidence of thrombus. Normal compressibility and flow on color Doppler imaging. Venous Reflux:  None. Other Findings: Superficial subcutaneous edema in  the calf and ankle. IMPRESSION: 1. Positive for nonocclusive DVT in the right popliteal vein in the form of eccentric wall thickening. Findings are most suggestive of partially recanalized chronic DVT. 2. No evidence of acute or chronic DVT on the left. 3. Superficial subcutaneous edema worse on the left than the right. 4. Bilateral superficial inguinal adenopathy is presumably reactive. Electronically Signed   By: Malachy Moan M.D.   On: 03/20/2016 14:33       Assessment & Plan:    Active Problems:   Hypothyroidism   Anemia   DVT (deep venous thrombosis) (HCC)   CKD (chronic kidney disease) stage 3, GFR 30-59 ml/min   Hyperglycemia    1. DVT Heparin per pharmacy consult Due to renal insufficiency will need transition to coumadin   2. Deconditioning.  PT to evaluate and tx. May need SNF for rehab  3. Hyperglycemia Check hga1c  4. Anemia Check cbc in am,  Consider iron studies, b12, folate, tsh, spep, upep, defer to Dr. Felecia Shelling   DVT Prophylaxis Heparin -    AM Labs Ordered, also please review Full Orders  Family Communication: Admission, patients condition and plan of care including tests being ordered have been discussed with the patient and daughter who indicate understanding and agree with the plan and Code Status.  Code Status DNR  Likely DC to  SNF  Condition GUARDED    Consults called:   Admission status:  inpatient  Time spent in minutes : 45 minutes   Pearson Grippe M.D on 03/20/2016 at 6:42 PM  Between 7am to 7pm - Pager - [phone]  After 7pm go to [email or web address] - password [email or web address]  [facility] - Office  [phone]

## 2016-03-20 NOTE — ED Provider Notes (Signed)
CSN: [id number]     Arrival date & time 03/20/16  1059 History   First MD Initiated Contact with Patient 03/20/16 1150     Chief Complaint  Patient presents with  . Leg Swelling   (Consider location/radiation/quality/duration/timing/severity/associated sxs/prior Treatment) HPI  [place] is an 82-y/o female with history of stroke, anemia, CKD and HTN who presents with 2 days of worsening leg swelling (L >R) and difficulty bearing weight. She denies trauma but daughter says she fell last week but did not hit her legs, caught herself on the wall, and slipped down. Daughter gave a dose of lasix last night and had pt her elevate her legs with improvement in swelling. Pt complains of tingling in the bottom of her L foot. She does not have leg pain at rest, but has sharp pain in both legs while standing and with palpation of legs. She usually uses a cane or a walker to get around at home and very seldom is able to ambulate without assistance. Daughter has been staying with pt, who normally lives alone, to provide assistance at home. She has been able to perform her ADLs, but daughter provides meals, drives, and helps with paying bills. She reports chronic exertional dyspnea. She denies history of blood clots and has never smoked. No history of gout. She denies abdominal pain or chest pain.   Past Medical History  Diagnosis Date  . Cardiomyopathy     Nonischemic, EF 25-30% 2007 / EF improved 50% 2008, ( catheterization not done) nuclear 2007, fixed anteroapical defect, no ischemia  . Ejection fraction     EF 50%, echo, 2008  . Hypertension   . Pulmonary hypertension (HCC)   . ASD (atrial septal defect)     Small  . Edema     Chronic lower extremity edema... venous insufficiency  . Hypothyroidism   . Stroke (HCC)   . Diverticulitis   . Gout   . CKD (chronic kidney disease)   . Chronic back pain greater than 3 months duration   . Anemia   . Multilevel degenerative disc disease    Past  Surgical History  Procedure Laterality Date  . Abdominal hysterectomy    . Breast surgery     Family History  Problem Relation Age of Onset  . Coronary artery disease      POSITIVE HX IN FAMILY   Social History  Substance Use Topics  . Smoking status: Never Smoker   . Smokeless tobacco: Never Used  . Alcohol Use: No   OB History    No data available     Review of Systems  Constitutional: Negative for fever and chills.  Eyes: Negative for visual disturbance.  Respiratory: Positive for shortness of breath (chronic). Negative for cough.   Cardiovascular: Positive for leg swelling. Negative for chest pain.  Gastrointestinal: Negative for nausea, vomiting, abdominal pain, diarrhea and blood in stool.  Genitourinary: Negative for dysuria.       Vaginal dryness  Musculoskeletal: Positive for gait problem. Negative for back pain.  Skin: Negative for rash and wound.  Neurological: Negative for dizziness, numbness and headaches.    Allergies  Aspirin - reports her mother was told she shouldn't take it when she was 51, pt unsure of reason  Home Medications   Prior to Admission medications   Medication Sig Start Date End Date Taking? Authorizing Provider  ALPRAZolam Prudy Feeler) 0.5 MG tablet Take 0.5 mg by mouth 2 (two) times daily.   Yes Historical Provider, MD  carvedilol (COREG) 25 MG tablet Take 25 mg by mouth 2 (two) times daily with a meal.    Yes Historical Provider, MD  furosemide (LASIX) 20 MG tablet Take 1 tablet (20 mg total) by mouth daily. Patient taking differently: Take 20 mg by mouth 2 (two) times daily.  02/12/15  Yes Avon Gully, MD  gabapentin (NEURONTIN) 100 MG capsule Take 100 mg by mouth 2 (two) times daily.  12/09/14  Yes Historical Provider, MD  levothyroxine (SYNTHROID, LEVOTHROID) 75 MCG tablet Take 75 mcg by mouth daily.     Yes Historical Provider, MD  oxybutynin (DITROPAN) 5 MG tablet Take 5 mg by mouth daily.   Yes Historical Provider, MD  pantoprazole  (PROTONIX) 40 MG tablet Take 1 tablet (40 mg total) by mouth 2 (two) times daily. Patient taking differently: Take 40 mg by mouth daily.  09/12/14  Yes Avon Gully, MD  potassium chloride SA (K-DUR,KLOR-CON) 20 MEQ tablet Take 20 mEq by mouth daily.  12/09/14  Yes Historical Provider, MD  cephALEXin (KEFLEX) 500 MG capsule Take 1 capsule (500 mg total) by mouth 3 (three) times daily. Patient not taking: Reported on 03/20/2016 02/15/16   Dione Booze, MD   BP 151/54 mmHg  Pulse 60  Temp(Src) 98.4 F (36.9 C) (Oral)  Resp 16  Ht 5' (1.524 m)  Wt 64.411 kg  BMI 27.73 kg/m2  SpO2 100% Physical Exam  Constitutional: She appears well-developed and well-nourished. No distress.  HENT:  Head: Normocephalic and atraumatic.  MM slightly tacky.  Eyes: Conjunctivae and EOM are normal. Pupils are equal, round, and reactive to light.  Neck: Normal range of motion. Neck supple.  Cardiovascular: Normal rate, regular rhythm, normal heart sounds and intact distal pulses.   No murmur heard. Pulmonary/Chest: Effort normal and breath sounds normal. No respiratory distress. She has no wheezes. She has no rales. She exhibits no tenderness.  Abdominal: Soft. Bowel sounds are normal. She exhibits no distension. There is no tenderness. There is no rebound and no guarding.  Musculoskeletal:  Edema of ankles L > R but non-pitting. Strength 3/5 with L dorsi and plantar flexion due to discomfort; 5-/5 on the R. Unable to stand with 2 person assist, reporting significant lower leg pain. No SI joint pain or hip instability. Able to extend both legs with 5-/5 strength.  Neurological: She is alert. No cranial nerve deficit.  Oriented to person, place and month but not year. Sensation of extremities intact.   Skin: Skin is warm and dry.  No ecchymosis, erythema or increased warmth of ankles bilaterally.  Nursing note and vitals reviewed.  ED Course  Procedures (including critical care time) Labs Review Labs Reviewed   I-STAT CHEM 8, ED - Abnormal; Notable for the following:    Creatinine, Ser 1.30 (*)    Glucose, Bld 104 (*)    Hemoglobin 9.2 (*)    HCT 27.0 (*)    All other components within normal limits    Imaging Review Dg Ankle Complete Left  03/20/2016  CLINICAL DATA:  Pain and swelling EXAM: LEFT ANKLE COMPLETE - 3+ VIEW COMPARISON:  None. FINDINGS: Frontal, oblique, and lateral views were obtained. There is generalized soft tissue swelling. There is a questionable old avulsion along the medial malleolus. No acute fracture is evident. No joint effusion. The ankle mortise appears intact. There are spurs arising from the posterior inferior calcaneus. Joint spaces appear normal. There are scattered foci of arterial vascular calcification in the anterior tibial artery. IMPRESSION: Generalized soft  tissue swelling. Question old injury medial malleolus. No acute fracture. Ankle mortise appears intact. There are calcaneal spurs. There is atherosclerotic calcification in the anterior tibial artery. Electronically Signed   By: Bretta Bang III M.D.   On: 03/20/2016 15:56   Dg Ankle Complete Right  03/20/2016  CLINICAL DATA:  Bilateral ankle pain and swelling, no known injury EXAM: RIGHT ANKLE - COMPLETE 3+ VIEW COMPARISON:  None. FINDINGS: Three views of the right ankle submitted no acute fracture or subluxation. There is diffuse soft tissue swelling around the ankle. Mild spurring of distal tibia medial malleolus. Ankle mortise is preserved. Plantar spurring and posterior spurring of calcaneus. IMPRESSION: No acute fracture or subluxation. Diffuse soft tissue swelling around the ankle. Plantar and posterior spurring of calcaneus Electronically Signed   By: Natasha Mead M.D.   On: 03/20/2016 15:51   US Venous Img Lower Bilateral  03/20/2016  CLINICAL DATA:  82 year old female with chronic bilateral lower extremity swelling recently worse on the left. EXAM: BILATERAL LOWER EXTREMITY VENOUS DOPPLER ULTRASOUND  TECHNIQUE: Gray-scale sonography with graded compression, as well as color Doppler and duplex ultrasound were performed to evaluate the lower extremity deep venous systems from the level of the common femoral vein and including the common femoral, femoral, profunda femoral, popliteal and calf veins including the posterior tibial, peroneal and gastrocnemius veins when visible. The superficial great saphenous vein was also interrogated. Spectral Doppler was utilized to evaluate flow at rest and with distal augmentation maneuvers in the common femoral, femoral and popliteal veins. COMPARISON:  None. FINDINGS: RIGHT LOWER EXTREMITY Common Femoral Vein: No evidence of thrombus. Normal compressibility, respiratory phasicity and response to augmentation. Saphenofemoral Junction: No evidence of thrombus. Normal compressibility and flow on color Doppler imaging. Profunda Femoral Vein: No evidence of thrombus. Normal compressibility and flow on color Doppler imaging. Femoral Vein: No evidence of thrombus. Normal compressibility, respiratory phasicity and response to augmentation. Popliteal Vein: Partial compressibility of the popliteal vein secondary to eccentric echogenic wall thickening. Color flow is present within the vessel lumen. Calf Veins: No evidence of thrombus. Normal compressibility and flow on color Doppler imaging. Limited visualization of the peroneal vein. Superficial Great Saphenous Vein: No evidence of thrombus. Normal compressibility and flow on color Doppler imaging. Venous Reflux:  None. Other Findings:  Superficial subcutaneous edema at the ankle. LEFT LOWER EXTREMITY Common Femoral Vein: No evidence of thrombus. Normal compressibility, respiratory phasicity and response to augmentation. Saphenofemoral Junction: No evidence of thrombus. Normal compressibility and flow on color Doppler imaging. Profunda Femoral Vein: No evidence of thrombus. Normal compressibility and flow on color Doppler imaging.  Femoral Vein: No evidence of thrombus. Normal compressibility, respiratory phasicity and response to augmentation. Popliteal Vein: No evidence of thrombus. Normal compressibility, respiratory phasicity and response to augmentation. Calf Veins: No evidence of thrombus. Normal compressibility and flow on color Doppler imaging. Limited visualization of the peroneal vein. Superficial Great Saphenous Vein: No evidence of thrombus. Normal compressibility and flow on color Doppler imaging. Venous Reflux:  None. Other Findings: Superficial subcutaneous edema in the calf and ankle. IMPRESSION: 1. Positive for nonocclusive DVT in the right popliteal vein in the form of eccentric wall thickening. Findings are most suggestive of partially recanalized chronic DVT. 2. No evidence of acute or chronic DVT on the left. 3. Superficial subcutaneous edema worse on the left than the right. 4. Bilateral superficial inguinal adenopathy is presumably reactive. Electronically Signed   By: Malachy Moan M.D.   On: 03/20/2016 14:33   I have  personally reviewed and evaluated these images and lab results as part of my medical decision-making.   EKG Interpretation None      MDM   Final diagnoses:  Left leg swelling   Pt presents with lower extremity swelling and pain L > R that has been worse for the last 2 days. LE dopplers showed nonocclusive DVT in R popliteal vein and no evidence of DVT in LLE. Pt maintaining oxygen saturation at rest. Bilateral ankle x-rays showed no acute fracture or dislocation but bilateral calcaneal spurring. She was unable to stand due to significant pain bearing weight with 2-person assist. Patient's daughter does not feel comfortable bringing patient home. Will admit for pain control and initiation of anticoagulation.   Dani Gobble, MD [facility] Family Medicine, PGY-2   [facility], MD 03/20/16 1745  Blane Ohara, MD 03/21/16 [phone]

## 2016-03-20 NOTE — ED Notes (Signed)
Pt reports LLE pain and swelling that began on Saturday and has gotten progressively worse - pt admits to chronic bilat lower extremity edema and takes lasix however her LLE has recently became painful and tender to the touch. Per family pt has had so much pain she is unable to move the extremity or ambulate. Pt denies any shortness of breath or chest pain - denies anticoagulant use at present. Pt A&Ox4, pleasant and cooperative, no acute distress. Family at bedside.

## 2016-03-21 LAB — COMPREHENSIVE METABOLIC PANEL
ALBUMIN: 3.3 g/dL — AB (ref 3.5–5.0)
ALK PHOS: 52 U/L (ref 38–126)
ALT: 7 U/L — AB (ref 14–54)
AST: 11 U/L — AB (ref 15–41)
Anion gap: 8 (ref 5–15)
BUN: 18 mg/dL (ref 6–20)
CALCIUM: 8.7 mg/dL — AB (ref 8.9–10.3)
CHLORIDE: 108 mmol/L (ref 101–111)
CO2: 27 mmol/L (ref 22–32)
CREATININE: 1.06 mg/dL — AB (ref 0.44–1.00)
GFR calc Af Amer: 52 mL/min — ABNORMAL LOW (ref >60)
GFR calc non Af Amer: 45 mL/min — ABNORMAL LOW (ref >60)
GLUCOSE: 94 mg/dL (ref 65–99)
Potassium: 3.5 mmol/L (ref 3.5–5.1)
SODIUM: 143 mmol/L (ref 135–145)
Total Bilirubin: 0.6 mg/dL (ref 0.3–1.2)
Total Protein: 6.4 g/dL — ABNORMAL LOW (ref 6.5–8.1)

## 2016-03-21 LAB — CBC
HCT: 30.4 % — ABNORMAL LOW (ref 36.0–46.0)
Hemoglobin: 10 g/dL — ABNORMAL LOW (ref 12.0–15.0)
MCH: 29.3 pg (ref 26.0–34.0)
MCHC: 32.9 g/dL (ref 30.0–36.0)
MCV: 89.1 fL (ref 78.0–100.0)
PLATELETS: 187 10*3/uL (ref 150–400)
RBC: 3.41 MIL/uL — AB (ref 3.87–5.11)
RDW: 13.4 % (ref 11.5–15.5)
WBC: 5.6 10*3/uL (ref 4.0–10.5)

## 2016-03-21 LAB — APTT: APTT: 90 s — AB (ref 24–37)

## 2016-03-21 LAB — PROTIME-INR
INR: 1.24 (ref 0.00–1.49)
PROTHROMBIN TIME: 15.8 s — AB (ref 11.6–15.2)

## 2016-03-21 LAB — HEPARIN LEVEL (UNFRACTIONATED): HEPARIN UNFRACTIONATED: 0.4 [IU]/mL (ref 0.30–0.70)

## 2016-03-21 MED ORDER — WARFARIN - PHARMACIST DOSING INPATIENT
Status: DC
  Administered 2016-03-21 – 2016-03-23 (×2)

## 2016-03-21 MED ORDER — WARFARIN SODIUM 5 MG PO TABS
5.0000 mg | ORAL_TABLET | Freq: Once | ORAL | Status: AC
  Administered 2016-03-21: 5 mg via ORAL
  Filled 2016-03-21: qty 1

## 2016-03-21 NOTE — Discharge Instructions (Signed)

## 2016-03-21 NOTE — Care Management Important Message (Signed)
Important Message  Patient Details  Name: [name] MRN: [id number] Date of Birth: August 14, 1925   Medicare Important Message Given:  Yes    Fuller Plan, RN 03/21/2016, 12:37 PM

## 2016-03-21 NOTE — Progress Notes (Signed)
Subjective: This is an 82 years old female was admitted from home due  pain and swelling of the lower extremities. Patient is found to have DVT of the right leg. Since patient has CKD 3 was started on heparin drip to bridge for coumadin therapy.   Objective  Temp:  [98.4 F (36.9 C)-99.1 F (37.3 C)] 98.5 F (36.9 C) (07/18 0405) Pulse Rate:  [60-73] 73 (07/18 0405) Resp:  [16-20] 20 (07/18 0405) BP: (114-162)/(49-84) 137/84 mmHg (07/18 0405) SpO2:  [97 %-100 %] 98 % (07/18 0405) Weight:  [64.411 kg (142 lb)] 64.411 kg (142 lb) (07/17 1106) Weight change:  Last BM Date: 03/19/16  Intake/Output from previous day:    PHYSICAL EXAM General appearance: alert and no distress Resp: clear to auscultation bilaterally Cardio: S1, S2 normal GI: soft, non-tender; bowel sounds normal; no masses,  no organomegaly Extremities: 2++ edema  Lab Results:  Results for orders placed or performed during the hospital encounter of 03/20/16 (from the past 48 hour(s))  I-Stat Chem 8, ED     Status: Abnormal   Collection Time: 03/20/16 12:52 PM  Result Value Ref Range   Sodium 144 135 - 145 mmol/L   Potassium 3.6 3.5 - 5.1 mmol/L   Chloride 107 101 - 111 mmol/L   BUN 20 6 - 20 mg/dL   Creatinine, Ser 1.30 (H) 0.44 - 1.00 mg/dL   Glucose, Bld 104 (H) 65 - 99 mg/dL   Calcium, Ion 1.17 1.12 - 1.23 mmol/L   TCO2 25 0 - 100 mmol/L   Hemoglobin 9.2 (L) 12.0 - 15.0 g/dL   HCT 27.0 (L) 36.0 - 46.0 %  Protime-INR     Status: None   Collection Time: 03/20/16 12:52 PM  Result Value Ref Range   Prothrombin Time 14.6 11.6 - 15.2 seconds   INR 1.12 0.00 - 1.49  CBC     Status: Abnormal   Collection Time: 03/20/16 12:52 PM  Result Value Ref Range   WBC 4.7 4.0 - 10.5 K/uL   RBC 3.25 (L) 3.87 - 5.11 MIL/uL   Hemoglobin 9.4 (L) 12.0 - 15.0 g/dL   HCT 29.4 (L) 36.0 - 46.0 %   MCV 90.5 78.0 - 100.0 fL   MCH 28.9 26.0 - 34.0 pg   MCHC 32.0 30.0 - 36.0 g/dL   RDW 13.6 11.5 - 15.5 %   Platelets 180 150 -  400 K/uL  APTT     Status: Abnormal   Collection Time: 03/20/16  9:15 PM  Result Value Ref Range   aPTT 199 (HH) 24 - 37 seconds    Comment:        IF BASELINE aPTT IS ELEVATED, SUGGEST PATIENT RISK ASSESSMENT BE USED TO DETERMINE APPROPRIATE ANTICOAGULANT THERAPY. REPEATED TO VERIFY CRITICAL RESULT CALLED TO, READ BACK BY AND VERIFIED WITH: GAVIN B AT 2219 ON [id number] BY FORSYTH K   Comprehensive metabolic panel     Status: Abnormal   Collection Time: 03/21/16  6:43 AM  Result Value Ref Range   Sodium 143 135 - 145 mmol/L   Potassium 3.5 3.5 - 5.1 mmol/L   Chloride 108 101 - 111 mmol/L   CO2 27 22 - 32 mmol/L   Glucose, Bld 94 65 - 99 mg/dL   BUN 18 6 - 20 mg/dL   Creatinine, Ser 1.06 (H) 0.44 - 1.00 mg/dL   Calcium 8.7 (L) 8.9 - 10.3 mg/dL   Total Protein 6.4 (L) 6.5 - 8.1 g/dL   Albumin 3.3 (L)  3.5 - 5.0 g/dL   AST 11 (L) 15 - 41 U/L   ALT 7 (L) 14 - 54 U/L   Alkaline Phosphatase 52 38 - 126 U/L   Total Bilirubin 0.6 0.3 - 1.2 mg/dL   GFR calc non Af Amer 45 (L) >60 mL/min   GFR calc Af Amer 52 (L) >60 mL/min    Comment: (NOTE) The eGFR has been calculated using the CKD EPI equation. This calculation has not been validated in all clinical situations. eGFR's persistently <60 mL/min signify possible Chronic Kidney Disease.    Anion gap 8 5 - 15  CBC     Status: Abnormal   Collection Time: 03/21/16  6:51 AM  Result Value Ref Range   WBC 5.6 4.0 - 10.5 K/uL   RBC 3.41 (L) 3.87 - 5.11 MIL/uL   Hemoglobin 10.0 (L) 12.0 - 15.0 g/dL   HCT 30.4 (L) 36.0 - 46.0 %   MCV 89.1 78.0 - 100.0 fL   MCH 29.3 26.0 - 34.0 pg   MCHC 32.9 30.0 - 36.0 g/dL   RDW 13.4 11.5 - 15.5 %   Platelets 187 150 - 400 K/uL  Protime-INR     Status: Abnormal   Collection Time: 03/21/16  6:51 AM  Result Value Ref Range   Prothrombin Time 15.8 (H) 11.6 - 15.2 seconds   INR 1.24 0.00 - 1.49    ABGS  Recent Labs  03/20/16 1252  TCO2 25   CULTURES No results found for this or any previous  visit (from the past 240 hour(s)). Studies/Results: Dg Ankle Complete Left  03/20/2016  CLINICAL DATA:  Pain and swelling EXAM: LEFT ANKLE COMPLETE - 3+ VIEW COMPARISON:  None. FINDINGS: Frontal, oblique, and lateral views were obtained. There is generalized soft tissue swelling. There is a questionable old avulsion along the medial malleolus. No acute fracture is evident. No joint effusion. The ankle mortise appears intact. There are spurs arising from the posterior inferior calcaneus. Joint spaces appear normal. There are scattered foci of arterial vascular calcification in the anterior tibial artery. IMPRESSION: Generalized soft tissue swelling. Question old injury medial malleolus. No acute fracture. Ankle mortise appears intact. There are calcaneal spurs. There is atherosclerotic calcification in the anterior tibial artery. Electronically Signed   By: Lowella Grip III M.D.   On: 03/20/2016 15:56   Dg Ankle Complete Right  03/20/2016  CLINICAL DATA:  Bilateral ankle pain and swelling, no known injury EXAM: RIGHT ANKLE - COMPLETE 3+ VIEW COMPARISON:  None. FINDINGS: Three views of the right ankle submitted no acute fracture or subluxation. There is diffuse soft tissue swelling around the ankle. Mild spurring of distal tibia medial malleolus. Ankle mortise is preserved. Plantar spurring and posterior spurring of calcaneus. IMPRESSION: No acute fracture or subluxation. Diffuse soft tissue swelling around the ankle. Plantar and posterior spurring of calcaneus Electronically Signed   By: Lahoma Crocker M.D.   On: 03/20/2016 15:51   US Venous Img Lower Bilateral  03/20/2016  CLINICAL DATA:  82 year old female with chronic bilateral lower extremity swelling recently worse on the left. EXAM: BILATERAL LOWER EXTREMITY VENOUS DOPPLER ULTRASOUND TECHNIQUE: Gray-scale sonography with graded compression, as well as color Doppler and duplex ultrasound were performed to evaluate the lower extremity deep venous  systems from the level of the common femoral vein and including the common femoral, femoral, profunda femoral, popliteal and calf veins including the posterior tibial, peroneal and gastrocnemius veins when visible. The superficial great saphenous vein was also interrogated.  Spectral Doppler was utilized to evaluate flow at rest and with distal augmentation maneuvers in the common femoral, femoral and popliteal veins. COMPARISON:  None. FINDINGS: RIGHT LOWER EXTREMITY Common Femoral Vein: No evidence of thrombus. Normal compressibility, respiratory phasicity and response to augmentation. Saphenofemoral Junction: No evidence of thrombus. Normal compressibility and flow on color Doppler imaging. Profunda Femoral Vein: No evidence of thrombus. Normal compressibility and flow on color Doppler imaging. Femoral Vein: No evidence of thrombus. Normal compressibility, respiratory phasicity and response to augmentation. Popliteal Vein: Partial compressibility of the popliteal vein secondary to eccentric echogenic wall thickening. Color flow is present within the vessel lumen. Calf Veins: No evidence of thrombus. Normal compressibility and flow on color Doppler imaging. Limited visualization of the peroneal vein. Superficial Great Saphenous Vein: No evidence of thrombus. Normal compressibility and flow on color Doppler imaging. Venous Reflux:  None. Other Findings:  Superficial subcutaneous edema at the ankle. LEFT LOWER EXTREMITY Common Femoral Vein: No evidence of thrombus. Normal compressibility, respiratory phasicity and response to augmentation. Saphenofemoral Junction: No evidence of thrombus. Normal compressibility and flow on color Doppler imaging. Profunda Femoral Vein: No evidence of thrombus. Normal compressibility and flow on color Doppler imaging. Femoral Vein: No evidence of thrombus. Normal compressibility, respiratory phasicity and response to augmentation. Popliteal Vein: No evidence of thrombus. Normal  compressibility, respiratory phasicity and response to augmentation. Calf Veins: No evidence of thrombus. Normal compressibility and flow on color Doppler imaging. Limited visualization of the peroneal vein. Superficial Great Saphenous Vein: No evidence of thrombus. Normal compressibility and flow on color Doppler imaging. Venous Reflux:  None. Other Findings: Superficial subcutaneous edema in the calf and ankle. IMPRESSION: 1. Positive for nonocclusive DVT in the right popliteal vein in the form of eccentric wall thickening. Findings are most suggestive of partially recanalized chronic DVT. 2. No evidence of acute or chronic DVT on the left. 3. Superficial subcutaneous edema worse on the left than the right. 4. Bilateral superficial inguinal adenopathy is presumably reactive. Electronically Signed   By: Jacqulynn Cadet M.D.   On: 03/20/2016 14:33    Medications: I have reviewed the patient's current medications.  Assesment:   Active Problems:   Hypothyroidism   Anemia   DVT (deep venous thrombosis) (HCC)   CKD (chronic kidney disease) stage 3, GFR 30-59 ml/min   Hyperglycemia    Plan:  Medications reviewed Continue heparin Continue coumadin  Will monitor PT/INR, CBC/BMP Continue regular treatment    LOS: 1 day   , 03/21/2016, 8:08 AM

## 2016-03-21 NOTE — Progress Notes (Signed)
ANTICOAGULATION CONSULT NOTE - follow up  Pharmacy Consult for heparin and coumadin Indication: DVT  Allergies  Allergen Reactions  . Aspirin Other (See Comments)    unknown   Patient Measurements: Height: 5' (152.4 cm) Weight: 142 lb (64.411 kg) IBW/kg (Calculated) : 45.5 HEPARIN DW (KG): 59.1  Vital Signs: Temp: 98.5 F (36.9 C) (07/18 0405) Temp Source: Oral (07/18 0405) BP: 149/52 mmHg (07/18 0900) Pulse Rate: 70 (07/18 0900)  Labs:  Recent Labs  03/20/16 1252 03/20/16 2115 03/21/16 0643 03/21/16 0651 03/21/16 1036  HGB 9.4*  9.2*  --   --  10.0*  --   HCT 29.4*  27.0*  --   --  30.4*  --   PLT 180  --   --  187  --   APTT  --  199*  --   --  90*  LABPROT 14.6  --   --  15.8*  --   INR 1.12  --   --  1.24  --   HEPARINUNFRC  --   --   --  0.40  --   CREATININE 1.30*  --  1.06*  --   --    Estimated Creatinine Clearance: 30.2 mL/min (by C-G formula based on Cr of 1.06).  Medical History: Past Medical History  Diagnosis Date  . Cardiomyopathy     Nonischemic, EF 25-30% 2007 / EF improved 50% 2008, ( catheterization not done) nuclear 2007, fixed anteroapical defect, no ischemia  . Ejection fraction     EF 50%, echo, 2008  . Hypertension   . Pulmonary hypertension (HCC)   . ASD (atrial septal defect)     Small  . Edema     Chronic lower extremity edema... venous insufficiency  . Hypothyroidism   . Stroke (HCC)   . Diverticulitis   . Gout   . CKD (chronic kidney disease)   . Chronic back pain greater than 3 months duration   . Anemia   . Multilevel degenerative disc disease    Medications:  Prescriptions prior to admission  Medication Sig Dispense Refill Last Dose  . ALPRAZolam (XANAX) 0.5 MG tablet Take 0.5 mg by mouth 2 (two) times daily.   03/20/2016 at Unknown time  . carvedilol (COREG) 25 MG tablet Take 25 mg by mouth 2 (two) times daily with a meal.    03/20/2016 at 0900  . furosemide (LASIX) 20 MG tablet Take 1 tablet (20 mg total) by mouth  daily. (Patient taking differently: Take 20 mg by mouth 2 (two) times daily. ) 30 tablet 3 03/19/2016 at Unknown time  . gabapentin (NEURONTIN) 100 MG capsule Take 100 mg by mouth 2 (two) times daily.    03/20/2016 at Unknown time  . levothyroxine (SYNTHROID, LEVOTHROID) 75 MCG tablet Take 75 mcg by mouth daily.     03/20/2016 at Unknown time  . oxybutynin (DITROPAN) 5 MG tablet Take 5 mg by mouth daily.   03/20/2016 at Unknown time  . pantoprazole (PROTONIX) 40 MG tablet Take 1 tablet (40 mg total) by mouth 2 (two) times daily. (Patient taking differently: Take 40 mg by mouth daily. ) 30 tablet 3 03/20/2016 at Unknown time  . potassium chloride SA (K-DUR,KLOR-CON) 20 MEQ tablet Take 20 mEq by mouth daily.    03/20/2016 at Unknown time  . cephALEXin (KEFLEX) 500 MG capsule Take 1 capsule (500 mg total) by mouth 3 (three) times daily. (Patient not taking: Reported on 03/20/2016) 30 capsule 0 Not Taking at Unknown time  Assessment: 82 y.o. female, w Hypertension, CKD stage 3, Hypothyroidism, Cardiomyopathy, Pulmonary htn, apparently presents with c/o lower ext edema worse for the past 2 -3 days. Pt has pain in the feet due to swelling. Pt unable to walk due to leg swelling. Ultrasound shows DVT. No hx of DVT.  Pt will be admitted for DVT and anticoagulation with Heparin and Coumadin. Heparin level is at goal.  Goal of Therapy:  Heparin level 0.3-0.7  INR 2-3 Monitor platelets by anticoagulation protocol: Yes   Plan:  Coumadin  po x 1 today Continue Heparin infusion at 950 units/hr Monitor INR, CBC, Heparin level  Valrie Hart, PharmD Clinical Pharmacist Pager:  [phone] 03/21/2016 12:42 PM

## 2016-03-22 LAB — BASIC METABOLIC PANEL
ANION GAP: 9 (ref 5–15)
BUN: 20 mg/dL (ref 6–20)
CHLORIDE: 106 mmol/L (ref 101–111)
CO2: 27 mmol/L (ref 22–32)
Calcium: 8.7 mg/dL — ABNORMAL LOW (ref 8.9–10.3)
Creatinine, Ser: 1.23 mg/dL — ABNORMAL HIGH (ref 0.44–1.00)
GFR calc non Af Amer: 38 mL/min — ABNORMAL LOW (ref >60)
GFR, EST AFRICAN AMERICAN: 44 mL/min — AB (ref >60)
Glucose, Bld: 114 mg/dL — ABNORMAL HIGH (ref 65–99)
POTASSIUM: 3.4 mmol/L — AB (ref 3.5–5.1)
SODIUM: 142 mmol/L (ref 135–145)

## 2016-03-22 LAB — PROTIME-INR
INR: 1.24 (ref 0.00–1.49)
Prothrombin Time: 15.8 seconds — ABNORMAL HIGH (ref 11.6–15.2)

## 2016-03-22 LAB — CBC
HCT: 31.3 % — ABNORMAL LOW (ref 36.0–46.0)
HEMOGLOBIN: 10.2 g/dL — AB (ref 12.0–15.0)
MCH: 29.1 pg (ref 26.0–34.0)
MCHC: 32.6 g/dL (ref 30.0–36.0)
MCV: 89.4 fL (ref 78.0–100.0)
Platelets: 206 10*3/uL (ref 150–400)
RBC: 3.5 MIL/uL — AB (ref 3.87–5.11)
RDW: 13.4 % (ref 11.5–15.5)
WBC: 6.4 10*3/uL (ref 4.0–10.5)

## 2016-03-22 LAB — HEPARIN LEVEL (UNFRACTIONATED): Heparin Unfractionated: 0.35 IU/mL (ref 0.30–0.70)

## 2016-03-22 MED ORDER — LORAZEPAM 0.5 MG PO TABS
0.5000 mg | ORAL_TABLET | Freq: Two times a day (BID) | ORAL | Status: DC | PRN
  Administered 2016-03-22: 0.5 mg via ORAL
  Filled 2016-03-22: qty 1

## 2016-03-22 MED ORDER — WARFARIN SODIUM 5 MG PO TABS
5.0000 mg | ORAL_TABLET | Freq: Once | ORAL | Status: AC
Start: 2016-03-22 — End: 2016-03-22
  Administered 2016-03-22: 5 mg via ORAL
  Filled 2016-03-22 (×2): qty 1

## 2016-03-22 MED ORDER — WARFARIN VIDEO
Freq: Once | Status: DC
Start: 2016-03-22 — End: 2016-03-24

## 2016-03-22 NOTE — Clinical Social Work Note (Signed)
Patient has [employer] which requires a skilled need, which patient does not have. Patient was referred to HHPT and RNCM is aware.  CSW left a message for patient's daughter to discuss.      [name], [name] China, LCSW

## 2016-03-22 NOTE — Progress Notes (Signed)
Pt refused vital signs check. Pt also refused to return to bed. Family and MD notified. Will continue to monitor.

## 2016-03-22 NOTE — Care Management (Signed)
Called patient family to discuss home health. Left message.

## 2016-03-22 NOTE — Evaluation (Signed)
Physical Therapy Evaluation Patient Details Name: [name] MRN: [id number] DOB: 1925-05-05 Today's Date: 03/22/2016   History of Present Illness  82 yo F admitted 03/20/2016 with LE swelling (+) nonocclusive DVT R popliteal vein in the form of eccentric wall thickening. (-) DVT L LE. Now on heparin drip. PMH: HTN, CKD stage 3, hypothyroidism, cardiomyopathy, pulmonary HTN, atrial septal defect, hypothyroidism, CVA, diverticulitis, chronic back pain, anemia, multilevel DDD.  Clinical Impression  Pt received in sitting up in the chair, and was agreeable to PT evaluation.  No family present during evaluation, and pt is not a reliable historian.  Pt states she is modified independent with gait and a cane, and that she is independent with bathing/dressing - however previous note from ~36yr ago states that her dtr assists with ADL's.  Today, pt required Min A for sit<>stand and Min guard for ambulation with rollator x 125ft.  She is recommended for continued 24/7 supervision/assistance, and HHPT to improve transfers and strength.     Follow Up Recommendations Home health PT    Equipment Recommendations  None recommended by PT    Recommendations for Other Services       Precautions / Restrictions Precautions Precautions: Fall Precaution Comments: due to decreased mobility and gait speed.  Restrictions Weight Bearing Restrictions: No      Mobility  Bed Mobility Overal bed mobility:  (Not assessed - pt already sitting up in the chair upon arrival. )                Transfers Overall transfer level: Needs assistance Equipment used: 4-wheeled walker Transfers: Sit to/from Stand Sit to Stand: Min assist            Ambulation/Gait Ambulation/Gait assistance: Min guard Ambulation Distance (Feet): 100 Feet Assistive device: 4-wheeled walker Gait Pattern/deviations: Trunk flexed;Shuffle   Gait velocity interpretation: <1.8 ft/sec, indicative of risk for recurrent  falls General Gait Details: Pt does not demonstrate any LOB, and states that her mobility is at baseline.   Stairs            Wheelchair Mobility    Modified Rankin (Stroke Patients Only)       Balance Overall balance assessment: Modified Independent                                           Pertinent Vitals/Pain Pain Assessment: No/denies pain    Home Living   Living Arrangements: Children (Pt is staying with her daughter. ) Available Help at Discharge:  (seldom do others come help her. ) Type of Home: House Home Access: Stairs to enter Entrance Stairs-Rails: Left Entrance Stairs-Number of Steps: 2 in the back and 3 on the front.   Pt normally goes in the back Home Layout: One level Home Equipment: Walker - 4 wheels;Other (comment);Bedside commode;Tub bench;Grab bars - tub/shower;Wheelchair - manual (orthopedic shoes)      Prior Function Level of Independence: Needs assistance   Gait / Transfers Assistance Needed: Pt states she normally uses the cane for ambulation, but she can't find it for ~1 week.   ADL's / Homemaking Assistance Needed: Pt states that she can get dressed and bathed on her own, but it takes awhile.  Notes from 1 year ago report that her dtr assists with dressing and bathing.  No family present to confirm.         Hand Dominance  Dominant Hand: Right    Extremity/Trunk Assessment   Upper Extremity Assessment: Overall WFL for tasks assessed           Lower Extremity Assessment: Generalized weakness         Communication   Communication: Other (comment) (tangental with conversation.  Does not answer questions directly)  Cognition Arousal/Alertness: Awake/alert Behavior During Therapy: WFL for tasks assessed/performed Overall Cognitive Status: Impaired/Different from baseline Area of Impairment: Orientation Orientation Level: Disoriented to;Place;Time;Situation (States that we are at church.  States she is here  because she didn't want to cook dinner. )   Memory: Decreased short-term memory              General Comments      Exercises        Assessment/Plan    PT Assessment Patient needs continued PT services  PT Diagnosis Generalized weakness   PT Problem List Decreased strength;Decreased activity tolerance;Decreased balance;Decreased mobility;Decreased coordination;Decreased cognition;Decreased knowledge of use of DME;Decreased safety awareness;Decreased knowledge of precautions;Cardiopulmonary status limiting activity  PT Treatment Interventions DME instruction;Gait training;Functional mobility training;Therapeutic activities;Stair training;Therapeutic exercise;Balance training;Patient/family education   PT Goals (Current goals can be found in the Care Plan section) Acute Rehab PT Goals PT Goal Formulation: Patient unable to participate in goal setting Time For Goal Achievement: 03/29/16 Potential to Achieve Goals: Good    Frequency Min 2X/week   Barriers to discharge        Co-evaluation               End of Session Equipment Utilized During Treatment: Gait belt Activity Tolerance: Patient tolerated treatment well Patient left: in chair;with call bell/phone within reach;with nursing/sitter in room Nurse Communication: Mobility status    Functional Assessment Tool Used: [employer] "6-clicks"  Functional Limitation: Mobility: Walking and moving around Mobility: Walking and Moving Around Current Status [phone]): At least 40 percent but less than 60 percent impaired, limited or restricted Mobility: Walking and Moving Around Goal Status [phone]): At least 20 percent but less than 40 percent impaired, limited or restricted    Time: 1110-1140 PT Time Calculation (min) (ACUTE ONLY): 30 min   Charges:   PT Evaluation $PT Eval Low Complexity: 1 Procedure PT Treatments $Gait Training: 8-22 mins   PT G Codes:   PT G-Codes **NOT FOR INPATIENT  CLASS** Functional Assessment Tool Used: [employer] "6-clicks"  Functional Limitation: Mobility: Walking and moving around Mobility: Walking and Moving Around Current Status [phone]): At least 40 percent but less than 60 percent impaired, limited or restricted Mobility: Walking and Moving Around Goal Status [phone]): At least 20 percent but less than 40 percent impaired, limited or restricted   Beth Perrin Eddleman, PT, DPT X: [phone]

## 2016-03-22 NOTE — Progress Notes (Signed)
Subjective: Patient is on heparin drip and also started on coumadin. PT/INR is not yet therapeutic. Patient was confused and agitated last night. I have talked to her daughter who is her care taker and she wants her placed for rehab. She wants her to be able to ambulate to come back home, otherwise she will not be able to care for her.   Objective  Temp:  [98.5 F (36.9 C)-99.3 F (37.4 C)] 99.1 F (37.3 C) (07/18 2140) Pulse Rate:  [69-78] 76 (07/18 2140) Resp:  [20] 20 (07/18 2140) BP: (115-149)/(48-65) 147/49 mmHg (07/18 2140) SpO2:  [95 %-100 %] 95 % (07/18 2140) Weight change:  Last BM Date: 03/21/16  Intake/Output from previous day: 07/18 0701 - 07/19 0700 In: 480 [P.O.:480] Out: 600 [Urine:600]  PHYSICAL EXAM General appearance: alert and no distress Resp: clear to auscultation bilaterally Cardio: S1, S2 normal GI: soft, non-tender; bowel sounds normal; no masses,  no organomegaly Extremities: 2++ edema  Lab Results:  Results for orders placed or performed during the hospital encounter of 03/20/16 (from the past 48 hour(s))  I-Stat Chem 8, ED     Status: Abnormal   Collection Time: 03/20/16 12:52 PM  Result Value Ref Range   Sodium 144 135 - 145 mmol/L   Potassium 3.6 3.5 - 5.1 mmol/L   Chloride 107 101 - 111 mmol/L   BUN 20 6 - 20 mg/dL   Creatinine, Ser 1.30 (H) 0.44 - 1.00 mg/dL   Glucose, Bld 104 (H) 65 - 99 mg/dL   Calcium, Ion 1.17 1.12 - 1.23 mmol/L   TCO2 25 0 - 100 mmol/L   Hemoglobin 9.2 (L) 12.0 - 15.0 g/dL   HCT 27.0 (L) 36.0 - 46.0 %  Protime-INR     Status: None   Collection Time: 03/20/16 12:52 PM  Result Value Ref Range   Prothrombin Time 14.6 11.6 - 15.2 seconds   INR 1.12 0.00 - 1.49  CBC     Status: Abnormal   Collection Time: 03/20/16 12:52 PM  Result Value Ref Range   WBC 4.7 4.0 - 10.5 K/uL   RBC 3.25 (L) 3.87 - 5.11 MIL/uL   Hemoglobin 9.4 (L) 12.0 - 15.0 g/dL   HCT 29.4 (L) 36.0 - 46.0 %   MCV 90.5 78.0 - 100.0 fL   MCH 28.9 26.0  - 34.0 pg   MCHC 32.0 30.0 - 36.0 g/dL   RDW 13.6 11.5 - 15.5 %   Platelets 180 150 - 400 K/uL  APTT     Status: Abnormal   Collection Time: 03/20/16  9:15 PM  Result Value Ref Range   aPTT 199 (HH) 24 - 37 seconds    Comment:        IF BASELINE aPTT IS ELEVATED, SUGGEST PATIENT RISK ASSESSMENT BE USED TO DETERMINE APPROPRIATE ANTICOAGULANT THERAPY. REPEATED TO VERIFY CRITICAL RESULT CALLED TO, READ BACK BY AND VERIFIED WITH: GAVIN B AT 2219 ON [id number] BY FORSYTH K   Comprehensive metabolic panel     Status: Abnormal   Collection Time: 03/21/16  6:43 AM  Result Value Ref Range   Sodium 143 135 - 145 mmol/L   Potassium 3.5 3.5 - 5.1 mmol/L   Chloride 108 101 - 111 mmol/L   CO2 27 22 - 32 mmol/L   Glucose, Bld 94 65 - 99 mg/dL   BUN 18 6 - 20 mg/dL   Creatinine, Ser 1.06 (H) 0.44 - 1.00 mg/dL   Calcium 8.7 (L) 8.9 - 10.3 mg/dL  Total Protein 6.4 (L) 6.5 - 8.1 g/dL   Albumin 3.3 (L) 3.5 - 5.0 g/dL   AST 11 (L) 15 - 41 U/L   ALT 7 (L) 14 - 54 U/L   Alkaline Phosphatase 52 38 - 126 U/L   Total Bilirubin 0.6 0.3 - 1.2 mg/dL   GFR calc non Af Amer 45 (L) >60 mL/min   GFR calc Af Amer 52 (L) >60 mL/min    Comment: (NOTE) The eGFR has been calculated using the CKD EPI equation. This calculation has not been validated in all clinical situations. eGFR's persistently <60 mL/min signify possible Chronic Kidney Disease.    Anion gap 8 5 - 15  CBC     Status: Abnormal   Collection Time: 03/21/16  6:51 AM  Result Value Ref Range   WBC 5.6 4.0 - 10.5 K/uL   RBC 3.41 (L) 3.87 - 5.11 MIL/uL   Hemoglobin 10.0 (L) 12.0 - 15.0 g/dL   HCT 30.4 (L) 36.0 - 46.0 %   MCV 89.1 78.0 - 100.0 fL   MCH 29.3 26.0 - 34.0 pg   MCHC 32.9 30.0 - 36.0 g/dL   RDW 13.4 11.5 - 15.5 %   Platelets 187 150 - 400 K/uL  Protime-INR     Status: Abnormal   Collection Time: 03/21/16  6:51 AM  Result Value Ref Range   Prothrombin Time 15.8 (H) 11.6 - 15.2 seconds   INR 1.24 0.00 - 1.49  Heparin level  (unfractionated)     Status: None   Collection Time: 03/21/16  6:51 AM  Result Value Ref Range   Heparin Unfractionated 0.40 0.30 - 0.70 IU/mL    Comment:        IF HEPARIN RESULTS ARE BELOW EXPECTED VALUES, AND PATIENT DOSAGE HAS BEEN CONFIRMED, SUGGEST FOLLOW UP TESTING OF ANTITHROMBIN III LEVELS. Performed at [facility]   APTT     Status: Abnormal   Collection Time: 03/21/16 10:36 AM  Result Value Ref Range   aPTT 90 (H) 24 - 37 seconds    Comment:        IF BASELINE aPTT IS ELEVATED, SUGGEST PATIENT RISK ASSESSMENT BE USED TO DETERMINE APPROPRIATE ANTICOAGULANT THERAPY.   CBC     Status: Abnormal   Collection Time: 03/22/16  4:20 AM  Result Value Ref Range   WBC 6.4 4.0 - 10.5 K/uL   RBC 3.50 (L) 3.87 - 5.11 MIL/uL   Hemoglobin 10.2 (L) 12.0 - 15.0 g/dL   HCT 31.3 (L) 36.0 - 46.0 %   MCV 89.4 78.0 - 100.0 fL   MCH 29.1 26.0 - 34.0 pg   MCHC 32.6 30.0 - 36.0 g/dL   RDW 13.4 11.5 - 15.5 %   Platelets 206 150 - 400 K/uL  Protime-INR     Status: Abnormal   Collection Time: 03/22/16  4:20 AM  Result Value Ref Range   Prothrombin Time 15.8 (H) 11.6 - 15.2 seconds   INR 1.24 0.00 - 1.49  Heparin level (unfractionated)     Status: None   Collection Time: 03/22/16  4:20 AM  Result Value Ref Range   Heparin Unfractionated 0.35 0.30 - 0.70 IU/mL    Comment:        IF HEPARIN RESULTS ARE BELOW EXPECTED VALUES, AND PATIENT DOSAGE HAS BEEN CONFIRMED, SUGGEST FOLLOW UP TESTING OF ANTITHROMBIN III LEVELS.   Basic metabolic panel     Status: Abnormal   Collection Time: 03/22/16  4:20 AM  Result Value Ref  Range   Sodium 142 135 - 145 mmol/L   Potassium 3.4 (L) 3.5 - 5.1 mmol/L   Chloride 106 101 - 111 mmol/L   CO2 27 22 - 32 mmol/L   Glucose, Bld 114 (H) 65 - 99 mg/dL   BUN 20 6 - 20 mg/dL   Creatinine, Ser 1.23 (H) 0.44 - 1.00 mg/dL   Calcium 8.7 (L) 8.9 - 10.3 mg/dL   GFR calc non Af Amer 38 (L) >60 mL/min   GFR calc Af Amer 44 (L) >60 mL/min    Comment:  (NOTE) The eGFR has been calculated using the CKD EPI equation. This calculation has not been validated in all clinical situations. eGFR's persistently <60 mL/min signify possible Chronic Kidney Disease.    Anion gap 9 5 - 15    ABGS  Recent Labs  03/20/16 1252  TCO2 25   CULTURES No results found for this or any previous visit (from the past 240 hour(s)). Studies/Results: Dg Ankle Complete Left  03/20/2016  CLINICAL DATA:  Pain and swelling EXAM: LEFT ANKLE COMPLETE - 3+ VIEW COMPARISON:  None. FINDINGS: Frontal, oblique, and lateral views were obtained. There is generalized soft tissue swelling. There is a questionable old avulsion along the medial malleolus. No acute fracture is evident. No joint effusion. The ankle mortise appears intact. There are spurs arising from the posterior inferior calcaneus. Joint spaces appear normal. There are scattered foci of arterial vascular calcification in the anterior tibial artery. IMPRESSION: Generalized soft tissue swelling. Question old injury medial malleolus. No acute fracture. Ankle mortise appears intact. There are calcaneal spurs. There is atherosclerotic calcification in the anterior tibial artery. Electronically Signed   By: Lowella Grip III M.D.   On: 03/20/2016 15:56   Dg Ankle Complete Right  03/20/2016  CLINICAL DATA:  Bilateral ankle pain and swelling, no known injury EXAM: RIGHT ANKLE - COMPLETE 3+ VIEW COMPARISON:  None. FINDINGS: Three views of the right ankle submitted no acute fracture or subluxation. There is diffuse soft tissue swelling around the ankle. Mild spurring of distal tibia medial malleolus. Ankle mortise is preserved. Plantar spurring and posterior spurring of calcaneus. IMPRESSION: No acute fracture or subluxation. Diffuse soft tissue swelling around the ankle. Plantar and posterior spurring of calcaneus Electronically Signed   By: Lahoma Crocker M.D.   On: 03/20/2016 15:51   US Venous Img Lower Bilateral  03/20/2016   CLINICAL DATA:  82 year old female with chronic bilateral lower extremity swelling recently worse on the left. EXAM: BILATERAL LOWER EXTREMITY VENOUS DOPPLER ULTRASOUND TECHNIQUE: Gray-scale sonography with graded compression, as well as color Doppler and duplex ultrasound were performed to evaluate the lower extremity deep venous systems from the level of the common femoral vein and including the common femoral, femoral, profunda femoral, popliteal and calf veins including the posterior tibial, peroneal and gastrocnemius veins when visible. The superficial great saphenous vein was also interrogated. Spectral Doppler was utilized to evaluate flow at rest and with distal augmentation maneuvers in the common femoral, femoral and popliteal veins. COMPARISON:  None. FINDINGS: RIGHT LOWER EXTREMITY Common Femoral Vein: No evidence of thrombus. Normal compressibility, respiratory phasicity and response to augmentation. Saphenofemoral Junction: No evidence of thrombus. Normal compressibility and flow on color Doppler imaging. Profunda Femoral Vein: No evidence of thrombus. Normal compressibility and flow on color Doppler imaging. Femoral Vein: No evidence of thrombus. Normal compressibility, respiratory phasicity and response to augmentation. Popliteal Vein: Partial compressibility of the popliteal vein secondary to eccentric echogenic wall thickening. Color flow  is present within the vessel lumen. Calf Veins: No evidence of thrombus. Normal compressibility and flow on color Doppler imaging. Limited visualization of the peroneal vein. Superficial Great Saphenous Vein: No evidence of thrombus. Normal compressibility and flow on color Doppler imaging. Venous Reflux:  None. Other Findings:  Superficial subcutaneous edema at the ankle. LEFT LOWER EXTREMITY Common Femoral Vein: No evidence of thrombus. Normal compressibility, respiratory phasicity and response to augmentation. Saphenofemoral Junction: No evidence of thrombus.  Normal compressibility and flow on color Doppler imaging. Profunda Femoral Vein: No evidence of thrombus. Normal compressibility and flow on color Doppler imaging. Femoral Vein: No evidence of thrombus. Normal compressibility, respiratory phasicity and response to augmentation. Popliteal Vein: No evidence of thrombus. Normal compressibility, respiratory phasicity and response to augmentation. Calf Veins: No evidence of thrombus. Normal compressibility and flow on color Doppler imaging. Limited visualization of the peroneal vein. Superficial Great Saphenous Vein: No evidence of thrombus. Normal compressibility and flow on color Doppler imaging. Venous Reflux:  None. Other Findings: Superficial subcutaneous edema in the calf and ankle. IMPRESSION: 1. Positive for nonocclusive DVT in the right popliteal vein in the form of eccentric wall thickening. Findings are most suggestive of partially recanalized chronic DVT. 2. No evidence of acute or chronic DVT on the left. 3. Superficial subcutaneous edema worse on the left than the right. 4. Bilateral superficial inguinal adenopathy is presumably reactive. Electronically Signed   By: Jacqulynn Cadet M.D.   On: 03/20/2016 14:33    Medications: I have reviewed the patient's current medications.  Assesment:   Active Problems:   Hypothyroidism   Anemia   DVT (deep venous thrombosis) (HCC)   CKD (chronic kidney disease) stage 3, GFR 30-59 ml/min   Hyperglycemia    Plan:  Medications reviewed Continue heparin Continue coumadin  Physical therapy Social worker evaluation Continue regular treatment    LOS: 2 days   Shaniquia Brafford 03/22/2016, 7:58 AM

## 2016-03-22 NOTE — Progress Notes (Signed)
ANTICOAGULATION CONSULT NOTE - follow up  Pharmacy Consult for heparin and coumadin Indication: DVT  Allergies  Allergen Reactions  . Aspirin Other (See Comments)    unknown   Patient Measurements: Height: 5' (152.4 cm) Weight: 142 lb (64.411 kg) IBW/kg (Calculated) : 45.5 HEPARIN DW (KG): 59.1  Vital Signs: Temp: 99.1 F (37.3 C) (07/18 2140) Temp Source: Oral (07/18 2140) BP: 147/49 mmHg (07/18 2140) Pulse Rate: 76 (07/18 2140)  Labs:  Recent Labs  03/20/16 1252 03/20/16 2115 03/21/16 0643 03/21/16 0651 03/21/16 1036 03/22/16 0420  HGB 9.4*  9.2*  --   --  10.0*  --  10.2*  HCT 29.4*  27.0*  --   --  30.4*  --  31.3*  PLT 180  --   --  187  --  206  APTT  --  199*  --   --  90*  --   LABPROT 14.6  --   --  15.8*  --  15.8*  INR 1.12  --   --  1.24  --  1.24  HEPARINUNFRC  --   --   --  0.40  --  0.35  CREATININE 1.30*  --  1.06*  --   --  1.23*   Estimated Creatinine Clearance: 26 mL/min (by C-G formula based on Cr of 1.23).  Medical History: Past Medical History  Diagnosis Date  . Cardiomyopathy     Nonischemic, EF 25-30% 2007 / EF improved 50% 2008, ( catheterization not done) nuclear 2007, fixed anteroapical defect, no ischemia  . Ejection fraction     EF 50%, echo, 2008  . Hypertension   . Pulmonary hypertension (HCC)   . ASD (atrial septal defect)     Small  . Edema     Chronic lower extremity edema... venous insufficiency  . Hypothyroidism   . Stroke (HCC)   . Diverticulitis   . Gout   . CKD (chronic kidney disease)   . Chronic back pain greater than 3 months duration   . Anemia   . Multilevel degenerative disc disease    Medications:  Prescriptions prior to admission  Medication Sig Dispense Refill Last Dose  . ALPRAZolam (XANAX) 0.5 MG tablet Take 0.5 mg by mouth 2 (two) times daily.   03/20/2016 at Unknown time  . carvedilol (COREG) 25 MG tablet Take 25 mg by mouth 2 (two) times daily with a meal.    03/20/2016 at 0900  . furosemide  (LASIX) 20 MG tablet Take 1 tablet (20 mg total) by mouth daily. (Patient taking differently: Take 20 mg by mouth 2 (two) times daily. ) 30 tablet 3 03/19/2016 at Unknown time  . gabapentin (NEURONTIN) 100 MG capsule Take 100 mg by mouth 2 (two) times daily.    03/20/2016 at Unknown time  . levothyroxine (SYNTHROID, LEVOTHROID) 75 MCG tablet Take 75 mcg by mouth daily.     03/20/2016 at Unknown time  . oxybutynin (DITROPAN) 5 MG tablet Take 5 mg by mouth daily.   03/20/2016 at Unknown time  . pantoprazole (PROTONIX) 40 MG tablet Take 1 tablet (40 mg total) by mouth 2 (two) times daily. (Patient taking differently: Take 40 mg by mouth daily. ) 30 tablet 3 03/20/2016 at Unknown time  . potassium chloride SA (K-DUR,KLOR-CON) 20 MEQ tablet Take 20 mEq by mouth daily.    03/20/2016 at Unknown time  . cephALEXin (KEFLEX) 500 MG capsule Take 1 capsule (500 mg total) by mouth 3 (three) times daily. (Patient not taking:  Reported on 03/20/2016) 30 capsule 0 Not Taking at Unknown time   Assessment: 82 y.o. female, w Hypertension, CKD stage 3, Hypothyroidism, Cardiomyopathy, Pulmonary htn, apparently presents with c/o lower ext edema worse for the past 2 -3 days. Pt has pain in the feet due to swelling. Pt unable to walk due to leg swelling. Ultrasound shows DVT. No hx of DVT.  Pt will be admitted for DVT and anticoagulation with Heparin and Coumadin. Heparin level is at goal today 0.35. INR 1.24  Goal of Therapy:  Heparin level 0.3-0.7  INR 2-3 Monitor platelets by anticoagulation protocol: Yes   Plan:  Coumadin  po x 1 today Continue Heparin infusion at 950 units/hr Monitor INR, CBC, Heparin level  Elder Cyphers, BS Loura Back, [place] Clinical Pharmacist Pager [phone] 03/22/2016 8:07 AM

## 2016-03-23 LAB — PROTIME-INR
INR: 1.42 (ref 0.00–1.49)
Prothrombin Time: 17.4 seconds — ABNORMAL HIGH (ref 11.6–15.2)

## 2016-03-23 LAB — CBC
HEMATOCRIT: 29.1 % — AB (ref 36.0–46.0)
HEMOGLOBIN: 9.4 g/dL — AB (ref 12.0–15.0)
MCH: 28.7 pg (ref 26.0–34.0)
MCHC: 32.3 g/dL (ref 30.0–36.0)
MCV: 88.7 fL (ref 78.0–100.0)
Platelets: 202 10*3/uL (ref 150–400)
RBC: 3.28 MIL/uL — AB (ref 3.87–5.11)
RDW: 13.3 % (ref 11.5–15.5)
WBC: 6.2 10*3/uL (ref 4.0–10.5)

## 2016-03-23 LAB — HEPARIN LEVEL (UNFRACTIONATED): HEPARIN UNFRACTIONATED: 0.45 [IU]/mL (ref 0.30–0.70)

## 2016-03-23 MED ORDER — WARFARIN SODIUM 5 MG PO TABS
5.0000 mg | ORAL_TABLET | Freq: Once | ORAL | Status: AC
  Administered 2016-03-23: 5 mg via ORAL
  Filled 2016-03-23: qty 1

## 2016-03-23 NOTE — Care Management Note (Signed)
Case Management Note  Patient Details  Name: [name] MRN: [id number] Date of Birth: February 15, 1925  Subjective/Objective:   Patient adm from home with DVT. PT has rec HHPT.  Daughter at bedside and states she would like to use [facility] for services.                Action/Plan: Patient will have home health PT, RN and CSW arranged with [facility]. Will notify [name] of [facility], who will obtain information from chart. Patient and family aware [facility] will have 48 hours to make first visit after discharge.    Expected Discharge Date:  03/24/16               Expected Discharge Plan:  Home w Home Health Services  In-House Referral:     Discharge planning Services  CM Consult  Post Acute Care Choice:  Home Health Choice offered to:  Patient, Adult Children  DME Arranged:    DME Agency:  [facility]  HH Arranged:  RN, PT, Social Work [employer] Agency:  [facility]  Status of Service:  Completed, signed off  If discussed at [employer], dates discussed:    Additional Comments:  Carmella Kees, Chrystine Oiler, RN 03/23/2016, 1:23 PM

## 2016-03-23 NOTE — Progress Notes (Signed)
ANTICOAGULATION CONSULT NOTE - follow up  Pharmacy Consult for heparin and coumadin Indication: DVT  Allergies  Allergen Reactions  . Aspirin Other (See Comments)    unknown   Patient Measurements: Height: 5' (152.4 cm) Weight: 142 lb (64.411 kg) IBW/kg (Calculated) : 45.5 HEPARIN DW (KG): 59.1  Vital Signs: Temp: 98.6 F (37 C) (07/19 2215) Temp Source: Oral (07/19 2215) BP: 143/128 mmHg (07/19 2215) Pulse Rate: 74 (07/19 2215)  Labs:  Recent Labs  03/20/16 1252 03/20/16 2115 03/21/16 0643 03/21/16 0651 03/21/16 1036 03/22/16 0420 03/23/16 0406  HGB 9.4*  9.2*  --   --  10.0*  --  10.2* 9.4*  HCT 29.4*  27.0*  --   --  30.4*  --  31.3* 29.1*  PLT 180  --   --  187  --  206 202  APTT  --  199*  --   --  90*  --   --   LABPROT 14.6  --   --  15.8*  --  15.8* 17.4*  INR 1.12  --   --  1.24  --  1.24 1.42  HEPARINUNFRC  --   --   --  0.40  --  0.35 0.45  CREATININE 1.30*  --  1.06*  --   --  1.23*  --    Estimated Creatinine Clearance: 26 mL/min (by C-G formula based on Cr of 1.23).  Medical History: Past Medical History  Diagnosis Date  . Cardiomyopathy     Nonischemic, EF 25-30% 2007 / EF improved 50% 2008, ( catheterization not done) nuclear 2007, fixed anteroapical defect, no ischemia  . Ejection fraction     EF 50%, echo, 2008  . Hypertension   . Pulmonary hypertension (HCC)   . ASD (atrial septal defect)     Small  . Edema     Chronic lower extremity edema... venous insufficiency  . Hypothyroidism   . Stroke (HCC)   . Diverticulitis   . Gout   . CKD (chronic kidney disease)   . Chronic back pain greater than 3 months duration   . Anemia   . Multilevel degenerative disc disease    Medications:  Prescriptions prior to admission  Medication Sig Dispense Refill Last Dose  . ALPRAZolam (XANAX) 0.5 MG tablet Take 0.5 mg by mouth 2 (two) times daily.   03/20/2016 at Unknown time  . carvedilol (COREG) 25 MG tablet Take 25 mg by mouth 2 (two) times  daily with a meal.    03/20/2016 at 0900  . furosemide (LASIX) 20 MG tablet Take 1 tablet (20 mg total) by mouth daily. (Patient taking differently: Take 20 mg by mouth 2 (two) times daily. ) 30 tablet 3 03/19/2016 at Unknown time  . gabapentin (NEURONTIN) 100 MG capsule Take 100 mg by mouth 2 (two) times daily.    03/20/2016 at Unknown time  . levothyroxine (SYNTHROID, LEVOTHROID) 75 MCG tablet Take 75 mcg by mouth daily.     03/20/2016 at Unknown time  . oxybutynin (DITROPAN) 5 MG tablet Take 5 mg by mouth daily.   03/20/2016 at Unknown time  . pantoprazole (PROTONIX) 40 MG tablet Take 1 tablet (40 mg total) by mouth 2 (two) times daily. (Patient taking differently: Take 40 mg by mouth daily. ) 30 tablet 3 03/20/2016 at Unknown time  . potassium chloride SA (K-DUR,KLOR-CON) 20 MEQ tablet Take 20 mEq by mouth daily.    03/20/2016 at Unknown time  . cephALEXin (KEFLEX) 500 MG capsule Take  1 capsule (500 mg total) by mouth 3 (three) times daily. (Patient not taking: Reported on 03/20/2016) 30 capsule 0 Not Taking at Unknown time   Assessment: 82 y.o. female, w Hypertension, CKD stage 3, Hypothyroidism, Cardiomyopathy, Pulmonary htn, apparently presents with c/o lower ext edema worse for the past 2 -3 days. Pt has pain in the feet due to swelling. Pt unable to walk due to leg swelling. Ultrasound shows DVT. No hx of DVT.  Pt will be admitted for DVT and anticoagulation with Heparin and Coumadin. Heparin level is at goal today 0.45. INR 1.42  Goal of Therapy:  Heparin level 0.3-0.7  INR 2-3 Monitor platelets by anticoagulation protocol: Yes   Plan:  Coumadin  po x 1 today Continue Heparin infusion at 950 units/hr Monitor INR, CBC, Heparin level  Elder Cyphers, BS Loura Back, [place] Clinical Pharmacist Pager [phone] 03/23/2016 7:39 AM

## 2016-03-23 NOTE — Progress Notes (Signed)
Subjective: Patient remained confused and disoriented. She is requiring sitter to prevent from fall. She is on anticoagulation. Objective  Temp:  [98.6 F (37 C)] 98.6 F (37 C) (07/19 2215) Pulse Rate:  [74] 74 (07/19 2215) Resp:  [18] 18 (07/19 1335) BP: (138-143)/(68-128) 143/128 mmHg (07/19 2215) SpO2:  [96 %-97 %] 96 % (07/19 2215) Weight change:  Last BM Date: 03/21/16  Intake/Output from previous day: 07/19 0701 - 07/20 0700 In: 360 [P.O.:360] Out: -   PHYSICAL EXAM General appearance: alert and no distress Resp: clear to auscultation bilaterally Cardio: S1, S2 normal GI: soft, non-tender; bowel sounds normal; no masses,  no organomegaly Extremities: 2++ edema  Lab Results:  Results for orders placed or performed during the hospital encounter of 03/20/16 (from the past 48 hour(s))  APTT     Status: Abnormal   Collection Time: 03/21/16 10:36 AM  Result Value Ref Range   aPTT 90 (H) 24 - 37 seconds    Comment:        IF BASELINE aPTT IS ELEVATED, SUGGEST PATIENT RISK ASSESSMENT BE USED TO DETERMINE APPROPRIATE ANTICOAGULANT THERAPY.   CBC     Status: Abnormal   Collection Time: 03/22/16  4:20 AM  Result Value Ref Range   WBC 6.4 4.0 - 10.5 K/uL   RBC 3.50 (L) 3.87 - 5.11 MIL/uL   Hemoglobin 10.2 (L) 12.0 - 15.0 g/dL   HCT 31.3 (L) 36.0 - 46.0 %   MCV 89.4 78.0 - 100.0 fL   MCH 29.1 26.0 - 34.0 pg   MCHC 32.6 30.0 - 36.0 g/dL   RDW 13.4 11.5 - 15.5 %   Platelets 206 150 - 400 K/uL  Protime-INR     Status: Abnormal   Collection Time: 03/22/16  4:20 AM  Result Value Ref Range   Prothrombin Time 15.8 (H) 11.6 - 15.2 seconds   INR 1.24 0.00 - 1.49  Heparin level (unfractionated)     Status: None   Collection Time: 03/22/16  4:20 AM  Result Value Ref Range   Heparin Unfractionated 0.35 0.30 - 0.70 IU/mL    Comment:        IF HEPARIN RESULTS ARE BELOW EXPECTED VALUES, AND PATIENT DOSAGE HAS BEEN CONFIRMED, SUGGEST FOLLOW UP TESTING OF ANTITHROMBIN III  LEVELS.   Basic metabolic panel     Status: Abnormal   Collection Time: 03/22/16  4:20 AM  Result Value Ref Range   Sodium 142 135 - 145 mmol/L   Potassium 3.4 (L) 3.5 - 5.1 mmol/L   Chloride 106 101 - 111 mmol/L   CO2 27 22 - 32 mmol/L   Glucose, Bld 114 (H) 65 - 99 mg/dL   BUN 20 6 - 20 mg/dL   Creatinine, Ser 1.23 (H) 0.44 - 1.00 mg/dL   Calcium 8.7 (L) 8.9 - 10.3 mg/dL   GFR calc non Af Amer 38 (L) >60 mL/min   GFR calc Af Amer 44 (L) >60 mL/min    Comment: (NOTE) The eGFR has been calculated using the CKD EPI equation. This calculation has not been validated in all clinical situations. eGFR's persistently <60 mL/min signify possible Chronic Kidney Disease.    Anion gap 9 5 - 15  CBC     Status: Abnormal   Collection Time: 03/23/16  4:06 AM  Result Value Ref Range   WBC 6.2 4.0 - 10.5 K/uL   RBC 3.28 (L) 3.87 - 5.11 MIL/uL   Hemoglobin 9.4 (L) 12.0 - 15.0 g/dL   HCT  29.1 (L) 36.0 - 46.0 %   MCV 88.7 78.0 - 100.0 fL   MCH 28.7 26.0 - 34.0 pg   MCHC 32.3 30.0 - 36.0 g/dL   RDW 13.3 11.5 - 15.5 %   Platelets 202 150 - 400 K/uL  Protime-INR     Status: Abnormal   Collection Time: 03/23/16  4:06 AM  Result Value Ref Range   Prothrombin Time 17.4 (H) 11.6 - 15.2 seconds   INR 1.42 0.00 - 1.49  Heparin level (unfractionated)     Status: None   Collection Time: 03/23/16  4:06 AM  Result Value Ref Range   Heparin Unfractionated 0.45 0.30 - 0.70 IU/mL    Comment:        IF HEPARIN RESULTS ARE BELOW EXPECTED VALUES, AND PATIENT DOSAGE HAS BEEN CONFIRMED, SUGGEST FOLLOW UP TESTING OF ANTITHROMBIN III LEVELS.     ABGS  Recent Labs  03/20/16 1252  TCO2 25   CULTURES No results found for this or any previous visit (from the past 240 hour(s)). Studies/Results: No results found.  Medications: I have reviewed the patient's current medications.  Assesment:   Active Problems:   Hypothyroidism   Anemia   DVT (deep venous thrombosis) (HCC)   CKD (chronic kidney  disease) stage 3, GFR 30-59 ml/min   Hyperglycemia    Plan:  Medications reviewed Continue heparin Continue coumadin  Supportive care    LOS: 3 days   Haasini Patnaude 03/23/2016, 8:15 AM

## 2016-03-24 LAB — CBC
HCT: 30 % — ABNORMAL LOW (ref 36.0–46.0)
Hemoglobin: 10.5 g/dL — ABNORMAL LOW (ref 12.0–15.0)
MCH: 31.1 pg (ref 26.0–34.0)
MCHC: 35 g/dL (ref 30.0–36.0)
MCV: 88.8 fL (ref 78.0–100.0)
PLATELETS: 200 10*3/uL (ref 150–400)
RBC: 3.38 MIL/uL — ABNORMAL LOW (ref 3.87–5.11)
RDW: 13.4 % (ref 11.5–15.5)
WBC: 6.4 10*3/uL (ref 4.0–10.5)

## 2016-03-24 LAB — HEPARIN LEVEL (UNFRACTIONATED)

## 2016-03-24 LAB — PROTIME-INR
INR: 1.92 — ABNORMAL HIGH (ref 0.00–1.49)
Prothrombin Time: 21.8 seconds — ABNORMAL HIGH (ref 11.6–15.2)

## 2016-03-24 MED ORDER — WARFARIN SODIUM 5 MG PO TABS: 5.0000 mg | ORAL_TABLET | Freq: Every day | ORAL | Status: DC

## 2016-03-24 NOTE — Discharge Summary (Signed)
Physician Discharge Summary  Patient ID: [name] MRN: [id number] DOB/AGE: 1925/01/23 82 y.o. Primary Care Physician:Phynix Horton, MD Admit date: 2016-04-08 Discharge date: 03/24/2016    Discharge Diagnoses:   Active Problems:   Hypothyroidism   Anemia   DVT (deep venous thrombosis) (HCC)   CKD (chronic kidney disease) stage 3, GFR 30-59 ml/min   Hyperglycemia     Medication List    STOP taking these medications        cephALEXin 500 MG capsule  Commonly known as:  KEFLEX      TAKE these medications        ALPRAZolam 0.5 MG tablet  Commonly known as:  XANAX  Take 0.5 mg by mouth 2 (two) times daily.     carvedilol 25 MG tablet  Commonly known as:  COREG  Take 25 mg by mouth 2 (two) times daily with a meal.     furosemide 20 MG tablet  Commonly known as:  LASIX  Take 1 tablet (20 mg total) by mouth daily.     gabapentin 100 MG capsule  Commonly known as:  NEURONTIN  Take 100 mg by mouth 2 (two) times daily.     levothyroxine 75 MCG tablet  Commonly known as:  SYNTHROID, LEVOTHROID  Take 75 mcg by mouth daily.     oxybutynin 5 MG tablet  Commonly known as:  DITROPAN  Take 5 mg by mouth daily.     pantoprazole 40 MG tablet  Commonly known as:  PROTONIX  Take 1 tablet (40 mg total) by mouth 2 (two) times daily.     potassium chloride SA 20 MEQ tablet  Commonly known as:  K-DUR,KLOR-CON  Take 20 mEq by mouth daily.     warfarin 5 MG tablet  Commonly known as:  COUMADIN  Take 1 tablet (5 mg total) by mouth daily.        Discharged Condition: improved    Consults: pharmacy for coumain dosing  Significant Diagnostic Studies: Dg Ankle Complete Left  Apr 08, 2016  CLINICAL DATA:  Pain and swelling EXAM: LEFT ANKLE COMPLETE - 3+ VIEW COMPARISON:  None. FINDINGS: Frontal, oblique, and lateral views were obtained. There is generalized soft tissue swelling. There is a questionable old avulsion along the medial malleolus. No acute fracture is  evident. No joint effusion. The ankle mortise appears intact. There are spurs arising from the posterior inferior calcaneus. Joint spaces appear normal. There are scattered foci of arterial vascular calcification in the anterior tibial artery. IMPRESSION: Generalized soft tissue swelling. Question old injury medial malleolus. No acute fracture. Ankle mortise appears intact. There are calcaneal spurs. There is atherosclerotic calcification in the anterior tibial artery. Electronically Signed   By: Bretta Bang III M.D.   On: 04/08/16 15:56   Dg Ankle Complete Right  04/08/2016  CLINICAL DATA:  Bilateral ankle pain and swelling, no known injury EXAM: RIGHT ANKLE - COMPLETE 3+ VIEW COMPARISON:  None. FINDINGS: Three views of the right ankle submitted no acute fracture or subluxation. There is diffuse soft tissue swelling around the ankle. Mild spurring of distal tibia medial malleolus. Ankle mortise is preserved. Plantar spurring and posterior spurring of calcaneus. IMPRESSION: No acute fracture or subluxation. Diffuse soft tissue swelling around the ankle. Plantar and posterior spurring of calcaneus Electronically Signed   By: Natasha Mead M.D.   On: April 08, 2016 15:51   US Venous Img Lower Bilateral  2016-04-08  CLINICAL DATA:  82 year old female with chronic bilateral lower extremity swelling recently worse on the  left. EXAM: BILATERAL LOWER EXTREMITY VENOUS DOPPLER ULTRASOUND TECHNIQUE: Gray-scale sonography with graded compression, as well as color Doppler and duplex ultrasound were performed to evaluate the lower extremity deep venous systems from the level of the common femoral vein and including the common femoral, femoral, profunda femoral, popliteal and calf veins including the posterior tibial, peroneal and gastrocnemius veins when visible. The superficial great saphenous vein was also interrogated. Spectral Doppler was utilized to evaluate flow at rest and with distal augmentation maneuvers in the  common femoral, femoral and popliteal veins. COMPARISON:  None. FINDINGS: RIGHT LOWER EXTREMITY Common Femoral Vein: No evidence of thrombus. Normal compressibility, respiratory phasicity and response to augmentation. Saphenofemoral Junction: No evidence of thrombus. Normal compressibility and flow on color Doppler imaging. Profunda Femoral Vein: No evidence of thrombus. Normal compressibility and flow on color Doppler imaging. Femoral Vein: No evidence of thrombus. Normal compressibility, respiratory phasicity and response to augmentation. Popliteal Vein: Partial compressibility of the popliteal vein secondary to eccentric echogenic wall thickening. Color flow is present within the vessel lumen. Calf Veins: No evidence of thrombus. Normal compressibility and flow on color Doppler imaging. Limited visualization of the peroneal vein. Superficial Great Saphenous Vein: No evidence of thrombus. Normal compressibility and flow on color Doppler imaging. Venous Reflux:  None. Other Findings:  Superficial subcutaneous edema at the ankle. LEFT LOWER EXTREMITY Common Femoral Vein: No evidence of thrombus. Normal compressibility, respiratory phasicity and response to augmentation. Saphenofemoral Junction: No evidence of thrombus. Normal compressibility and flow on color Doppler imaging. Profunda Femoral Vein: No evidence of thrombus. Normal compressibility and flow on color Doppler imaging. Femoral Vein: No evidence of thrombus. Normal compressibility, respiratory phasicity and response to augmentation. Popliteal Vein: No evidence of thrombus. Normal compressibility, respiratory phasicity and response to augmentation. Calf Veins: No evidence of thrombus. Normal compressibility and flow on color Doppler imaging. Limited visualization of the peroneal vein. Superficial Great Saphenous Vein: No evidence of thrombus. Normal compressibility and flow on color Doppler imaging. Venous Reflux:  None. Other Findings: Superficial  subcutaneous edema in the calf and ankle. IMPRESSION: 1. Positive for nonocclusive DVT in the right popliteal vein in the form of eccentric wall thickening. Findings are most suggestive of partially recanalized chronic DVT. 2. No evidence of acute or chronic DVT on the left. 3. Superficial subcutaneous edema worse on the left than the right. 4. Bilateral superficial inguinal adenopathy is presumably reactive. Electronically Signed   By: Malachy Moan M.D.   On: 03/20/2016 14:33    Lab Results: Basic Metabolic Panel:  Recent Labs  16/10/96 0420  NA 142  K 3.4*  CL 106  CO2 27  GLUCOSE 114*  BUN 20  CREATININE 1.23*  CALCIUM 8.7*   Liver Function Tests: No results for input(s): AST, ALT, ALKPHOS, BILITOT, PROT, ALBUMIN in the last 72 hours.   CBC:  Recent Labs  03/23/16 0406 03/24/16 0407  WBC 6.2 6.4  HGB 9.4* 10.5*  HCT 29.1* 30.0*  MCV 88.7 88.8  PLT 202 200    No results found for this or any previous visit (from the past 240 hour(s)).   Hospital Course:   This is an 82 years old female with history of multiple medical illnesses who was admitted due to right lower extremity DVT. Due to her renal insufficiency patient was started on IV heparin and coumadin. The medications were monitored by pharmacy and her coumadin was adjusted to achieve therapeutic INR. On discharge her INR was 1.9. Patient was very confused and disoriented  due to her baseline dementia. I have talked to her daughter her anticoagulation which will continued for 3 months. Arrangement will be made with coumadin clinic  To  Monitor her INR>  Discharge Exam: Blood pressure 169/74, pulse 68, temperature 98.4 F (36.9 C), temperature source Oral, resp. rate 17, height 5' (1.524 m), weight 64.411 kg (142 lb), SpO2 100 %.   Disposition:  home      Discharge Instructions    Face-to-face encounter (required for Medicare/Medicaid patients)    Complete by:  As directed   I Jamelle Haring certify  that this patient is under my care and that I, or a nurse practitioner or physician's assistant working with me, had a face-to-face encounter that meets the physician face-to-face encounter requirements with this patient on 03/20/2016. The encounter with the patient was in whole, or in part for the following medical condition(s) which is the primary reason for home health care (List medical condition): difficulty walking and bearing weight  The encounter with the patient was in whole, or in part, for the following medical condition, which is the primary reason for home health care:  inabilty to bear weight  I certify that, based on my findings, the following services are medically necessary home health services:   Nursing Physical therapy    Reason for Medically Necessary Home Health Services:   Skilled Nursing- Skilled Assessment/Observation Therapy- Therapeutic Exercises to Increase Strength and Endurance    My clinical findings support the need for the above services:  Unable to leave home safely without assistance and/or assistive device  Further, I certify that my clinical findings support that this patient is homebound due to:  Unable to leave home safely without assistance     Home Health    Complete by:  As directed   To provide the following care/treatments:   PT Home Health Aide             Follow-up Information    Follow up with [facility], [place] In 1 week.   Specialty:  Internal Medicine   Contact information:   [address] [place] [address] [phone]       Signed: Avon Gully   03/24/2016, 8:38 AM

## 2016-03-24 NOTE — Progress Notes (Signed)
Pt discharged home with daughter. Discharge instructions given to daughter and all questions answered.

## 2016-03-24 NOTE — Progress Notes (Signed)
Left Advance Directives information with patient along with contact information. Patient was unable to discuss at this time.

## 2016-03-27 ENCOUNTER — Ambulatory Visit (INDEPENDENT_AMBULATORY_CARE_PROVIDER_SITE_OTHER): Payer: Medicare HMO | Admitting: *Deleted

## 2016-03-27 DIAGNOSIS — I129 Hypertensive chronic kidney disease with stage 1 through stage 4 chronic kidney disease, or unspecified chronic kidney disease: Secondary | ICD-10-CM | POA: Diagnosis not present

## 2016-03-27 DIAGNOSIS — I82401 Acute embolism and thrombosis of unspecified deep veins of right lower extremity: Secondary | ICD-10-CM | POA: Diagnosis not present

## 2016-03-27 DIAGNOSIS — E039 Hypothyroidism, unspecified: Secondary | ICD-10-CM | POA: Diagnosis not present

## 2016-03-27 DIAGNOSIS — Z8673 Personal history of transient ischemic attack (TIA), and cerebral infarction without residual deficits: Secondary | ICD-10-CM | POA: Diagnosis not present

## 2016-03-27 DIAGNOSIS — N183 Chronic kidney disease, stage 3 (moderate): Secondary | ICD-10-CM | POA: Diagnosis not present

## 2016-03-27 DIAGNOSIS — Z7901 Long term (current) use of anticoagulants: Secondary | ICD-10-CM | POA: Insufficient documentation

## 2016-03-27 DIAGNOSIS — D649 Anemia, unspecified: Secondary | ICD-10-CM | POA: Diagnosis not present

## 2016-03-27 DIAGNOSIS — I272 Other secondary pulmonary hypertension: Secondary | ICD-10-CM | POA: Diagnosis not present

## 2016-03-27 DIAGNOSIS — M549 Dorsalgia, unspecified: Secondary | ICD-10-CM | POA: Diagnosis not present

## 2016-03-27 DIAGNOSIS — R739 Hyperglycemia, unspecified: Secondary | ICD-10-CM | POA: Diagnosis not present

## 2016-03-27 DIAGNOSIS — Q211 Atrial septal defect: Secondary | ICD-10-CM | POA: Diagnosis not present

## 2016-03-27 LAB — POCT INR: INR: 4.2

## 2016-03-29 DIAGNOSIS — Q211 Atrial septal defect: Secondary | ICD-10-CM | POA: Diagnosis not present

## 2016-03-29 DIAGNOSIS — E039 Hypothyroidism, unspecified: Secondary | ICD-10-CM | POA: Diagnosis not present

## 2016-03-29 DIAGNOSIS — I129 Hypertensive chronic kidney disease with stage 1 through stage 4 chronic kidney disease, or unspecified chronic kidney disease: Secondary | ICD-10-CM | POA: Diagnosis not present

## 2016-03-29 DIAGNOSIS — Z8673 Personal history of transient ischemic attack (TIA), and cerebral infarction without residual deficits: Secondary | ICD-10-CM | POA: Diagnosis not present

## 2016-03-29 DIAGNOSIS — I82401 Acute embolism and thrombosis of unspecified deep veins of right lower extremity: Secondary | ICD-10-CM | POA: Diagnosis not present

## 2016-03-29 DIAGNOSIS — D649 Anemia, unspecified: Secondary | ICD-10-CM | POA: Diagnosis not present

## 2016-03-29 DIAGNOSIS — R739 Hyperglycemia, unspecified: Secondary | ICD-10-CM | POA: Diagnosis not present

## 2016-03-29 DIAGNOSIS — I272 Other secondary pulmonary hypertension: Secondary | ICD-10-CM | POA: Diagnosis not present

## 2016-03-29 DIAGNOSIS — N183 Chronic kidney disease, stage 3 (moderate): Secondary | ICD-10-CM | POA: Diagnosis not present

## 2016-03-29 DIAGNOSIS — M549 Dorsalgia, unspecified: Secondary | ICD-10-CM | POA: Diagnosis not present

## 2016-03-30 ENCOUNTER — Telehealth: Payer: Self-pay | Admitting: *Deleted

## 2016-03-30 ENCOUNTER — Ambulatory Visit (INDEPENDENT_AMBULATORY_CARE_PROVIDER_SITE_OTHER): Payer: Medicare HMO | Admitting: *Deleted

## 2016-03-30 DIAGNOSIS — M1990 Unspecified osteoarthritis, unspecified site: Secondary | ICD-10-CM | POA: Diagnosis not present

## 2016-03-30 DIAGNOSIS — E039 Hypothyroidism, unspecified: Secondary | ICD-10-CM | POA: Diagnosis not present

## 2016-03-30 DIAGNOSIS — I272 Other secondary pulmonary hypertension: Secondary | ICD-10-CM | POA: Diagnosis not present

## 2016-03-30 DIAGNOSIS — M549 Dorsalgia, unspecified: Secondary | ICD-10-CM | POA: Diagnosis not present

## 2016-03-30 DIAGNOSIS — I82401 Acute embolism and thrombosis of unspecified deep veins of right lower extremity: Secondary | ICD-10-CM | POA: Diagnosis not present

## 2016-03-30 DIAGNOSIS — D649 Anemia, unspecified: Secondary | ICD-10-CM | POA: Diagnosis not present

## 2016-03-30 DIAGNOSIS — R739 Hyperglycemia, unspecified: Secondary | ICD-10-CM | POA: Diagnosis not present

## 2016-03-30 DIAGNOSIS — I10 Essential (primary) hypertension: Secondary | ICD-10-CM | POA: Diagnosis not present

## 2016-03-30 DIAGNOSIS — N183 Chronic kidney disease, stage 3 (moderate): Secondary | ICD-10-CM | POA: Diagnosis not present

## 2016-03-30 DIAGNOSIS — Z8673 Personal history of transient ischemic attack (TIA), and cerebral infarction without residual deficits: Secondary | ICD-10-CM | POA: Diagnosis not present

## 2016-03-30 DIAGNOSIS — Q211 Atrial septal defect: Secondary | ICD-10-CM | POA: Diagnosis not present

## 2016-03-30 DIAGNOSIS — Z7901 Long term (current) use of anticoagulants: Secondary | ICD-10-CM

## 2016-03-30 DIAGNOSIS — I129 Hypertensive chronic kidney disease with stage 1 through stage 4 chronic kidney disease, or unspecified chronic kidney disease: Secondary | ICD-10-CM | POA: Diagnosis not present

## 2016-03-30 LAB — POCT INR: INR: 3.8

## 2016-03-30 NOTE — Telephone Encounter (Signed)
Done.  See coumadin note. 

## 2016-03-30 NOTE — Telephone Encounter (Signed)
Particia Jasper called in reference to INR on patient [name].  INR 3.8    Please call [phone]

## 2016-04-04 ENCOUNTER — Ambulatory Visit (INDEPENDENT_AMBULATORY_CARE_PROVIDER_SITE_OTHER): Payer: Medicare HMO | Admitting: *Deleted

## 2016-04-04 ENCOUNTER — Telehealth: Payer: Self-pay | Admitting: *Deleted

## 2016-04-04 DIAGNOSIS — N183 Chronic kidney disease, stage 3 (moderate): Secondary | ICD-10-CM | POA: Diagnosis not present

## 2016-04-04 DIAGNOSIS — E039 Hypothyroidism, unspecified: Secondary | ICD-10-CM | POA: Diagnosis not present

## 2016-04-04 DIAGNOSIS — I129 Hypertensive chronic kidney disease with stage 1 through stage 4 chronic kidney disease, or unspecified chronic kidney disease: Secondary | ICD-10-CM | POA: Diagnosis not present

## 2016-04-04 DIAGNOSIS — M549 Dorsalgia, unspecified: Secondary | ICD-10-CM | POA: Diagnosis not present

## 2016-04-04 DIAGNOSIS — Z7901 Long term (current) use of anticoagulants: Secondary | ICD-10-CM

## 2016-04-04 DIAGNOSIS — R739 Hyperglycemia, unspecified: Secondary | ICD-10-CM | POA: Diagnosis not present

## 2016-04-04 DIAGNOSIS — Q211 Atrial septal defect: Secondary | ICD-10-CM | POA: Diagnosis not present

## 2016-04-04 DIAGNOSIS — I272 Other secondary pulmonary hypertension: Secondary | ICD-10-CM | POA: Diagnosis not present

## 2016-04-04 DIAGNOSIS — I82401 Acute embolism and thrombosis of unspecified deep veins of right lower extremity: Secondary | ICD-10-CM | POA: Diagnosis not present

## 2016-04-04 DIAGNOSIS — D649 Anemia, unspecified: Secondary | ICD-10-CM | POA: Diagnosis not present

## 2016-04-04 DIAGNOSIS — Z8673 Personal history of transient ischemic attack (TIA), and cerebral infarction without residual deficits: Secondary | ICD-10-CM | POA: Diagnosis not present

## 2016-04-04 LAB — POCT INR: INR: 2.1

## 2016-04-04 NOTE — Telephone Encounter (Signed)
INR 2.1

## 2016-04-04 NOTE — Telephone Encounter (Signed)
Done. Order given to Angie.  See coumadin note.

## 2016-04-06 DIAGNOSIS — Z8673 Personal history of transient ischemic attack (TIA), and cerebral infarction without residual deficits: Secondary | ICD-10-CM | POA: Diagnosis not present

## 2016-04-06 DIAGNOSIS — I129 Hypertensive chronic kidney disease with stage 1 through stage 4 chronic kidney disease, or unspecified chronic kidney disease: Secondary | ICD-10-CM | POA: Diagnosis not present

## 2016-04-06 DIAGNOSIS — E039 Hypothyroidism, unspecified: Secondary | ICD-10-CM | POA: Diagnosis not present

## 2016-04-06 DIAGNOSIS — N183 Chronic kidney disease, stage 3 (moderate): Secondary | ICD-10-CM | POA: Diagnosis not present

## 2016-04-06 DIAGNOSIS — Q211 Atrial septal defect: Secondary | ICD-10-CM | POA: Diagnosis not present

## 2016-04-06 DIAGNOSIS — R739 Hyperglycemia, unspecified: Secondary | ICD-10-CM | POA: Diagnosis not present

## 2016-04-06 DIAGNOSIS — I82401 Acute embolism and thrombosis of unspecified deep veins of right lower extremity: Secondary | ICD-10-CM | POA: Diagnosis not present

## 2016-04-06 DIAGNOSIS — D649 Anemia, unspecified: Secondary | ICD-10-CM | POA: Diagnosis not present

## 2016-04-06 DIAGNOSIS — I272 Other secondary pulmonary hypertension: Secondary | ICD-10-CM | POA: Diagnosis not present

## 2016-04-06 DIAGNOSIS — M549 Dorsalgia, unspecified: Secondary | ICD-10-CM | POA: Diagnosis not present

## 2016-04-10 ENCOUNTER — Ambulatory Visit (INDEPENDENT_AMBULATORY_CARE_PROVIDER_SITE_OTHER): Payer: Medicare HMO | Admitting: Cardiovascular Disease

## 2016-04-10 ENCOUNTER — Encounter: Payer: Self-pay | Admitting: Cardiovascular Disease

## 2016-04-10 VITALS — BP 106/65 | HR 60 | Ht 65.0 in | Wt 143.0 lb

## 2016-04-10 DIAGNOSIS — Z136 Encounter for screening for cardiovascular disorders: Secondary | ICD-10-CM

## 2016-04-10 DIAGNOSIS — R600 Localized edema: Secondary | ICD-10-CM

## 2016-04-10 DIAGNOSIS — I429 Cardiomyopathy, unspecified: Secondary | ICD-10-CM | POA: Diagnosis not present

## 2016-04-10 DIAGNOSIS — I82401 Acute embolism and thrombosis of unspecified deep veins of right lower extremity: Secondary | ICD-10-CM

## 2016-04-10 NOTE — Patient Instructions (Signed)
Medication Instructions:  Continue all current medications.  Labwork: None.  Testing/Procedures:  Your physician has requested that you have an echocardiogram. Echocardiography is a painless test that uses sound waves to create images of your heart. It provides your doctor with information about the size and shape of your heart and how well your heart's chambers and valves are working. This procedure takes approximately one hour. There are no restrictions for this procedure.  Office will contact with results via phone or letter.    Follow-Up: 3 months   Any Other Special Instructions Will Be Listed Below (If Applicable).  If you need a refill on your cardiac medications before your next appointment, please call your pharmacy.

## 2016-04-10 NOTE — Progress Notes (Signed)
CARDIOLOGY CONSULT NOTE  Patient ID: [name] MRN: [id number] DOB/AGE: Jan 16, 1925 82 y.o.  Admit date: (Not on file) Primary Physician: Avon Gully, MD Referring Physician:   Reason for Consultation: DVT  HPI: The patient is an 82 year old woman who was diagnosed with nonocclusive DVT in the right popliteal vein suggestive of partially recanalized chronic DVT on 03/20/16. She is on warfarin.  Additional past medical history includes hypertension, chronic kidney disease, hypothyroidism, and pulmonary hypertension. She also reportedly has a history of a nonischemic cardiomyopathy with EF 25-30% 2007 which improved to 50% in 2008.  ECG performed in the office today which I personally reviewed demonstrates normal sinus rhythm with no ischemic ST segment or T-wave abnormalities, nor any arrhythmias.  She has had bilateral lower extremity edema for the past year. She denies chest pain and shortness of breath, no PND. She does not like wearing compression stockings.  She has dementia.  She is here with her daughter.    Allergies  Allergen Reactions  . Aspirin Other (See Comments)    unknown    Current Outpatient Prescriptions  Medication Sig Dispense Refill  . ALPRAZolam (XANAX) 0.5 MG tablet Take 0.5 mg by mouth 2 (two) times daily.    . carvedilol (COREG) 25 MG tablet Take 25 mg by mouth 2 (two) times daily with a meal.     . furosemide (LASIX) 20 MG tablet Take 1 tablet (20 mg total) by mouth daily. (Patient taking differently: Take 20 mg by mouth 2 (two) times daily. ) 30 tablet 3  . gabapentin (NEURONTIN) 100 MG capsule Take 100 mg by mouth 2 (two) times daily.     Marland Kitchen levothyroxine (SYNTHROID, LEVOTHROID) 75 MCG tablet Take 75 mcg by mouth daily.      Marland Kitchen oxybutynin (DITROPAN) 5 MG tablet Take 5 mg by mouth daily.    . pantoprazole (PROTONIX) 40 MG tablet Take 1 tablet (40 mg total) by mouth 2 (two) times daily. (Patient taking differently: Take 40 mg by mouth  daily. ) 30 tablet 3  . potassium chloride SA (K-DUR,KLOR-CON) 20 MEQ tablet Take 20 mEq by mouth daily.     . vitamin E 400 UNIT capsule Take 400 Units by mouth daily.    Marland Kitchen warfarin (COUMADIN) 5 MG tablet Take 1 tablet (5 mg total) by mouth daily. 30 tablet 0   No current facility-administered medications for this visit.     Past Medical History:  Diagnosis Date  . Anemia   . ASD (atrial septal defect)    Small  . Cardiomyopathy    Nonischemic, EF 25-30% 2007 / EF improved 50% 2008, ( catheterization not done) nuclear 2007, fixed anteroapical defect, no ischemia  . Chronic back pain greater than 3 months duration   . CKD (chronic kidney disease)   . Diverticulitis   . Edema    Chronic lower extremity edema... venous insufficiency  . Ejection fraction    EF 50%, echo, 2008  . Gout   . Hypertension   . Hypothyroidism   . Multilevel degenerative disc disease   . Pulmonary hypertension (HCC)   . Stroke [facility])     Past Surgical History:  Procedure Laterality Date  . ABDOMINAL HYSTERECTOMY    . BREAST SURGERY    . CATARACT EXTRACTION      Social History   Social History  . Marital status: Widowed    Spouse name: N/A  . Number of children: N/A  . Years of education: N/A  Occupational History  . RETIRED Retired   Social History Main Topics  . Smoking status: Never Smoker  . Smokeless tobacco: Never Used  . Alcohol use No  . Drug use: No  . Sexual activity: Not on file   Other Topics Concern  . Not on file   Social History Narrative  . No narrative on file     No family history of premature CAD in 1st degree relatives.  Prior to Admission medications   Medication Sig Start Date End Date Taking? Authorizing Provider  ALPRAZolam Prudy Feeler) 0.5 MG tablet Take 0.5 mg by mouth 2 (two) times daily.    Historical Provider, MD  carvedilol (COREG) 25 MG tablet Take 25 mg by mouth 2 (two) times daily with a meal.     Historical Provider, MD  furosemide (LASIX) 20 MG  tablet Take 1 tablet (20 mg total) by mouth daily. Patient taking differently: Take 20 mg by mouth 2 (two) times daily.  02/12/15   Avon Gully, MD  gabapentin (NEURONTIN) 100 MG capsule Take 100 mg by mouth 2 (two) times daily.  12/09/14   Historical Provider, MD  levothyroxine (SYNTHROID, LEVOTHROID) 75 MCG tablet Take 75 mcg by mouth daily.      Historical Provider, MD  oxybutynin (DITROPAN) 5 MG tablet Take 5 mg by mouth daily.    Historical Provider, MD  pantoprazole (PROTONIX) 40 MG tablet Take 1 tablet (40 mg total) by mouth 2 (two) times daily. Patient taking differently: Take 40 mg by mouth daily.  09/12/14   Avon Gully, MD  potassium chloride SA (K-DUR,KLOR-CON) 20 MEQ tablet Take 20 mEq by mouth daily.  12/09/14   Historical Provider, MD  warfarin (COUMADIN) 5 MG tablet Take 1 tablet (5 mg total) by mouth daily. 03/24/16   Avon Gully, MD     Review of systems complete and found to be negative unless listed above in HPI     Physical exam Blood pressure 106/65, pulse 60, height  (1.651 m), weight 143 lb (64.9 kg). General: NAD Neck: No JVD, no thyromegaly or thyroid nodule.  Lungs: Clear to auscultation bilaterally with normal respiratory effort. CV: Nondisplaced PMI. Regular rate and rhythm, normal S1/S2, no S3/S4, no murmur.  Trace-1+ pretibial edema, non-pitting.   Abdomen: Soft, nontender, no distention.  Skin: Intact without lesions or rashes.  Neurologic: Alert and oriented.  Psych: Normal affect.   ECG: Most recent ECG reviewed.  Labs:   Lab Results  Component Value Date   WBC 6.4 03/24/2016   HGB 10.5 (L) 03/24/2016   HCT 30.0 (L) 03/24/2016   MCV 88.8 03/24/2016   PLT 200 03/24/2016   No results for input(s): NA, K, CL, CO2, BUN, CREATININE, CALCIUM, PROT, BILITOT, ALKPHOS, ALT, AST, GLUCOSE in the last 168 hours.  Invalid input(s): LABALBU Lab Results  Component Value Date   TROPONINI <0.03 09/01/2014   No results found for: CHOL No results found  for: HDL No results found for: LDLCALC No results found for: TRIG No results found for: CHOLHDL No results found for: LDLDIRECT       Studies: No results found.  ASSESSMENT AND PLAN:  1. Right leg DVT: On warfarin and being managed in our anticoagulation clinic. No obvious provoking events. Should ideally be treated for a minimum of 3 months, but could consider longer duration of therapy.  2. Nonischemic cardiomyopathy: Reportedly resolved by 04/2007, echo EF 55-60%. Continue Coreg. Will repeat echo given bilateral lower extremity edema.  3. Bilateral leg  edema: Will repeat echocardiogram. Encouraged to use compression stockings. No change in Lasix.  Dispo: fu 3 months.  Signed: Prentice Docker, M.D., F.A.C.C.  04/10/2016, 10:33 AM

## 2016-04-11 ENCOUNTER — Ambulatory Visit (INDEPENDENT_AMBULATORY_CARE_PROVIDER_SITE_OTHER): Payer: Medicare HMO | Admitting: *Deleted

## 2016-04-11 DIAGNOSIS — Q211 Atrial septal defect: Secondary | ICD-10-CM | POA: Diagnosis not present

## 2016-04-11 DIAGNOSIS — Z8673 Personal history of transient ischemic attack (TIA), and cerebral infarction without residual deficits: Secondary | ICD-10-CM | POA: Diagnosis not present

## 2016-04-11 DIAGNOSIS — D649 Anemia, unspecified: Secondary | ICD-10-CM | POA: Diagnosis not present

## 2016-04-11 DIAGNOSIS — R739 Hyperglycemia, unspecified: Secondary | ICD-10-CM | POA: Diagnosis not present

## 2016-04-11 DIAGNOSIS — I129 Hypertensive chronic kidney disease with stage 1 through stage 4 chronic kidney disease, or unspecified chronic kidney disease: Secondary | ICD-10-CM | POA: Diagnosis not present

## 2016-04-11 DIAGNOSIS — E039 Hypothyroidism, unspecified: Secondary | ICD-10-CM | POA: Diagnosis not present

## 2016-04-11 DIAGNOSIS — M549 Dorsalgia, unspecified: Secondary | ICD-10-CM | POA: Diagnosis not present

## 2016-04-11 DIAGNOSIS — I82401 Acute embolism and thrombosis of unspecified deep veins of right lower extremity: Secondary | ICD-10-CM | POA: Diagnosis not present

## 2016-04-11 DIAGNOSIS — Z7901 Long term (current) use of anticoagulants: Secondary | ICD-10-CM

## 2016-04-11 DIAGNOSIS — N183 Chronic kidney disease, stage 3 (moderate): Secondary | ICD-10-CM | POA: Diagnosis not present

## 2016-04-11 DIAGNOSIS — I272 Other secondary pulmonary hypertension: Secondary | ICD-10-CM | POA: Diagnosis not present

## 2016-04-11 LAB — POCT INR: INR: 3

## 2016-04-18 ENCOUNTER — Telehealth: Payer: Self-pay | Admitting: Cardiovascular Disease

## 2016-04-18 ENCOUNTER — Ambulatory Visit (INDEPENDENT_AMBULATORY_CARE_PROVIDER_SITE_OTHER): Payer: Medicare HMO | Admitting: *Deleted

## 2016-04-18 DIAGNOSIS — I82401 Acute embolism and thrombosis of unspecified deep veins of right lower extremity: Secondary | ICD-10-CM | POA: Diagnosis not present

## 2016-04-18 DIAGNOSIS — R739 Hyperglycemia, unspecified: Secondary | ICD-10-CM | POA: Diagnosis not present

## 2016-04-18 DIAGNOSIS — I129 Hypertensive chronic kidney disease with stage 1 through stage 4 chronic kidney disease, or unspecified chronic kidney disease: Secondary | ICD-10-CM | POA: Diagnosis not present

## 2016-04-18 DIAGNOSIS — Q211 Atrial septal defect: Secondary | ICD-10-CM | POA: Diagnosis not present

## 2016-04-18 DIAGNOSIS — Z7901 Long term (current) use of anticoagulants: Secondary | ICD-10-CM

## 2016-04-18 DIAGNOSIS — I272 Other secondary pulmonary hypertension: Secondary | ICD-10-CM | POA: Diagnosis not present

## 2016-04-18 DIAGNOSIS — E039 Hypothyroidism, unspecified: Secondary | ICD-10-CM | POA: Diagnosis not present

## 2016-04-18 DIAGNOSIS — M549 Dorsalgia, unspecified: Secondary | ICD-10-CM | POA: Diagnosis not present

## 2016-04-18 DIAGNOSIS — N183 Chronic kidney disease, stage 3 (moderate): Secondary | ICD-10-CM | POA: Diagnosis not present

## 2016-04-18 DIAGNOSIS — Z8673 Personal history of transient ischemic attack (TIA), and cerebral infarction without residual deficits: Secondary | ICD-10-CM | POA: Diagnosis not present

## 2016-04-18 DIAGNOSIS — D649 Anemia, unspecified: Secondary | ICD-10-CM | POA: Diagnosis not present

## 2016-04-18 LAB — POCT INR: INR: 2.9

## 2016-04-18 NOTE — Telephone Encounter (Signed)
Called [facility] with coumadin orders.  They are discharging pt today.  She will have pt's daughter call office to make next INR appointment.

## 2016-04-18 NOTE — Telephone Encounter (Signed)
inr 2.9 °

## 2016-04-20 DIAGNOSIS — I82401 Acute embolism and thrombosis of unspecified deep veins of right lower extremity: Secondary | ICD-10-CM | POA: Diagnosis not present

## 2016-04-25 ENCOUNTER — Ambulatory Visit (INDEPENDENT_AMBULATORY_CARE_PROVIDER_SITE_OTHER): Payer: Medicare HMO | Admitting: *Deleted

## 2016-04-25 DIAGNOSIS — I82402 Acute embolism and thrombosis of unspecified deep veins of left lower extremity: Secondary | ICD-10-CM

## 2016-04-25 DIAGNOSIS — Z7901 Long term (current) use of anticoagulants: Secondary | ICD-10-CM | POA: Diagnosis not present

## 2016-04-25 LAB — POCT INR: INR: 3.2

## 2016-05-01 DIAGNOSIS — R3915 Urgency of urination: Secondary | ICD-10-CM | POA: Diagnosis not present

## 2016-05-01 DIAGNOSIS — N3946 Mixed incontinence: Secondary | ICD-10-CM | POA: Diagnosis not present

## 2016-05-11 ENCOUNTER — Ambulatory Visit (INDEPENDENT_AMBULATORY_CARE_PROVIDER_SITE_OTHER): Payer: Medicare HMO | Admitting: *Deleted

## 2016-05-11 DIAGNOSIS — Z7901 Long term (current) use of anticoagulants: Secondary | ICD-10-CM | POA: Diagnosis not present

## 2016-05-11 DIAGNOSIS — I82402 Acute embolism and thrombosis of unspecified deep veins of left lower extremity: Secondary | ICD-10-CM

## 2016-05-11 LAB — POCT INR: INR: 2

## 2016-05-11 MED ORDER — WARFARIN SODIUM 2.5 MG PO TABS: ORAL_TABLET | ORAL | 3 refills | Status: DC

## 2016-05-25 ENCOUNTER — Other Ambulatory Visit: Payer: Self-pay

## 2016-05-25 ENCOUNTER — Ambulatory Visit (INDEPENDENT_AMBULATORY_CARE_PROVIDER_SITE_OTHER): Payer: Medicare HMO

## 2016-05-25 DIAGNOSIS — I10 Essential (primary) hypertension: Secondary | ICD-10-CM

## 2016-05-25 DIAGNOSIS — I429 Cardiomyopathy, unspecified: Secondary | ICD-10-CM | POA: Diagnosis not present

## 2016-05-31 ENCOUNTER — Encounter: Payer: Self-pay | Admitting: *Deleted

## 2016-06-06 ENCOUNTER — Ambulatory Visit (INDEPENDENT_AMBULATORY_CARE_PROVIDER_SITE_OTHER): Payer: Medicare HMO | Admitting: *Deleted

## 2016-06-06 DIAGNOSIS — I82402 Acute embolism and thrombosis of unspecified deep veins of left lower extremity: Secondary | ICD-10-CM

## 2016-06-06 DIAGNOSIS — Z7901 Long term (current) use of anticoagulants: Secondary | ICD-10-CM

## 2016-06-06 LAB — POCT INR: INR: 2

## 2016-07-04 ENCOUNTER — Ambulatory Visit (INDEPENDENT_AMBULATORY_CARE_PROVIDER_SITE_OTHER): Payer: Medicare HMO | Admitting: *Deleted

## 2016-07-04 DIAGNOSIS — Z7901 Long term (current) use of anticoagulants: Secondary | ICD-10-CM | POA: Diagnosis not present

## 2016-07-04 DIAGNOSIS — I82402 Acute embolism and thrombosis of unspecified deep veins of left lower extremity: Secondary | ICD-10-CM

## 2016-07-04 LAB — POCT INR: INR: 1.9

## 2016-07-17 ENCOUNTER — Ambulatory Visit: Payer: Medicare HMO | Admitting: Cardiovascular Disease

## 2016-07-20 ENCOUNTER — Ambulatory Visit (INDEPENDENT_AMBULATORY_CARE_PROVIDER_SITE_OTHER): Payer: Medicare HMO | Admitting: *Deleted

## 2016-07-20 DIAGNOSIS — I10 Essential (primary) hypertension: Secondary | ICD-10-CM | POA: Diagnosis not present

## 2016-07-20 DIAGNOSIS — I82402 Acute embolism and thrombosis of unspecified deep veins of left lower extremity: Secondary | ICD-10-CM | POA: Diagnosis not present

## 2016-07-20 DIAGNOSIS — I2510 Atherosclerotic heart disease of native coronary artery without angina pectoris: Secondary | ICD-10-CM | POA: Diagnosis not present

## 2016-07-20 DIAGNOSIS — Z7901 Long term (current) use of anticoagulants: Secondary | ICD-10-CM

## 2016-07-20 DIAGNOSIS — I82401 Acute embolism and thrombosis of unspecified deep veins of right lower extremity: Secondary | ICD-10-CM | POA: Diagnosis not present

## 2016-07-20 DIAGNOSIS — E039 Hypothyroidism, unspecified: Secondary | ICD-10-CM | POA: Diagnosis not present

## 2016-07-20 DIAGNOSIS — Z23 Encounter for immunization: Secondary | ICD-10-CM | POA: Diagnosis not present

## 2016-07-20 LAB — POCT INR: INR: 2.6

## 2016-08-11 ENCOUNTER — Ambulatory Visit (INDEPENDENT_AMBULATORY_CARE_PROVIDER_SITE_OTHER): Payer: Medicare HMO | Admitting: Cardiovascular Disease

## 2016-08-11 ENCOUNTER — Encounter: Payer: Self-pay | Admitting: Cardiovascular Disease

## 2016-08-11 VITALS — BP 124/69 | HR 71 | Ht 65.0 in | Wt 147.0 lb

## 2016-08-11 DIAGNOSIS — E039 Hypothyroidism, unspecified: Secondary | ICD-10-CM | POA: Diagnosis not present

## 2016-08-11 DIAGNOSIS — I429 Cardiomyopathy, unspecified: Secondary | ICD-10-CM | POA: Diagnosis not present

## 2016-08-11 DIAGNOSIS — R600 Localized edema: Secondary | ICD-10-CM | POA: Diagnosis not present

## 2016-08-11 DIAGNOSIS — Z7901 Long term (current) use of anticoagulants: Secondary | ICD-10-CM

## 2016-08-11 DIAGNOSIS — I82509 Chronic embolism and thrombosis of unspecified deep veins of unspecified lower extremity: Secondary | ICD-10-CM | POA: Diagnosis not present

## 2016-08-11 DIAGNOSIS — Z6826 Body mass index (BMI) 26.0-26.9, adult: Secondary | ICD-10-CM | POA: Diagnosis not present

## 2016-08-11 DIAGNOSIS — I82401 Acute embolism and thrombosis of unspecified deep veins of right lower extremity: Secondary | ICD-10-CM

## 2016-08-11 DIAGNOSIS — Z136 Encounter for screening for cardiovascular disorders: Secondary | ICD-10-CM

## 2016-08-11 DIAGNOSIS — I10 Essential (primary) hypertension: Secondary | ICD-10-CM | POA: Diagnosis not present

## 2016-08-11 DIAGNOSIS — R69 Illness, unspecified: Secondary | ICD-10-CM | POA: Diagnosis not present

## 2016-08-11 DIAGNOSIS — K219 Gastro-esophageal reflux disease without esophagitis: Secondary | ICD-10-CM | POA: Diagnosis not present

## 2016-08-11 DIAGNOSIS — Z0000 Encounter for general adult medical examination without abnormal findings: Secondary | ICD-10-CM | POA: Diagnosis not present

## 2016-08-11 NOTE — Progress Notes (Signed)
SUBJECTIVE: The patient is an 82 year old woman who was diagnosed with nonocclusive DVT in the right popliteal vein suggestive of partially recanalized chronic DVT on 03/20/16. She is on warfarin.  Additional past medical history includes hypertension, chronic kidney disease, hypothyroidism, and pulmonary hypertension. She also reportedly has a history of a nonischemic cardiomyopathy with EF 25-30% 2007 which improved to 50% in 2008, now 45-50% with grade 1 diastolic dysfunction on 05/25/16.  She denies chest pain and shortness of breath, no PND. She does not like wearing compression stockings.  She has dementia.  She is here with her daughter.  Recently got over gout.    Review of Systems: As per "subjective", otherwise negative.  Allergies  Allergen Reactions  . Aspirin Other (See Comments)    unknown    Current Outpatient Prescriptions  Medication Sig Dispense Refill  . acetaminophen-codeine (TYLENOL #3) 300-30 MG tablet Take 1 tablet by mouth 2 (two) times daily as needed.    . ALPRAZolam (XANAX) 0.5 MG tablet Take 0.5 mg by mouth 2 (two) times daily.    . carvedilol (COREG) 25 MG tablet Take 25 mg by mouth 2 (two) times daily with a meal.     . furosemide (LASIX) 20 MG tablet Take 20 mg by mouth daily. May take an extra tablet for swelling as needed    . gabapentin (NEURONTIN) 100 MG capsule Take 100 mg by mouth 2 (two) times daily.     Marland Kitchen. levothyroxine (SYNTHROID, LEVOTHROID) 75 MCG tablet Take 75 mcg by mouth daily.      Marland Kitchen. lisinopril (PRINIVIL,ZESTRIL) 20 MG tablet Take 1 tablet by mouth daily.  0  . oxybutynin (DITROPAN) 5 MG tablet Take 5 mg by mouth daily.    . pantoprazole (PROTONIX) 40 MG tablet Take 1 tablet (40 mg total) by mouth 2 (two) times daily. (Patient taking differently: Take 40 mg by mouth daily. ) 30 tablet 3  . potassium chloride SA (K-DUR,KLOR-CON) 20 MEQ tablet Take 20 mEq by mouth daily.     . vitamin E 400 UNIT capsule Take 400 Units by mouth  daily.    Marland Kitchen. warfarin (COUMADIN) 2.5 MG tablet Take 1 tablet daily except none on Sundays or as directed 90 tablet 3   No current facility-administered medications for this visit.     Past Medical History:  Diagnosis Date  . Anemia   . ASD (atrial septal defect)    Small  . Cardiomyopathy    Nonischemic, EF 25-30% 2007 / EF improved 50% 2008, ( catheterization not done) nuclear 2007, fixed anteroapical defect, no ischemia  . Chronic back pain greater than 3 months duration   . CKD (chronic kidney disease)   . Diverticulitis   . Edema    Chronic lower extremity edema... venous insufficiency  . Ejection fraction    EF 50%, echo, 2008  . Gout   . Hypertension   . Hypothyroidism   . Multilevel degenerative disc disease   . Pulmonary hypertension   . Stroke [facility](HCC)     Past Surgical History:  Procedure Laterality Date  . ABDOMINAL HYSTERECTOMY    . BREAST SURGERY    . CATARACT EXTRACTION      Social History   Social History  . Marital status: Widowed    Spouse name: N/A  . Number of children: N/A  . Years of education: N/A   Occupational History  . RETIRED Retired   Social History Main Topics  . Smoking status: Never Smoker  .  Smokeless tobacco: Never Used  . Alcohol use No  . Drug use: No  . Sexual activity: Not on file   Other Topics Concern  . Not on file   Social History Narrative  . No narrative on file     Vitals:   08/11/16 1104  BP: 124/69  Pulse: 71  Weight: 147 lb (66.7 kg)  Height: 5\' 5"  (1.651 m)    PHYSICAL EXAM General: NAD Neck: No JVD, no thyromegaly or thyroid nodule.  Lungs: Clear to auscultation bilaterally with normal respiratory effort. CV: Nondisplaced PMI. Regular rate and rhythm, normal S1/S2, no S3/S4, no murmur.  Trace periankle edema, non-pitting.   Abdomen: Soft, nontender, no distention.  Skin: Intact without lesions or rashes.  Neurologic: Alert and oriented.  Psych: Normal affect.    ECG: Most recent ECG  reviewed.      ASSESSMENT AND PLAN: 1. Right leg DVT: On warfarin and being managed in our anticoagulation clinic. No obvious provoking events. Will continue warfarin for an additional month and stop.  2. Nonischemic cardiomyopathy: Continue Coreg. EF 25-30% 2007 which improved to 50% in 2008, now 45-50% with grade 1 diastolic dysfunction on 05/25/16.  3. Bilateral leg edema: No change in Lasix.  Dispo: fu 6 months.   Prentice Docker, M.D., F.A.C.C.

## 2016-08-11 NOTE — Patient Instructions (Signed)
Your physician wants you to follow-up in: 6 MONTHS  You will receive a reminder letter in the mail two months in advance. If you don't receive a letter, please call our office to schedule the follow-up appointment.  Your physician has recommended you make the following change in your medication:    STOP WARFARIN IN 1 MONTH  Thank you for choosing  [facility]!!

## 2016-08-17 ENCOUNTER — Ambulatory Visit (INDEPENDENT_AMBULATORY_CARE_PROVIDER_SITE_OTHER): Payer: Medicare HMO | Admitting: *Deleted

## 2016-08-17 DIAGNOSIS — I82402 Acute embolism and thrombosis of unspecified deep veins of left lower extremity: Secondary | ICD-10-CM

## 2016-08-17 DIAGNOSIS — Z7901 Long term (current) use of anticoagulants: Secondary | ICD-10-CM

## 2016-08-17 LAB — POCT INR: INR: 3

## 2016-08-21 ENCOUNTER — Telehealth: Payer: Self-pay | Admitting: *Deleted

## 2016-08-21 NOTE — Telephone Encounter (Signed)
Patient just changed pharmacies from [facility] to [facility].  [facility] wants permission to change to different manufacturer on her Warfarin.

## 2016-08-21 NOTE — Telephone Encounter (Signed)
OK to change manufacturer called to [facility].

## 2016-10-27 DIAGNOSIS — I2510 Atherosclerotic heart disease of native coronary artery without angina pectoris: Secondary | ICD-10-CM | POA: Diagnosis not present

## 2016-10-27 DIAGNOSIS — I10 Essential (primary) hypertension: Secondary | ICD-10-CM | POA: Diagnosis not present

## 2016-10-27 DIAGNOSIS — I5022 Chronic systolic (congestive) heart failure: Secondary | ICD-10-CM | POA: Diagnosis not present

## 2016-10-27 DIAGNOSIS — E039 Hypothyroidism, unspecified: Secondary | ICD-10-CM | POA: Diagnosis not present

## 2016-12-19 DIAGNOSIS — N390 Urinary tract infection, site not specified: Secondary | ICD-10-CM | POA: Diagnosis not present

## 2016-12-19 DIAGNOSIS — R300 Dysuria: Secondary | ICD-10-CM | POA: Diagnosis not present

## 2016-12-19 DIAGNOSIS — I10 Essential (primary) hypertension: Secondary | ICD-10-CM | POA: Diagnosis not present

## 2017-03-06 DIAGNOSIS — H524 Presbyopia: Secondary | ICD-10-CM | POA: Diagnosis not present

## 2017-03-06 DIAGNOSIS — H35033 Hypertensive retinopathy, bilateral: Secondary | ICD-10-CM | POA: Diagnosis not present

## 2017-03-06 IMAGING — CR DG ANKLE COMPLETE 3+V*L*
1 series · 3 of 3 positions shown · non-contrast
Comparison: None.

CLINICAL DATA: Pain and swelling

EXAM:
LEFT ANKLE COMPLETE - 3+ VIEW

[Series 2: oblique · 0.17mm/px · 3 of 3 slices shown]
[im 1/3]
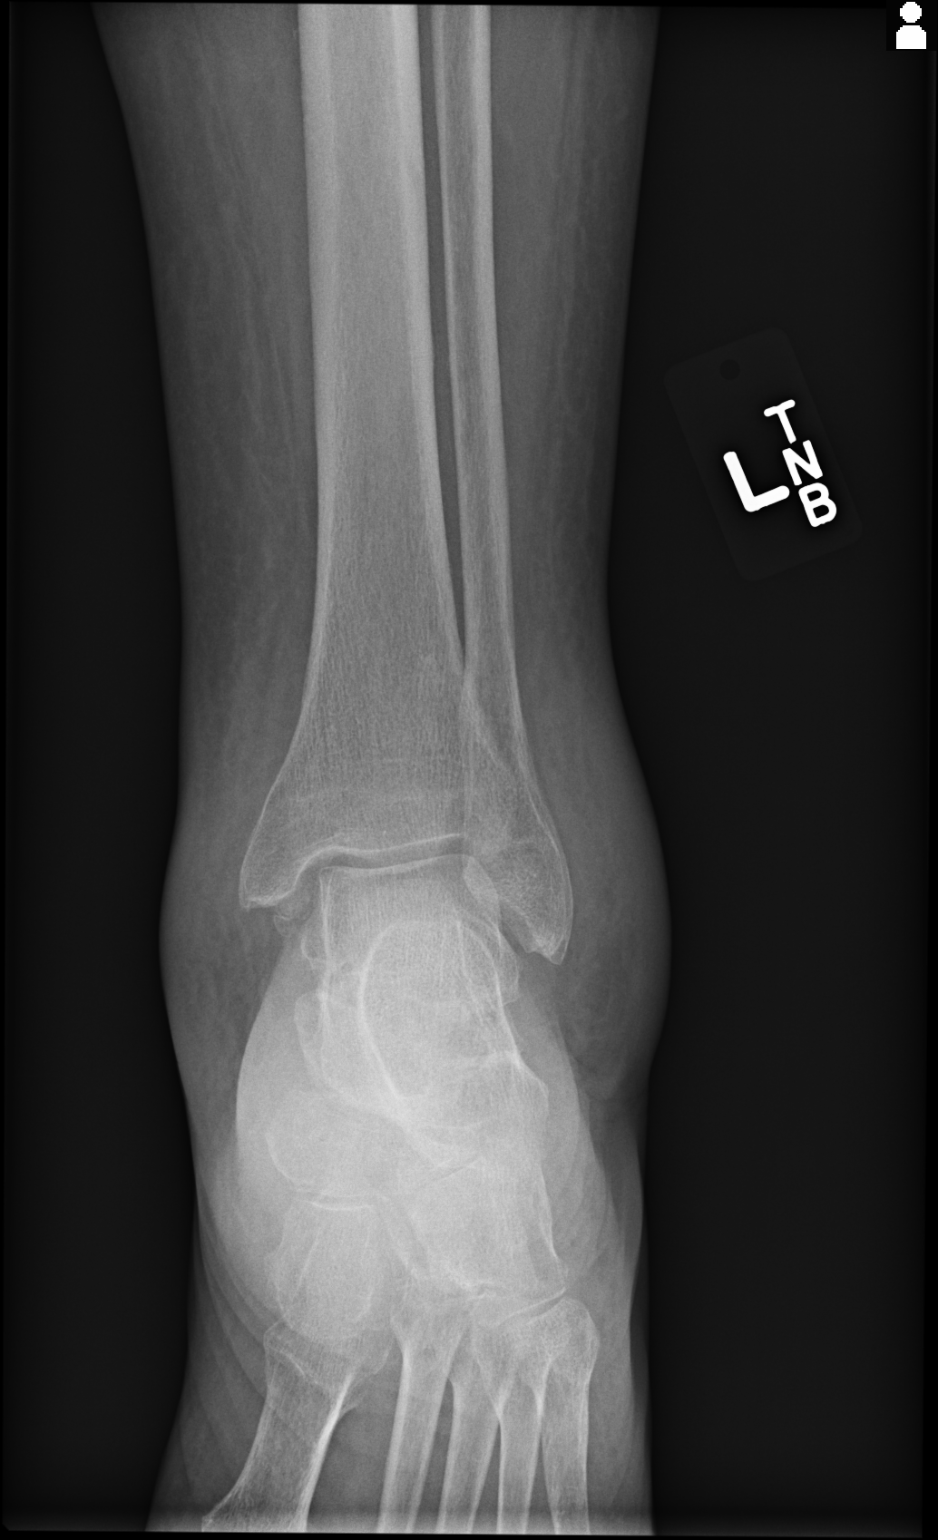
[im 2/3]
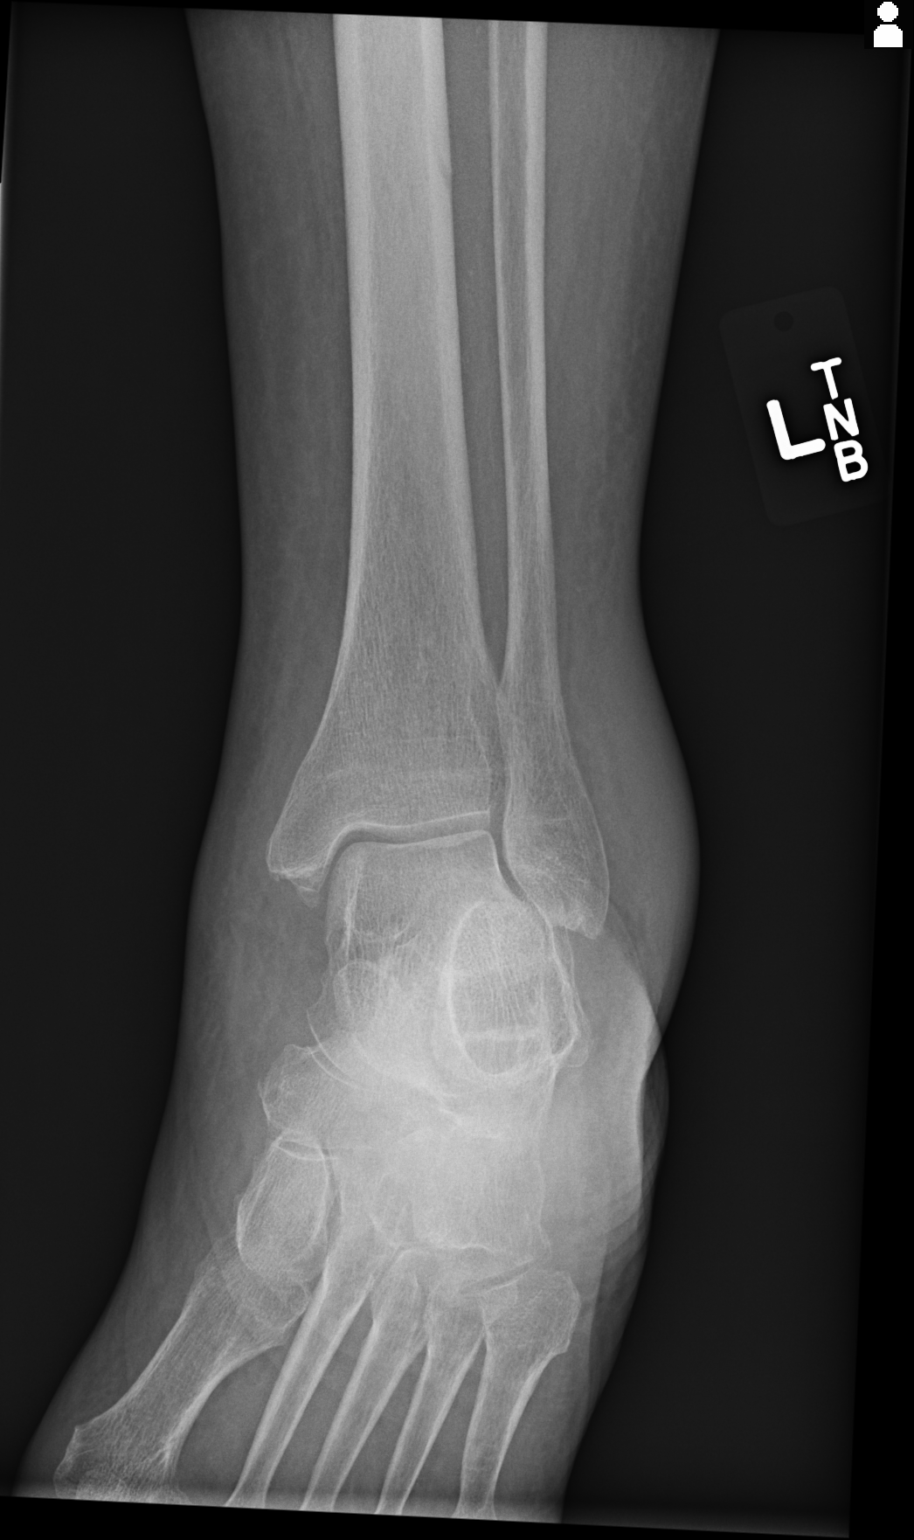
[im 3/3]
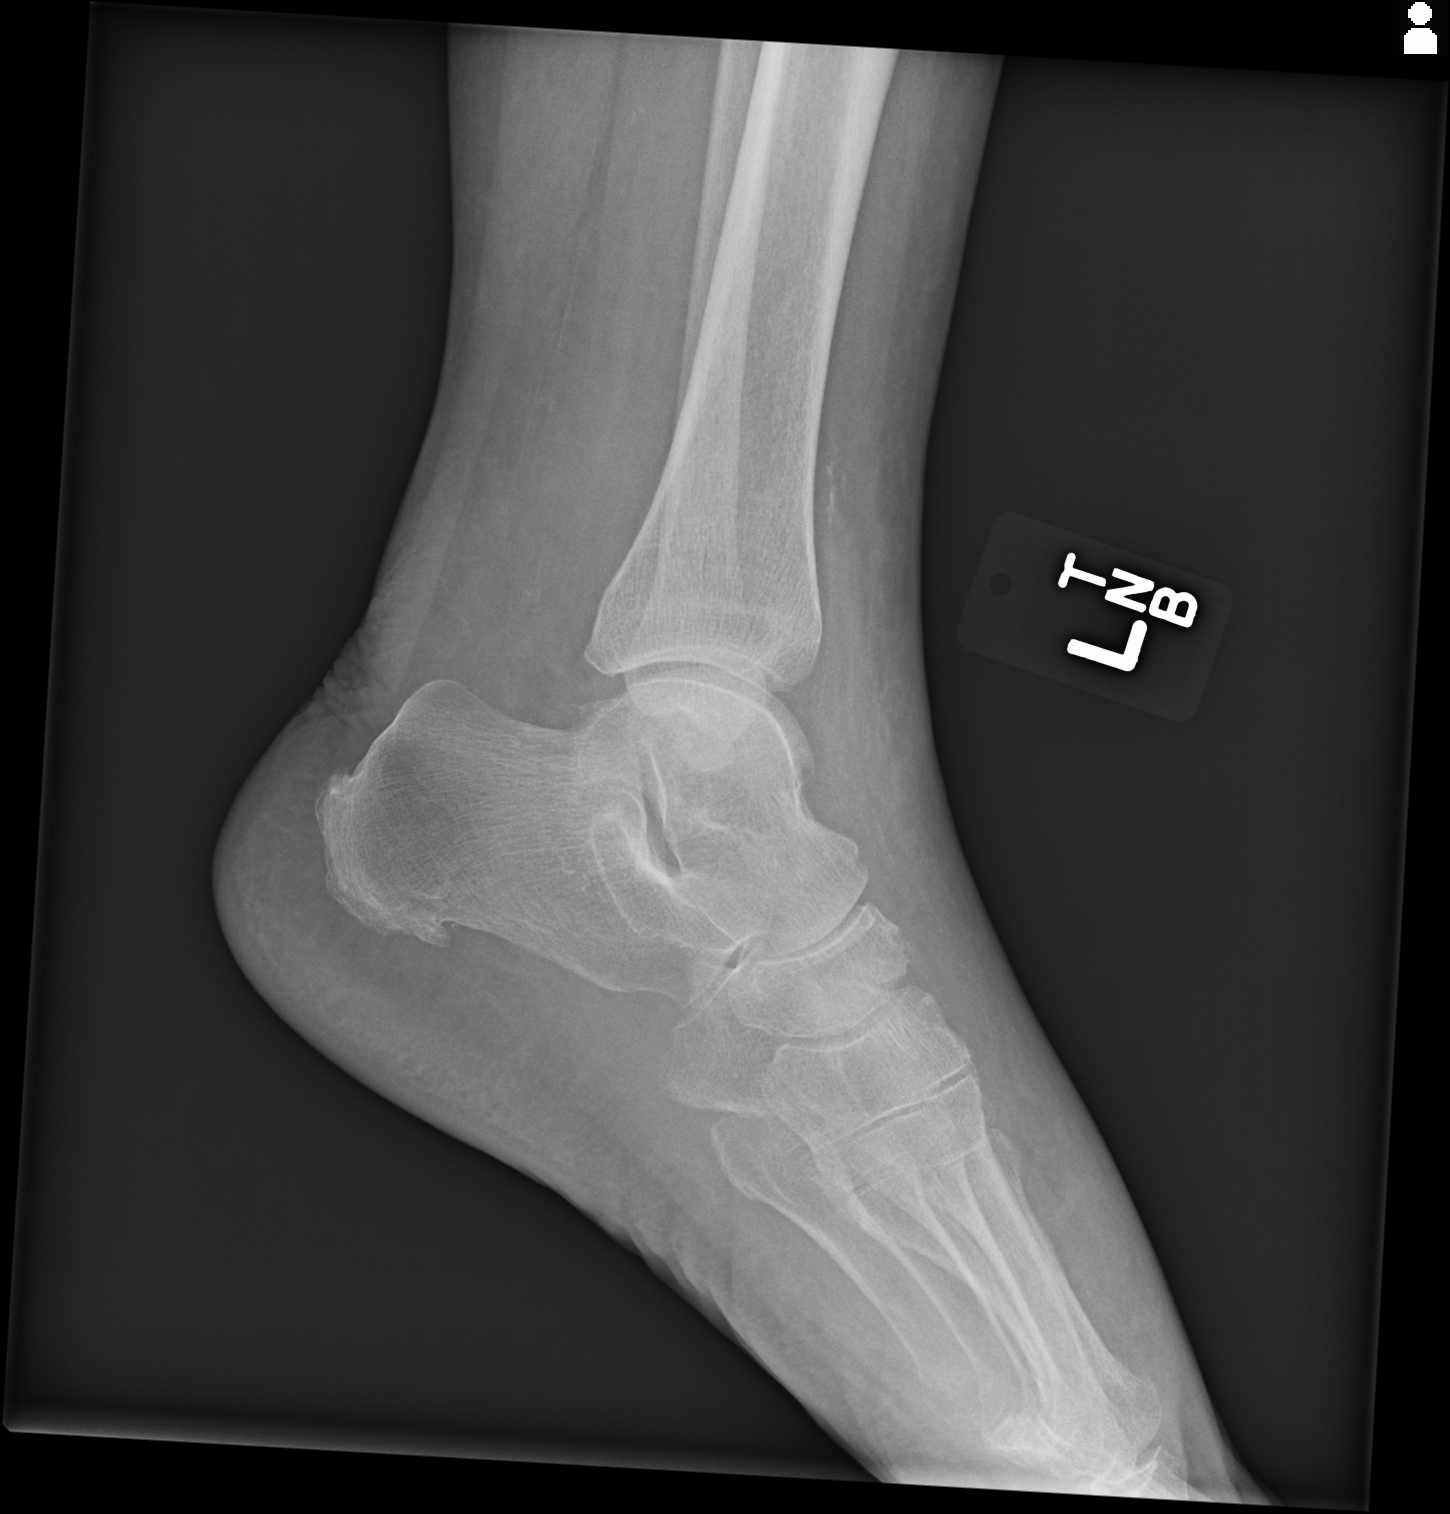

[3 of 3 positions shown; findings below may reference images not displayed]

FINDINGS: Frontal, oblique, and lateral views were obtained. There is
generalized soft tissue swelling. There is a questionable old
avulsion along the medial malleolus. No acute fracture is evident.
No joint effusion. The ankle mortise appears intact. There are spurs
arising from the posterior inferior calcaneus. Joint spaces appear
normal. There are scattered foci of arterial vascular calcification
in the anterior tibial artery.
IMPRESSION: Generalized soft tissue swelling. Question old injury medial
malleolus. No acute fracture. Ankle mortise appears intact. There
are calcaneal spurs. There is atherosclerotic calcification in the
anterior tibial artery.

## 2017-03-20 DIAGNOSIS — I10 Essential (primary) hypertension: Secondary | ICD-10-CM | POA: Diagnosis not present

## 2017-03-20 DIAGNOSIS — I5022 Chronic systolic (congestive) heart failure: Secondary | ICD-10-CM | POA: Diagnosis not present

## 2017-03-20 DIAGNOSIS — E039 Hypothyroidism, unspecified: Secondary | ICD-10-CM | POA: Diagnosis not present

## 2017-03-20 DIAGNOSIS — I2510 Atherosclerotic heart disease of native coronary artery without angina pectoris: Secondary | ICD-10-CM | POA: Diagnosis not present

## 2017-03-29 DIAGNOSIS — Z6825 Body mass index (BMI) 25.0-25.9, adult: Secondary | ICD-10-CM | POA: Diagnosis not present

## 2017-03-29 DIAGNOSIS — E039 Hypothyroidism, unspecified: Secondary | ICD-10-CM | POA: Diagnosis not present

## 2017-03-29 DIAGNOSIS — R600 Localized edema: Secondary | ICD-10-CM | POA: Diagnosis not present

## 2017-03-29 DIAGNOSIS — Z86718 Personal history of other venous thrombosis and embolism: Secondary | ICD-10-CM | POA: Diagnosis not present

## 2017-03-29 DIAGNOSIS — I10 Essential (primary) hypertension: Secondary | ICD-10-CM | POA: Diagnosis not present

## 2017-03-29 DIAGNOSIS — K219 Gastro-esophageal reflux disease without esophagitis: Secondary | ICD-10-CM | POA: Diagnosis not present

## 2017-03-29 DIAGNOSIS — K002 Abnormalities of size and form of teeth: Secondary | ICD-10-CM | POA: Diagnosis not present

## 2017-03-29 DIAGNOSIS — Z0000 Encounter for general adult medical examination without abnormal findings: Secondary | ICD-10-CM | POA: Diagnosis not present

## 2017-03-29 DIAGNOSIS — R69 Illness, unspecified: Secondary | ICD-10-CM | POA: Diagnosis not present

## 2017-03-29 DIAGNOSIS — M792 Neuralgia and neuritis, unspecified: Secondary | ICD-10-CM | POA: Diagnosis not present

## 2017-06-06 ENCOUNTER — Encounter: Payer: Self-pay | Admitting: *Deleted

## 2017-06-06 DIAGNOSIS — I10 Essential (primary) hypertension: Secondary | ICD-10-CM | POA: Diagnosis not present

## 2017-06-06 DIAGNOSIS — Z23 Encounter for immunization: Secondary | ICD-10-CM | POA: Diagnosis not present

## 2017-06-06 DIAGNOSIS — I5022 Chronic systolic (congestive) heart failure: Secondary | ICD-10-CM | POA: Diagnosis not present

## 2017-06-06 DIAGNOSIS — R600 Localized edema: Secondary | ICD-10-CM | POA: Diagnosis not present

## 2017-06-06 DIAGNOSIS — R269 Unspecified abnormalities of gait and mobility: Secondary | ICD-10-CM | POA: Diagnosis not present

## 2017-06-07 ENCOUNTER — Ambulatory Visit (INDEPENDENT_AMBULATORY_CARE_PROVIDER_SITE_OTHER): Payer: Medicare HMO | Admitting: Cardiovascular Disease

## 2017-06-07 ENCOUNTER — Encounter: Payer: Self-pay | Admitting: Cardiovascular Disease

## 2017-06-07 VITALS — BP 118/62 | HR 83 | Ht 64.0 in | Wt 154.0 lb

## 2017-06-07 DIAGNOSIS — I82401 Acute embolism and thrombosis of unspecified deep veins of right lower extremity: Secondary | ICD-10-CM | POA: Diagnosis not present

## 2017-06-07 DIAGNOSIS — I10 Essential (primary) hypertension: Secondary | ICD-10-CM | POA: Diagnosis not present

## 2017-06-07 DIAGNOSIS — I429 Cardiomyopathy, unspecified: Secondary | ICD-10-CM

## 2017-06-07 DIAGNOSIS — R600 Localized edema: Secondary | ICD-10-CM

## 2017-06-07 NOTE — Patient Instructions (Signed)

## 2017-06-07 NOTE — Progress Notes (Signed)
SUBJECTIVE: The patient is a 83 year old woman who was diagnosed with nonocclusive DVT in the right popliteal vein suggestive of partiallyrecanalized chronic DVT on 03/20/16. She had been on warfarin.  Additional past medical history includes hypertension, chronic kidney disease, hypothyroidism, and pulmonary hypertension. She also reportedly has a history of a nonischemic cardiomyopathy with EF 25-30% 2007 which improved to 50% in 2008, now 45-50% with grade 1 diastolic dysfunction on 05/25/16.  ECG performed in the office today which I ordered and personally interpreted demonstrated sinus rhythm with PACs and a nonspecific T-wave abnormality seen diffusely.  She is feeling well and denies chest pain. She only gets short of breath if she rushes around too much. She cooks occasionally. 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 Her daughter who is with her, Suzette Battiest, now does this for her.  She has chronic nonpitting lower extremity edema. She takes Lasix.   Review of Systems: As per "subjective", otherwise negative.  Allergies  Allergen Reactions  . Aspirin Other (See Comments)    unknown    Current Outpatient Prescriptions  Medication Sig Dispense Refill  . ALPRAZolam (XANAX) 0.5 MG tablet Take 0.5 mg by mouth 2 (two) times daily.    . carvedilol (COREG) 25 MG tablet Take 25 mg by mouth 2 (two) times daily with a meal.     . furosemide (LASIX) 20 MG tablet Take 20 mg by mouth daily. May take an extra tablet for swelling as needed    . gabapentin (NEURONTIN) 100 MG capsule Take 100 mg by mouth 2 (two) times daily.     Marland Kitchen levothyroxine (SYNTHROID, LEVOTHROID) 75 MCG tablet Take 75 mcg by mouth daily.      Marland Kitchen lisinopril (PRINIVIL,ZESTRIL) 20 MG tablet Take 1 tablet by mouth daily.  0  . pantoprazole (PROTONIX) 40 MG tablet Take 1 tablet (40 mg total) by mouth 2 (two) times daily. (Patient taking differently: Take 40 mg by mouth daily. ) 30 tablet 3  .  potassium chloride SA (K-DUR,KLOR-CON) 20 MEQ tablet Take 20 mEq by mouth daily.     . vitamin E 400 UNIT capsule Take 400 Units by mouth daily.    Marland Kitchen acetaminophen-codeine (TYLENOL #3) 300-30 MG tablet Take 1 tablet by mouth 2 (two) times daily as needed.    Marland Kitchen oxybutynin (DITROPAN) 5 MG tablet Take 5 mg by mouth daily.     No current facility-administered medications for this visit.     Past Medical History:  Diagnosis Date  . Anemia   . ASD (atrial septal defect)    Small  . Cardiomyopathy    Nonischemic, EF 25-30% 2007 / EF improved 50% 2008, ( catheterization not done) nuclear 2007, fixed anteroapical defect, no ischemia  . Chronic back pain greater than 3 months duration   . CKD (chronic kidney disease)   . Diverticulitis   . Edema    Chronic lower extremity edema... venous insufficiency  . Ejection fraction    EF 50%, echo, 2008  . Gout   . Hypertension   . Hypothyroidism   . Multilevel degenerative disc disease   . Pulmonary hypertension (HCC)   . Stroke [facility])     Past Surgical History:  Procedure Laterality Date  . ABDOMINAL HYSTERECTOMY    . BREAST SURGERY    . CATARACT EXTRACTION      Social History   Social History  . Marital status: Widowed    Spouse name: N/A  .  Number of children: N/A  . Years of education: N/A   Occupational History  . RETIRED Retired   Social History Main Topics  . Smoking status: Never Smoker  . Smokeless tobacco: Never Used  . Alcohol use No  . Drug use: No  . Sexual activity: Not on file   Other Topics Concern  . Not on file   Social History Narrative  . No narrative on file     Vitals:   06/07/17 1013  BP: 118/62  Pulse: 83  SpO2: 98%  Weight: 154 lb (69.9 kg)  Height: 5\' 4"  (1.626 m)    Wt Readings from Last 3 Encounters:  06/07/17 154 lb (69.9 kg)  08/11/16 147 lb (66.7 kg)  04/10/16 143 lb (64.9 kg)     PHYSICAL EXAM General: NAD HEENT: Normal. Neck: No JVD, no thyromegaly. Lungs: Clear to  auscultation bilaterally with normal respiratory effort. CV: Nondisplaced PMI.  Regular rate and rhythm, normal S1/S2, no S3/S4, no murmur. Nonpitting lower extremity edema bilaterally.  No carotid bruit.   Abdomen: Soft, nontender, no distention.  Neurologic: Alert and oriented.  Psych: Normal affect. Skin: Normal. Musculoskeletal: No gross deformities.    ECG: Most recent ECG reviewed.   Labs: Lab Results  Component Value Date/Time   K 3.4 (L) 03/22/2016 04:20 AM   BUN 20 03/22/2016 04:20 AM   CREATININE 1.23 (H) 03/22/2016 04:20 AM   ALT 7 (L) 03/21/2016 06:43 AM   TSH 1.170 02/10/2015 10:37 AM   TSH 0.073 (L) 09/07/2014 11:23 AM   HGB 10.5 (L) 03/24/2016 04:07 AM     Lipids: No results found for: LDLCALC, LDLDIRECT, CHOL, TRIG, HDL     ASSESSMENT AND PLAN:  1. Right leg DVT: No longer on warfarin. Stable.  2. Nonischemic cardiomyopathy: Symptomatically stable without signs of heart failure. Continue Coreg and lisinopril. EF 25-30% 2007 which improved to 50% in 2008, now 45-50% with grade 1 diastolic dysfunction on 05/25/16.  3. Bilateral leg edema: Continue Lasix. I encouraged leg elevation.     Disposition: Follow up 1 year.   Prentice Docker, M.D., F.A.C.C.

## 2017-08-06 DIAGNOSIS — Z0001 Encounter for general adult medical examination with abnormal findings: Secondary | ICD-10-CM | POA: Diagnosis not present

## 2017-08-06 DIAGNOSIS — I10 Essential (primary) hypertension: Secondary | ICD-10-CM | POA: Diagnosis not present

## 2017-08-06 DIAGNOSIS — E039 Hypothyroidism, unspecified: Secondary | ICD-10-CM | POA: Diagnosis not present

## 2017-08-06 DIAGNOSIS — I2510 Atherosclerotic heart disease of native coronary artery without angina pectoris: Secondary | ICD-10-CM | POA: Diagnosis not present

## 2017-08-06 DIAGNOSIS — E785 Hyperlipidemia, unspecified: Secondary | ICD-10-CM | POA: Diagnosis not present

## 2017-08-06 DIAGNOSIS — Z1389 Encounter for screening for other disorder: Secondary | ICD-10-CM | POA: Diagnosis not present

## 2017-08-06 DIAGNOSIS — I5022 Chronic systolic (congestive) heart failure: Secondary | ICD-10-CM | POA: Diagnosis not present

## 2017-08-06 DIAGNOSIS — M1990 Unspecified osteoarthritis, unspecified site: Secondary | ICD-10-CM | POA: Diagnosis not present

## 2017-08-06 DIAGNOSIS — R600 Localized edema: Secondary | ICD-10-CM | POA: Diagnosis not present

## 2017-08-06 DIAGNOSIS — Z0000 Encounter for general adult medical examination without abnormal findings: Secondary | ICD-10-CM | POA: Diagnosis not present

## 2017-09-25 DIAGNOSIS — E039 Hypothyroidism, unspecified: Secondary | ICD-10-CM | POA: Diagnosis not present

## 2017-09-25 DIAGNOSIS — R269 Unspecified abnormalities of gait and mobility: Secondary | ICD-10-CM | POA: Diagnosis not present

## 2017-09-25 DIAGNOSIS — I5022 Chronic systolic (congestive) heart failure: Secondary | ICD-10-CM | POA: Diagnosis not present

## 2017-09-25 DIAGNOSIS — I10 Essential (primary) hypertension: Secondary | ICD-10-CM | POA: Diagnosis not present

## 2017-10-29 DIAGNOSIS — I5022 Chronic systolic (congestive) heart failure: Secondary | ICD-10-CM | POA: Diagnosis not present

## 2017-10-29 DIAGNOSIS — R42 Dizziness and giddiness: Secondary | ICD-10-CM | POA: Diagnosis not present

## 2017-12-20 DIAGNOSIS — I10 Essential (primary) hypertension: Secondary | ICD-10-CM | POA: Diagnosis not present

## 2017-12-20 DIAGNOSIS — R600 Localized edema: Secondary | ICD-10-CM | POA: Diagnosis not present

## 2017-12-20 DIAGNOSIS — R269 Unspecified abnormalities of gait and mobility: Secondary | ICD-10-CM | POA: Diagnosis not present

## 2017-12-20 DIAGNOSIS — I110 Hypertensive heart disease with heart failure: Secondary | ICD-10-CM | POA: Diagnosis not present

## 2017-12-20 DIAGNOSIS — I2510 Atherosclerotic heart disease of native coronary artery without angina pectoris: Secondary | ICD-10-CM | POA: Diagnosis not present

## 2017-12-20 DIAGNOSIS — I429 Cardiomyopathy, unspecified: Secondary | ICD-10-CM | POA: Diagnosis not present

## 2017-12-20 DIAGNOSIS — M1990 Unspecified osteoarthritis, unspecified site: Secondary | ICD-10-CM | POA: Diagnosis not present

## 2017-12-20 DIAGNOSIS — E039 Hypothyroidism, unspecified: Secondary | ICD-10-CM | POA: Diagnosis not present

## 2017-12-20 DIAGNOSIS — I82401 Acute embolism and thrombosis of unspecified deep veins of right lower extremity: Secondary | ICD-10-CM | POA: Diagnosis not present

## 2017-12-20 DIAGNOSIS — I5022 Chronic systolic (congestive) heart failure: Secondary | ICD-10-CM | POA: Diagnosis not present

## 2017-12-20 DIAGNOSIS — E785 Hyperlipidemia, unspecified: Secondary | ICD-10-CM | POA: Diagnosis not present

## 2017-12-31 DIAGNOSIS — G8929 Other chronic pain: Secondary | ICD-10-CM | POA: Diagnosis not present

## 2017-12-31 DIAGNOSIS — N189 Chronic kidney disease, unspecified: Secondary | ICD-10-CM | POA: Diagnosis not present

## 2017-12-31 DIAGNOSIS — E039 Hypothyroidism, unspecified: Secondary | ICD-10-CM | POA: Diagnosis not present

## 2017-12-31 DIAGNOSIS — I509 Heart failure, unspecified: Secondary | ICD-10-CM | POA: Diagnosis not present

## 2017-12-31 DIAGNOSIS — K08409 Partial loss of teeth, unspecified cause, unspecified class: Secondary | ICD-10-CM | POA: Diagnosis not present

## 2017-12-31 DIAGNOSIS — R69 Illness, unspecified: Secondary | ICD-10-CM | POA: Diagnosis not present

## 2017-12-31 DIAGNOSIS — I130 Hypertensive heart and chronic kidney disease with heart failure and stage 1 through stage 4 chronic kidney disease, or unspecified chronic kidney disease: Secondary | ICD-10-CM | POA: Diagnosis not present

## 2017-12-31 DIAGNOSIS — K219 Gastro-esophageal reflux disease without esophagitis: Secondary | ICD-10-CM | POA: Diagnosis not present

## 2017-12-31 DIAGNOSIS — G629 Polyneuropathy, unspecified: Secondary | ICD-10-CM | POA: Diagnosis not present

## 2018-02-04 DIAGNOSIS — E039 Hypothyroidism, unspecified: Secondary | ICD-10-CM | POA: Diagnosis not present

## 2018-02-04 DIAGNOSIS — I10 Essential (primary) hypertension: Secondary | ICD-10-CM | POA: Diagnosis not present

## 2018-02-04 DIAGNOSIS — R269 Unspecified abnormalities of gait and mobility: Secondary | ICD-10-CM | POA: Diagnosis not present

## 2018-02-04 DIAGNOSIS — I5022 Chronic systolic (congestive) heart failure: Secondary | ICD-10-CM | POA: Diagnosis not present

## 2018-04-03 DIAGNOSIS — R2681 Unsteadiness on feet: Secondary | ICD-10-CM | POA: Diagnosis not present

## 2018-05-07 DIAGNOSIS — E039 Hypothyroidism, unspecified: Secondary | ICD-10-CM | POA: Diagnosis not present

## 2018-05-07 DIAGNOSIS — I2510 Atherosclerotic heart disease of native coronary artery without angina pectoris: Secondary | ICD-10-CM | POA: Diagnosis not present

## 2018-05-07 DIAGNOSIS — I5022 Chronic systolic (congestive) heart failure: Secondary | ICD-10-CM | POA: Diagnosis not present

## 2018-05-07 DIAGNOSIS — I10 Essential (primary) hypertension: Secondary | ICD-10-CM | POA: Diagnosis not present

## 2018-05-30 DIAGNOSIS — H35033 Hypertensive retinopathy, bilateral: Secondary | ICD-10-CM | POA: Diagnosis not present

## 2018-05-30 DIAGNOSIS — H524 Presbyopia: Secondary | ICD-10-CM | POA: Diagnosis not present

## 2018-06-19 DIAGNOSIS — Z23 Encounter for immunization: Secondary | ICD-10-CM | POA: Diagnosis not present

## 2018-07-04 ENCOUNTER — Emergency Department (HOSPITAL_COMMUNITY): Payer: Medicare HMO

## 2018-07-04 ENCOUNTER — Other Ambulatory Visit: Payer: Self-pay

## 2018-07-04 ENCOUNTER — Encounter (HOSPITAL_COMMUNITY): Payer: Self-pay | Admitting: Emergency Medicine

## 2018-07-04 ENCOUNTER — Emergency Department (HOSPITAL_COMMUNITY)
Admission: EM | Admit: 2018-07-04 | Discharge: 2018-07-04 | Disposition: A | Discharge status: Home / Self Care | Payer: Medicare HMO | Attending: Emergency Medicine | Admitting: Emergency Medicine

## 2018-07-04 DIAGNOSIS — I129 Hypertensive chronic kidney disease with stage 1 through stage 4 chronic kidney disease, or unspecified chronic kidney disease: Secondary | ICD-10-CM | POA: Insufficient documentation

## 2018-07-04 DIAGNOSIS — Z8673 Personal history of transient ischemic attack (TIA), and cerebral infarction without residual deficits: Secondary | ICD-10-CM | POA: Insufficient documentation

## 2018-07-04 DIAGNOSIS — Z79899 Other long term (current) drug therapy: Secondary | ICD-10-CM | POA: Insufficient documentation

## 2018-07-04 DIAGNOSIS — N183 Chronic kidney disease, stage 3 (moderate): Secondary | ICD-10-CM | POA: Diagnosis not present

## 2018-07-04 DIAGNOSIS — M25532 Pain in left wrist: Secondary | ICD-10-CM | POA: Diagnosis not present

## 2018-07-04 DIAGNOSIS — E039 Hypothyroidism, unspecified: Secondary | ICD-10-CM | POA: Insufficient documentation

## 2018-07-04 DIAGNOSIS — R600 Localized edema: Secondary | ICD-10-CM | POA: Diagnosis not present

## 2018-07-04 MED ORDER — PREDNISONE 20 MG PO TABS
40.0000 mg | ORAL_TABLET | Freq: Once | ORAL | Status: AC
  Administered 2018-07-04: 40 mg via ORAL
  Filled 2018-07-04: qty 2

## 2018-07-04 MED ORDER — PREDNISONE 20 MG PO TABS: 20.0000 mg | ORAL_TABLET | Freq: Every day | ORAL | 0 refills | Status: DC

## 2018-07-04 NOTE — Discharge Instructions (Signed)
If your symptoms get worse with increasing pain swelling or fever please return to the emergency department immediately.  Otherwise you can follow-up with your doctor within the next 48 hours for a recheck.  This looks like gout.  Prednisone, 1 tablet a day for the next 7 days, you may also take Tylenol or ibuprofen 3 times a day as needed  Wear the splint for stability so that you do not have pain when moving her arm

## 2018-07-04 NOTE — ED Provider Notes (Signed)
[facility] EMERGENCY DEPARTMENT Provider Note   CSN: [id number] Arrival date & time: 07/04/18  1756     History   Chief Complaint Chief Complaint  Patient presents with  . Arm Pain    HPI [name] is a 84 y.o. female.  HPI  84 year old female, she has a known history of some chronic kidney disease, she has chronic lower extremity edema and has a reported history of gout in her medical record.  The patient is unsure if she is actually ever had an exacerbation when I asked her, she has gout in the family with multiple family members who have it.  She reports that she had her flu shot in the shoulder a couple of weeks ago, starting several days ago she noticed some swelling of her left distal forearm and wrist with was atraumatic, she is not having fevers, it is worse with palpation and movement.  She has had no medications prior to arrival.  She denies fevers redness warmth or any recent infections.  Past Medical History:  Diagnosis Date  . Anemia   . ASD (atrial septal defect)    Small  . Cardiomyopathy    Nonischemic, EF 25-30% 2007 / EF improved 50% 2008, ( catheterization not done) nuclear 2007, fixed anteroapical defect, no ischemia  . Chronic back pain greater than 3 months duration   . CKD (chronic kidney disease)   . Diverticulitis   . Edema    Chronic lower extremity edema... venous insufficiency  . Ejection fraction    EF 50%, echo, 2008  . Gout   . Hypertension   . Hypothyroidism   . Multilevel degenerative disc disease   . Pulmonary hypertension (HCC)   . Stroke [facility])     Patient Active Problem List   Diagnosis Date Noted  . Long term (current) use of anticoagulants [Z79.01] 03/27/2016  . DVT (deep venous thrombosis) (HCC) 03/20/2016  . CKD (chronic kidney disease) stage 3, GFR 30-59 ml/min (HCC) 03/20/2016  . Hyperglycemia 03/20/2016  . Acute on chronic renal failure (HCC) 02/10/2015  . Intractable back pain 02/10/2015  . Anemia   . Chronic  back pain greater than 3 months duration   . CKD (chronic kidney disease)   . Cardiomyopathy (HCC)   . Lumbar radiculopathy, acute   . Colitis 09/07/2014  . Acute renal failure (HCC) 09/07/2014  . Nonischemic cardiomyopathy (HCC)   . Ejection fraction   . Hypertension   . Hypothyroidism   . ASD (atrial septal defect)   . Edema   . Pulmonary hypertension (HCC)     Past Surgical History:  Procedure Laterality Date  . ABDOMINAL HYSTERECTOMY    . BREAST SURGERY    . CATARACT EXTRACTION       OB History   None      Home Medications    Prior to Admission medications   Medication Sig Start Date End Date Taking? Authorizing Provider  acetaminophen-codeine (TYLENOL #3) 300-30 MG tablet Take 1 tablet by mouth 2 (two) times daily as needed. 08/09/16   [provider]  ALPRAZolam Prudy Feeler) 0.5 MG tablet Take 0.5 mg by mouth 2 (two) times daily.    [provider]  carvedilol (COREG) 25 MG tablet Take 25 mg by mouth 2 (two) times daily with a meal.     [provider]  furosemide (LASIX) 20 MG tablet Take 20 mg by mouth daily. May take an extra tablet for swelling as needed    [provider]  gabapentin (NEURONTIN) 100 MG capsule Take 100 mg by mouth 2 (two) times daily.  12/09/14   [provider]  levothyroxine (SYNTHROID, LEVOTHROID) 75 MCG tablet Take 75 mcg by mouth daily.      [provider]  lisinopril (PRINIVIL,ZESTRIL) 20 MG tablet Take 1 tablet by mouth daily. 05/17/16   [provider]  oxybutynin (DITROPAN) 5 MG tablet Take 5 mg by mouth daily.    [provider]  pantoprazole (PROTONIX) 40 MG tablet Take 1 tablet (40 mg total) by mouth 2 (two) times daily. Patient taking differently: Take 40 mg by mouth daily.  09/12/14   Avon Gully, MD  potassium chloride SA (K-DUR,KLOR-CON) 20 MEQ tablet Take 20 mEq by mouth daily.  12/09/14   [provider]  predniSONE (DELTASONE) 20 MG tablet Take 1 tablet (20  mg total) by mouth daily for 7 days. 07/04/18 07/11/18  Eber Hong, MD  vitamin E 400 UNIT capsule Take 400 Units by mouth daily.    [provider]    Family History Family History  Problem Relation Age of Onset  . Heart attack Mother   . Leukemia Father   . Coronary artery disease Unknown        POSITIVE HX IN FAMILY    Social History Social History   Tobacco Use  . Smoking status: Never Smoker  . Smokeless tobacco: Never Used  Substance Use Topics  . Alcohol use: No  . Drug use: No     Allergies   Aspirin   Review of Systems Review of Systems  Constitutional: Negative for fever.  Musculoskeletal: Positive for arthralgias and joint swelling.     Physical Exam Updated Vital Signs BP (!) 190/99   Pulse 74   Ht 1.575 m (5\' 2" )   Wt 72.6 kg   BMI 29.26 kg/m   Physical Exam  Constitutional: She appears well-developed and well-nourished. No distress.  HENT:  Head: Normocephalic and atraumatic.  Mouth/Throat: Oropharynx is clear and moist. No oropharyngeal exudate.  Eyes: Pupils are equal, round, and reactive to light. Conjunctivae and EOM are normal. Right eye exhibits no discharge. Left eye exhibits no discharge. No scleral icterus.  Neck: Normal range of motion. Neck supple. No JVD present. No thyromegaly present.  Cardiovascular: Normal rate, regular rhythm, normal heart sounds and intact distal pulses. Exam reveals no gallop and no friction rub.  No murmur heard. Pulmonary/Chest: Effort normal and breath sounds normal. No respiratory distress. She has no wheezes. She has no rales.  Abdominal: Soft. Bowel sounds are normal. She exhibits no distension and no mass. There is no tenderness.  Musculoskeletal: She exhibits edema and tenderness.  There is objective swelling of the left wrist, mild warmth but no redness, no overlying rash.  She has decreased range of motion of the left wrist and hand, she has intact extensor and flexor tendons of the hand on  the left.  She has no pain or swelling around the elbow upper arm or shoulder.  All other extremities with normal range of motion.  She has bilateral lower extremity edema which is symmetrical and chronic  Lymphadenopathy:    She has no cervical adenopathy.  Neurological: She is alert. Coordination normal.  Skin: Skin is warm and dry. No rash noted. No erythema.  Psychiatric: She has a normal mood and affect. Her behavior is normal.  Nursing note and vitals reviewed.    ED Treatments / Results  Labs (all labs ordered are listed, but only abnormal  results are displayed) Labs Reviewed - No data to display  EKG None  Radiology Dg Wrist Complete Left  Result Date: 07/04/2018 CLINICAL DATA:  Left wrist pain since flu shot. EXAM: LEFT WRIST - COMPLETE 3+ VIEW COMPARISON:  None. FINDINGS: Moderate degenerative changes most notably at the carpometacarpal joint of the thumb. No acute bony findings or destructive bony changes. Moderate atherosclerotic calcifications. IMPRESSION: Moderate degenerative changes most significantly at the [facility] joint of the thumb. No acute bony findings. Electronically Signed   By: Rudie Meyer M.D.   On: 07/04/2018 19:12    Procedures Procedures (including critical care time)  Medications Ordered in ED Medications  predniSONE (DELTASONE) tablet 40 mg (40 mg Oral Given 07/04/18 1839)     Initial Impression / Assessment and Plan / ED Course  I have reviewed the triage vital signs and the nursing notes.  Pertinent labs & imaging results that were available during my care of the patient were reviewed by me and considered in my medical decision making (see chart for details).  Clinical Course as of Jul 04 1933  Thu Jul 04, 2018  1858 After reviewing the x-rays of the left wrist myself I see no signs of fractures, dislocations, there is some soft tissue swelling but no other bony abnormalities.   [BM]    Clinical Course User Index [BM] Eber Hong, MD    The patient does not appear in distress, she appears to have some atraumatic swelling of the monoarticular arthritis which would likely be gout given her history.  X-ray pending, prednisone will be ordered, the patient is not a diabetic.  She does have renal insufficiency and I would like to avoid anti-inflammatories if possible in this very pleasant elderly 84 year old female  X-rays read as negative except for some degenerative changes Of the thumb.  Patient updated, placed in a splint for comfort, will prescribe prednisone for 1 week, patient agreeable  Final Clinical Impressions(s) / ED Diagnoses   Final diagnoses:  Acute wrist pain, left    ED Discharge Orders         Ordered    predniSONE (DELTASONE) 20 MG tablet  Daily     07/04/18 1933           Eber Hong, MD 07/04/18 1934

## 2018-07-04 NOTE — ED Triage Notes (Signed)
Pt c/o L arm swelling and pain for several days. Denies hx of gout or fistula in that arm. Noted to have BLE edema. Does take a "fluid pill" with no missed doses.

## 2018-07-05 ENCOUNTER — Ambulatory Visit: Payer: Medicare HMO | Admitting: Cardiovascular Disease

## 2018-07-10 ENCOUNTER — Encounter: Payer: Self-pay | Admitting: *Deleted

## 2018-07-11 ENCOUNTER — Ambulatory Visit: Payer: Medicare HMO | Admitting: Cardiovascular Disease

## 2018-07-11 ENCOUNTER — Encounter: Payer: Self-pay | Admitting: Cardiovascular Disease

## 2018-07-11 VITALS — BP 170/58 | HR 59 | Ht 64.0 in | Wt 154.0 lb

## 2018-07-11 DIAGNOSIS — I10 Essential (primary) hypertension: Secondary | ICD-10-CM

## 2018-07-11 DIAGNOSIS — I429 Cardiomyopathy, unspecified: Secondary | ICD-10-CM | POA: Diagnosis not present

## 2018-07-11 DIAGNOSIS — I82401 Acute embolism and thrombosis of unspecified deep veins of right lower extremity: Secondary | ICD-10-CM

## 2018-07-11 DIAGNOSIS — R600 Localized edema: Secondary | ICD-10-CM | POA: Diagnosis not present

## 2018-07-11 NOTE — Progress Notes (Signed)
SUBJECTIVE: The patient is a 84 year old woman who was diagnosed with nonocclusive DVT in the right popliteal vein suggestive of partiallyrecanalized chronic DVT on 03/20/16. She had been on warfarin.  Additional past medical history includes hypertension, chronic kidney disease, hypothyroidism, and pulmonary hypertension. She also reportedly has a history of a nonischemic cardiomyopathy with EF 25-30% 2007 which improved to 50% in 2008, most recently 45-50% with grade 1 diastolic dysfunction on 05/25/16.  She has chronic nonpitting lower extremity edema. She takes Lasix.  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 Her daughter who is with her, Suzette Battiest, now does this for her.  ECG performed in the office today which I ordered and personally interpreted demonstrates normal sinus rhythm with no ischemic ST segment or T-wave abnormalities, nor any arrhythmias.  There was a nonspecific intraventricular conduction delay.  She has some arthritic pains diffusely but denies exertional chest pain and dyspnea as well as palpitations and leg swelling.    Review of Systems: As per "subjective", otherwise negative.  Allergies  Allergen Reactions  . Aspirin Other (See Comments)    unknown    Current Outpatient Medications  Medication Sig Dispense Refill  . ALPRAZolam (XANAX) 0.5 MG tablet Take 0.5 mg by mouth 2 (two) times daily.    . carvedilol (COREG) 25 MG tablet Take 25 mg by mouth 2 (two) times daily with a meal.     . furosemide (LASIX) 20 MG tablet Take 20 mg by mouth daily. May take an extra tablet for swelling as needed    . gabapentin (NEURONTIN) 100 MG capsule Take 100 mg by mouth 2 (two) times daily.     Marland Kitchen levothyroxine (SYNTHROID, LEVOTHROID) 75 MCG tablet Take 75 mcg by mouth daily.      Marland Kitchen lisinopril (PRINIVIL,ZESTRIL) 20 MG tablet Take 1 tablet by mouth daily.  0  . oxybutynin (DITROPAN) 5 MG tablet Take 5 mg by mouth daily.    . pantoprazole  (PROTONIX) 40 MG tablet Take 1 tablet (40 mg total) by mouth 2 (two) times daily. (Patient taking differently: Take 40 mg by mouth daily. ) 30 tablet 3  . potassium chloride SA (K-DUR,KLOR-CON) 20 MEQ tablet Take 20 mEq by mouth daily.     . vitamin E 400 UNIT capsule Take 400 Units by mouth daily.    Marland Kitchen acetaminophen-codeine (TYLENOL #3) 300-30 MG tablet Take 1 tablet by mouth 2 (two) times daily as needed.     No current facility-administered medications for this visit.     Past Medical History:  Diagnosis Date  . Anemia   . ASD (atrial septal defect)    Small  . Cardiomyopathy    Nonischemic, EF 25-30% 2007 / EF improved 50% 2008, ( catheterization not done) nuclear 2007, fixed anteroapical defect, no ischemia  . Chronic back pain greater than 3 months duration   . CKD (chronic kidney disease)   . Diverticulitis   . Edema    Chronic lower extremity edema... venous insufficiency  . Ejection fraction    EF 50%, echo, 2008  . Gout   . Hypertension   . Hypothyroidism   . Multilevel degenerative disc disease   . Pulmonary hypertension (HCC)   . Stroke [facility])     Past Surgical History:  Procedure Laterality Date  . ABDOMINAL HYSTERECTOMY    . BREAST SURGERY    . CATARACT EXTRACTION      Social History   Socioeconomic History  .  Marital status: Widowed    Spouse name: Not on file  . Number of children: Not on file  . Years of education: Not on file  . Highest education level: Not on file  Occupational History  . Occupation: RETIRED    Employer: RETIRED  Social Needs  . Financial resource strain: Not on file  . Food insecurity:    Worry: Not on file    Inability: Not on file  . Transportation needs:    Medical: Not on file    Non-medical: Not on file  Tobacco Use  . Smoking status: Never Smoker  . Smokeless tobacco: Never Used  Substance and Sexual Activity  . Alcohol use: No  . Drug use: No  . Sexual activity: Not on file  Lifestyle  . Physical activity:     Days per week: Not on file    Minutes per session: Not on file  . Stress: Not on file  Relationships  . Social connections:    Talks on phone: Not on file    Gets together: Not on file    Attends religious service: Not on file    Active member of club or organization: Not on file    Attends meetings of clubs or organizations: Not on file    Relationship status: Not on file  . Intimate partner violence:    Fear of current or ex partner: Not on file    Emotionally abused: Not on file    Physically abused: Not on file    Forced sexual activity: Not on file  Other Topics Concern  . Not on file  Social History Narrative  . Not on file     Vitals:   07/11/18 1308  BP: (!) 170/58  Pulse: (!) 59  SpO2: 96%  Weight: 154 lb (69.9 kg)  Height: 5\' 4"  (1.626 m)    Wt Readings from Last 3 Encounters:  07/11/18 154 lb (69.9 kg)  07/04/18 160 lb (72.6 kg)  06/07/17 154 lb (69.9 kg)     PHYSICAL EXAM General: NAD HEENT: Normal. Neck: No JVD, no thyromegaly. Lungs: Clear to auscultation bilaterally with normal respiratory effort. CV: Regular rate and rhythm, normal S1/S2, no S3/S4, soft systolic murmur at left lower sternal border. Nonpitting lower extremity edema bilaterally.    Abdomen: Soft, nontender, no distention.  Neurologic: Alert and oriented.  Psych: Normal affect. Skin: Normal. Musculoskeletal: No gross deformities.    ECG: Reviewed above under Subjective   Labs: Lab Results  Component Value Date/Time   K 3.4 (L) 03/22/2016 04:20 AM   BUN 20 03/22/2016 04:20 AM   CREATININE 1.23 (H) 03/22/2016 04:20 AM   ALT 7 (L) 03/21/2016 06:43 AM   TSH 1.170 02/10/2015 10:37 AM   TSH 0.073 (L) 09/07/2014 11:23 AM   HGB 10.5 (L) 03/24/2016 04:07 AM     Lipids: No results found for: LDLCALC, LDLDIRECT, CHOL, TRIG, HDL     ASSESSMENT AND PLAN:  1. Right leg DVT: No longer on warfarin. Stable.  2. Nonischemic cardiomyopathy: Symptomatically stable without signs of  heart failure. Continue Coreg and lisinopril. EF 25-30% 2007 which improved to 50% in 2008, most recently 45-50% with grade 1 diastolic dysfunction on 05/25/16.  3. Bilateral leg edema: Continue Lasix. I previously encouraged leg elevation.    4.  Hypertension: Blood pressure is significantly elevated today.  This will need further monitoring.  I will not make any adjustments today.   Disposition: Follow up 1 year   Prentice Docker,  M.D., F.A.C.C.

## 2018-07-11 NOTE — Patient Instructions (Signed)

## 2018-08-19 DIAGNOSIS — I10 Essential (primary) hypertension: Secondary | ICD-10-CM | POA: Diagnosis not present

## 2018-08-19 DIAGNOSIS — Z0001 Encounter for general adult medical examination with abnormal findings: Secondary | ICD-10-CM | POA: Diagnosis not present

## 2018-08-19 DIAGNOSIS — Z1331 Encounter for screening for depression: Secondary | ICD-10-CM | POA: Diagnosis not present

## 2018-08-19 DIAGNOSIS — I5022 Chronic systolic (congestive) heart failure: Secondary | ICD-10-CM | POA: Diagnosis not present

## 2018-08-19 DIAGNOSIS — Z1389 Encounter for screening for other disorder: Secondary | ICD-10-CM | POA: Diagnosis not present

## 2018-08-19 DIAGNOSIS — I2510 Atherosclerotic heart disease of native coronary artery without angina pectoris: Secondary | ICD-10-CM | POA: Diagnosis not present

## 2018-09-09 DIAGNOSIS — R600 Localized edema: Secondary | ICD-10-CM | POA: Diagnosis not present

## 2018-09-09 DIAGNOSIS — I8290 Acute embolism and thrombosis of unspecified vein: Secondary | ICD-10-CM | POA: Diagnosis not present

## 2018-09-09 DIAGNOSIS — E039 Hypothyroidism, unspecified: Secondary | ICD-10-CM | POA: Diagnosis not present

## 2018-09-09 DIAGNOSIS — I10 Essential (primary) hypertension: Secondary | ICD-10-CM | POA: Diagnosis not present

## 2018-09-09 DIAGNOSIS — I2510 Atherosclerotic heart disease of native coronary artery without angina pectoris: Secondary | ICD-10-CM | POA: Diagnosis not present

## 2018-09-09 DIAGNOSIS — I5022 Chronic systolic (congestive) heart failure: Secondary | ICD-10-CM | POA: Diagnosis not present

## 2018-09-09 DIAGNOSIS — R269 Unspecified abnormalities of gait and mobility: Secondary | ICD-10-CM | POA: Diagnosis not present

## 2018-09-09 DIAGNOSIS — M1990 Unspecified osteoarthritis, unspecified site: Secondary | ICD-10-CM | POA: Diagnosis not present

## 2018-09-09 DIAGNOSIS — E785 Hyperlipidemia, unspecified: Secondary | ICD-10-CM | POA: Diagnosis not present

## 2018-11-05 DIAGNOSIS — G8929 Other chronic pain: Secondary | ICD-10-CM | POA: Diagnosis not present

## 2018-11-05 DIAGNOSIS — R69 Illness, unspecified: Secondary | ICD-10-CM | POA: Diagnosis not present

## 2018-11-05 DIAGNOSIS — M1990 Unspecified osteoarthritis, unspecified site: Secondary | ICD-10-CM | POA: Diagnosis not present

## 2018-11-05 DIAGNOSIS — N189 Chronic kidney disease, unspecified: Secondary | ICD-10-CM | POA: Diagnosis not present

## 2018-11-05 DIAGNOSIS — I130 Hypertensive heart and chronic kidney disease with heart failure and stage 1 through stage 4 chronic kidney disease, or unspecified chronic kidney disease: Secondary | ICD-10-CM | POA: Diagnosis not present

## 2018-11-05 DIAGNOSIS — G629 Polyneuropathy, unspecified: Secondary | ICD-10-CM | POA: Diagnosis not present

## 2018-11-05 DIAGNOSIS — Z8249 Family history of ischemic heart disease and other diseases of the circulatory system: Secondary | ICD-10-CM | POA: Diagnosis not present

## 2018-11-05 DIAGNOSIS — R32 Unspecified urinary incontinence: Secondary | ICD-10-CM | POA: Diagnosis not present

## 2018-11-05 DIAGNOSIS — E039 Hypothyroidism, unspecified: Secondary | ICD-10-CM | POA: Diagnosis not present

## 2018-11-05 DIAGNOSIS — I509 Heart failure, unspecified: Secondary | ICD-10-CM | POA: Diagnosis not present

## 2018-11-18 DIAGNOSIS — E039 Hypothyroidism, unspecified: Secondary | ICD-10-CM | POA: Diagnosis not present

## 2018-11-18 DIAGNOSIS — I5022 Chronic systolic (congestive) heart failure: Secondary | ICD-10-CM | POA: Diagnosis not present

## 2018-11-18 DIAGNOSIS — I2510 Atherosclerotic heart disease of native coronary artery without angina pectoris: Secondary | ICD-10-CM | POA: Diagnosis not present

## 2018-11-18 DIAGNOSIS — I10 Essential (primary) hypertension: Secondary | ICD-10-CM | POA: Diagnosis not present

## 2019-01-03 DEATH — deceased

## 2019-06-20 IMAGING — DX DG WRIST COMPLETE 3+V*L*
4 series · 4 of 4 positions shown · non-contrast
Comparison: None.

CLINICAL DATA: Left wrist pain since flu shot.

EXAM:
LEFT WRIST - COMPLETE 3+ VIEW

[wrist pa]
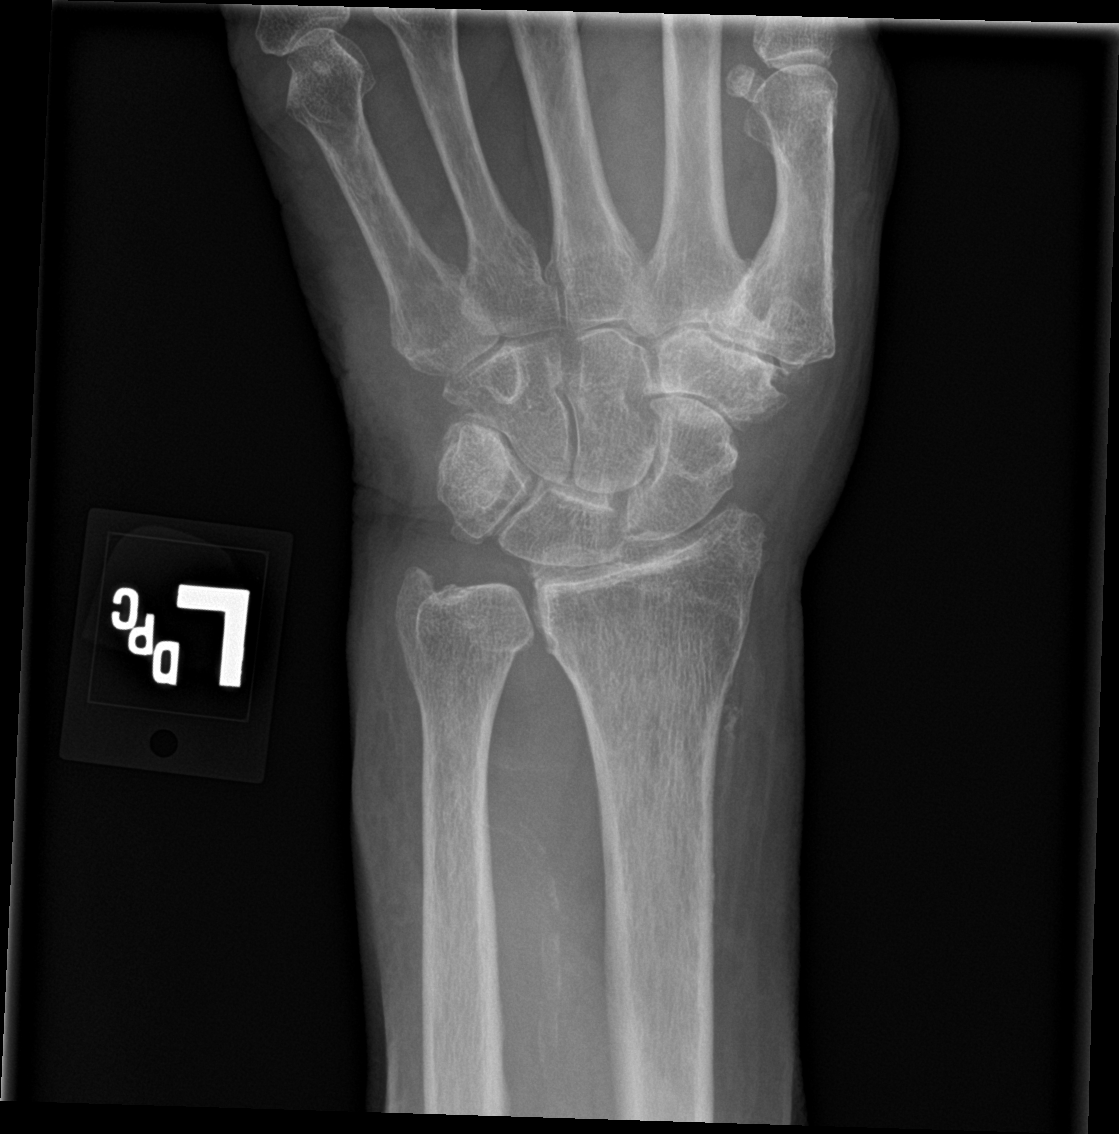

[wrist obl]
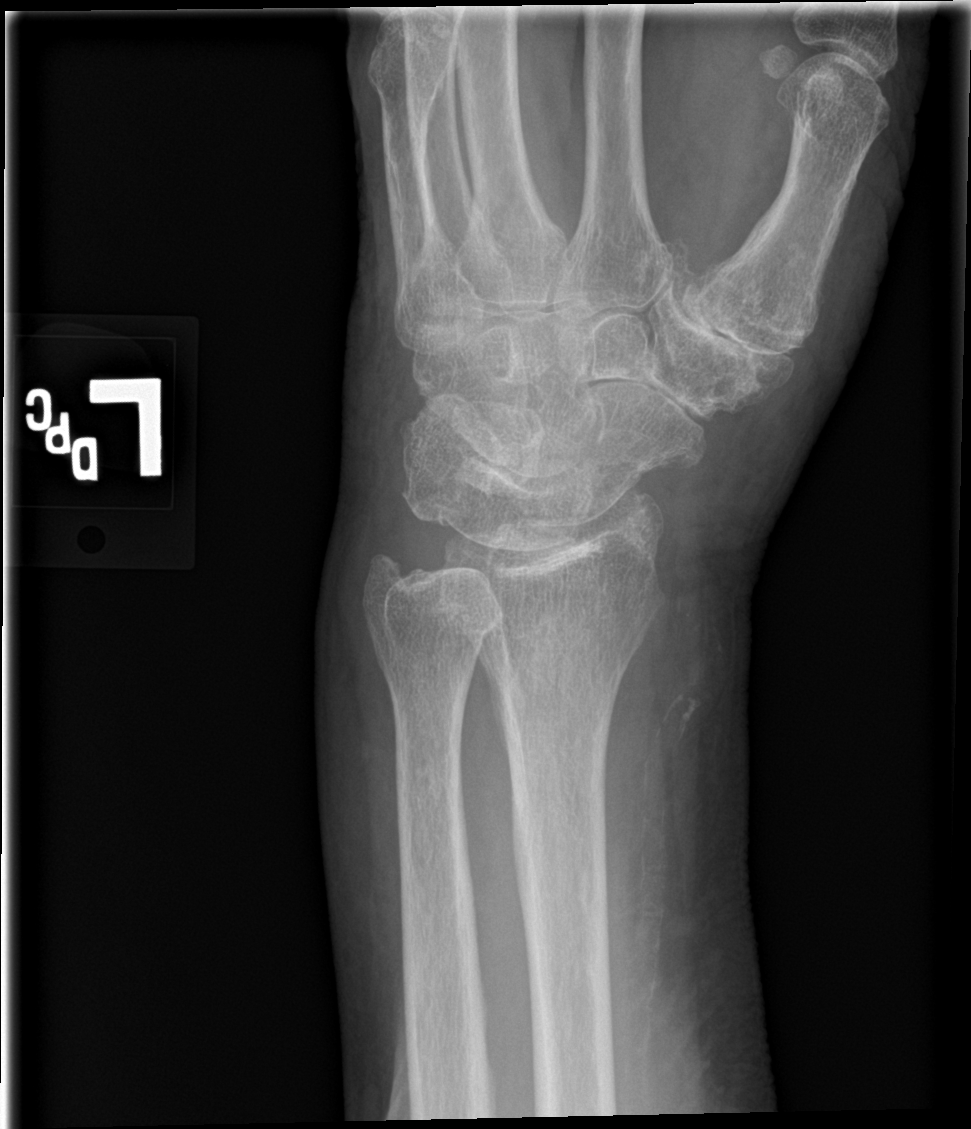

[wrist lat]
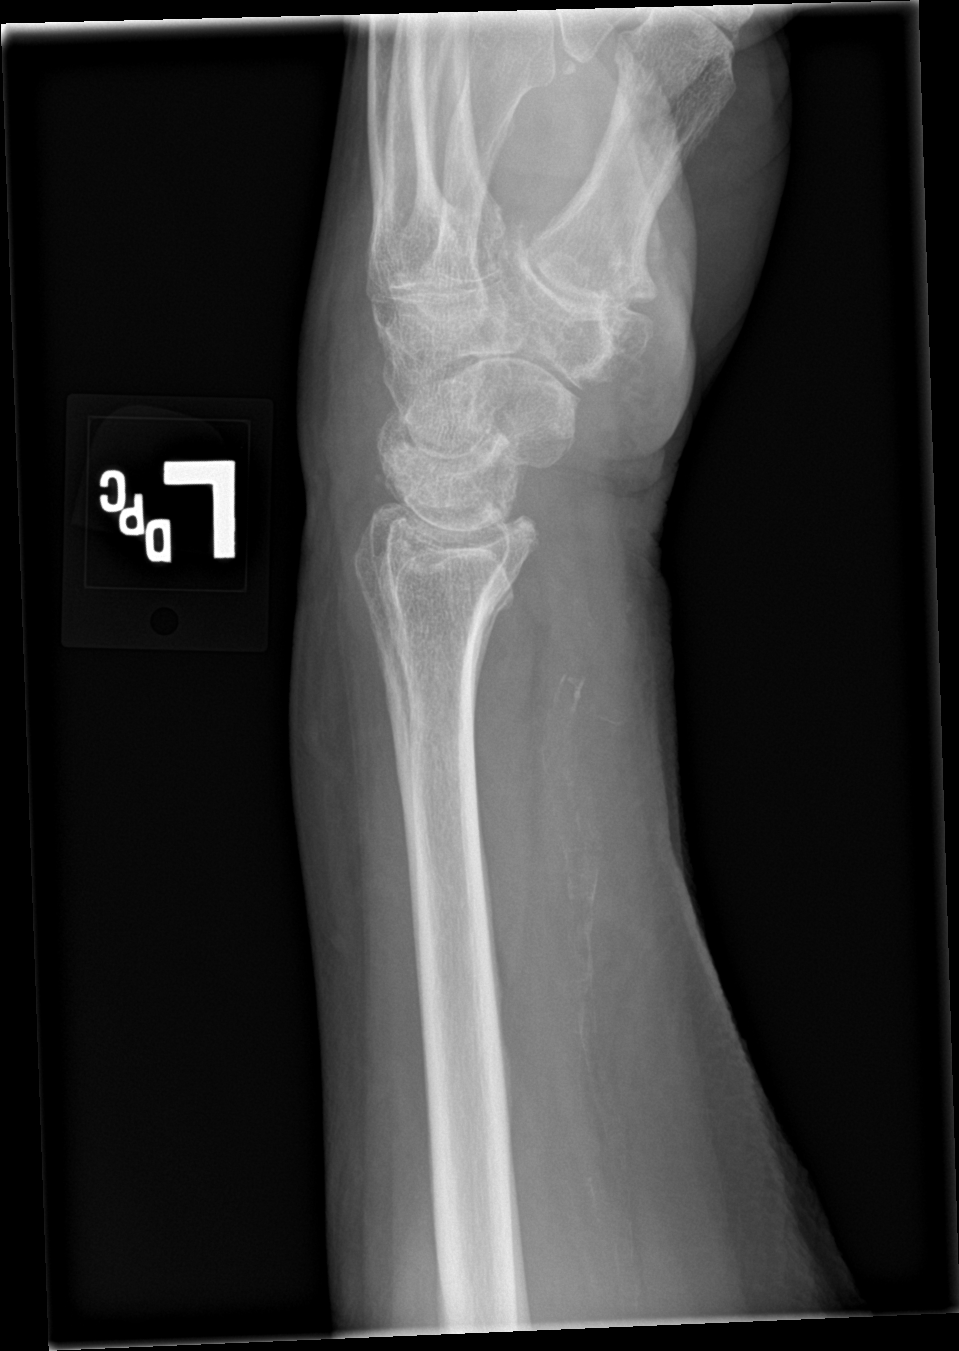

[wrist navicular]
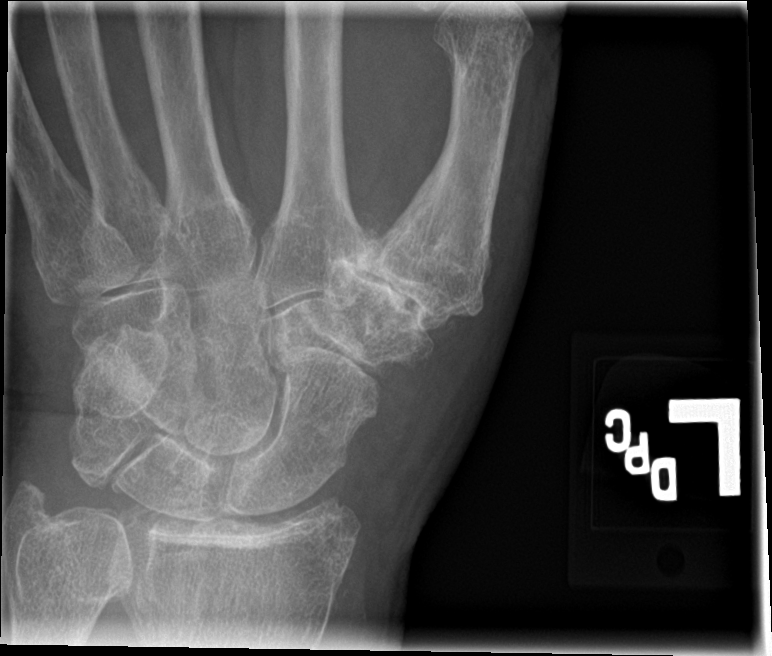

[4 of 4 positions shown; findings below may reference images not displayed]

FINDINGS: Moderate degenerative changes most notably at the carpometacarpal
joint of the thumb. No acute bony findings or destructive bony
changes. Moderate atherosclerotic calcifications.
IMPRESSION: Moderate degenerative changes most significantly at the [facility] joint of
the thumb.

No acute bony findings.
